# Patient Record
Sex: Female | Born: 1945 | Race: White | Hispanic: No | Marital: Married | Smoking: Former smoker
Health system: Southern US, Community
[De-identification: ages and names within clinical notes are randomized; demographics above are authoritative.]

## PROBLEM LIST (undated history)

## (undated) DIAGNOSIS — Z8719 Personal history of other diseases of the digestive system: Secondary | ICD-10-CM

## (undated) DIAGNOSIS — K635 Polyp of colon: Secondary | ICD-10-CM

## (undated) DIAGNOSIS — K5790 Diverticulosis of intestine, part unspecified, without perforation or abscess without bleeding: Secondary | ICD-10-CM

## (undated) DIAGNOSIS — B159 Hepatitis A without hepatic coma: Secondary | ICD-10-CM

## (undated) DIAGNOSIS — I4891 Unspecified atrial fibrillation: Secondary | ICD-10-CM

## (undated) DIAGNOSIS — K760 Fatty (change of) liver, not elsewhere classified: Secondary | ICD-10-CM

## (undated) DIAGNOSIS — M1990 Unspecified osteoarthritis, unspecified site: Secondary | ICD-10-CM

## (undated) DIAGNOSIS — I10 Essential (primary) hypertension: Secondary | ICD-10-CM

## (undated) DIAGNOSIS — K219 Gastro-esophageal reflux disease without esophagitis: Secondary | ICD-10-CM

## (undated) DIAGNOSIS — M109 Gout, unspecified: Secondary | ICD-10-CM

## (undated) DIAGNOSIS — E039 Hypothyroidism, unspecified: Secondary | ICD-10-CM

## (undated) DIAGNOSIS — K8020 Calculus of gallbladder without cholecystitis without obstruction: Secondary | ICD-10-CM

## (undated) DIAGNOSIS — E785 Hyperlipidemia, unspecified: Secondary | ICD-10-CM

## (undated) DIAGNOSIS — E669 Obesity, unspecified: Secondary | ICD-10-CM

## (undated) HISTORY — DX: Hypothyroidism, unspecified: E03.9

## (undated) HISTORY — PX: PLANTAR FASCIA SURGERY: SHX746

## (undated) HISTORY — PX: ERCP: SHX60

## (undated) HISTORY — DX: Calculus of gallbladder without cholecystitis without obstruction: K80.20

## (undated) HISTORY — DX: Hyperlipidemia, unspecified: E78.5

## (undated) HISTORY — DX: Diverticulosis of intestine, part unspecified, without perforation or abscess without bleeding: K57.90

## (undated) HISTORY — PX: TOENAIL EXCISION: SUR558

## (undated) HISTORY — PX: CHOLECYSTECTOMY: SHX55

## (undated) HISTORY — PX: TOTAL ABDOMINAL HYSTERECTOMY: SHX209

## (undated) HISTORY — PX: BREAST BIOPSY: SHX20

## (undated) HISTORY — DX: Obesity, unspecified: E66.9

## (undated) HISTORY — DX: Fatty (change of) liver, not elsewhere classified: K76.0

## (undated) HISTORY — DX: Unspecified atrial fibrillation: I48.91

## (undated) HISTORY — PX: TONSILLECTOMY: SUR1361

## (undated) HISTORY — DX: Essential (primary) hypertension: I10

## (undated) HISTORY — DX: Gout, unspecified: M10.9

## (undated) HISTORY — DX: Unspecified osteoarthritis, unspecified site: M19.90

## (undated) HISTORY — DX: Polyp of colon: K63.5

## (undated) HISTORY — DX: Hepatitis a without hepatic coma: B15.9

## (undated) HISTORY — DX: Gastro-esophageal reflux disease without esophagitis: K21.9

---

## 1963-09-13 HISTORY — PX: OTHER SURGICAL HISTORY: SHX169

## 1998-05-21 ENCOUNTER — Other Ambulatory Visit: Admission: RE | Admit: 1998-05-21 | Discharge: 1998-05-21 | Payer: Self-pay | Admitting: Internal Medicine

## 1999-08-13 ENCOUNTER — Other Ambulatory Visit: Admission: RE | Admit: 1999-08-13 | Discharge: 1999-08-13 | Payer: Self-pay | Admitting: Internal Medicine

## 1999-09-16 ENCOUNTER — Other Ambulatory Visit: Admission: RE | Admit: 1999-09-16 | Discharge: 1999-09-24 | Payer: Self-pay | Admitting: *Deleted

## 2000-06-06 ENCOUNTER — Encounter: Admission: RE | Admit: 2000-06-06 | Discharge: 2000-06-06 | Payer: Self-pay | Admitting: Occupational Medicine

## 2000-06-06 ENCOUNTER — Encounter: Payer: Self-pay | Admitting: Occupational Medicine

## 2000-07-24 ENCOUNTER — Encounter: Payer: Self-pay | Admitting: General Surgery

## 2000-07-24 ENCOUNTER — Encounter: Admission: RE | Admit: 2000-07-24 | Discharge: 2000-07-24 | Payer: Self-pay | Admitting: General Surgery

## 2000-08-02 ENCOUNTER — Other Ambulatory Visit: Admission: RE | Admit: 2000-08-02 | Discharge: 2000-08-02 | Payer: Self-pay | Admitting: Internal Medicine

## 2001-01-03 ENCOUNTER — Encounter: Admission: RE | Admit: 2001-01-03 | Discharge: 2001-01-03 | Payer: Self-pay | Admitting: General Surgery

## 2001-01-03 ENCOUNTER — Encounter: Payer: Self-pay | Admitting: General Surgery

## 2001-07-19 ENCOUNTER — Encounter: Payer: Self-pay | Admitting: General Surgery

## 2001-07-19 ENCOUNTER — Encounter: Admission: RE | Admit: 2001-07-19 | Discharge: 2001-07-19 | Payer: Self-pay | Admitting: General Surgery

## 2002-07-30 ENCOUNTER — Encounter: Admission: RE | Admit: 2002-07-30 | Discharge: 2002-07-30 | Payer: Self-pay | Admitting: Internal Medicine

## 2002-07-30 ENCOUNTER — Encounter: Payer: Self-pay | Admitting: Internal Medicine

## 2003-09-23 ENCOUNTER — Encounter: Admission: RE | Admit: 2003-09-23 | Discharge: 2003-09-23 | Payer: Self-pay | Admitting: General Surgery

## 2004-09-28 ENCOUNTER — Encounter: Admission: RE | Admit: 2004-09-28 | Discharge: 2004-09-28 | Payer: Self-pay | Admitting: General Surgery

## 2005-09-30 ENCOUNTER — Encounter: Admission: RE | Admit: 2005-09-30 | Discharge: 2005-09-30 | Payer: Self-pay | Admitting: General Surgery

## 2006-10-02 ENCOUNTER — Encounter: Admission: RE | Admit: 2006-10-02 | Discharge: 2006-10-02 | Payer: Self-pay | Admitting: Internal Medicine

## 2007-01-03 ENCOUNTER — Ambulatory Visit: Payer: Self-pay | Admitting: Internal Medicine

## 2007-01-08 ENCOUNTER — Ambulatory Visit: Payer: Self-pay | Admitting: Gastroenterology

## 2007-10-31 ENCOUNTER — Encounter: Admission: RE | Admit: 2007-10-31 | Discharge: 2007-10-31 | Payer: Self-pay | Admitting: Internal Medicine

## 2008-11-12 ENCOUNTER — Encounter: Admission: RE | Admit: 2008-11-12 | Discharge: 2008-11-12 | Payer: Self-pay | Admitting: Internal Medicine

## 2009-04-16 DIAGNOSIS — E039 Hypothyroidism, unspecified: Secondary | ICD-10-CM | POA: Insufficient documentation

## 2009-11-13 ENCOUNTER — Encounter: Admission: RE | Admit: 2009-11-13 | Discharge: 2009-11-13 | Payer: Self-pay | Admitting: Internal Medicine

## 2010-12-01 ENCOUNTER — Other Ambulatory Visit: Payer: Self-pay | Admitting: Internal Medicine

## 2010-12-01 DIAGNOSIS — Z1231 Encounter for screening mammogram for malignant neoplasm of breast: Secondary | ICD-10-CM

## 2010-12-09 ENCOUNTER — Ambulatory Visit
Admission: RE | Admit: 2010-12-09 | Discharge: 2010-12-09 | Disposition: A | Discharge status: Home / Self Care | Payer: Medicare Other | Source: Ambulatory Visit | Attending: Internal Medicine | Admitting: Internal Medicine

## 2010-12-09 DIAGNOSIS — Z1231 Encounter for screening mammogram for malignant neoplasm of breast: Secondary | ICD-10-CM

## 2010-12-30 ENCOUNTER — Encounter: Payer: Self-pay | Admitting: Cardiology

## 2010-12-30 DIAGNOSIS — E785 Hyperlipidemia, unspecified: Secondary | ICD-10-CM | POA: Insufficient documentation

## 2010-12-30 DIAGNOSIS — E669 Obesity, unspecified: Secondary | ICD-10-CM | POA: Insufficient documentation

## 2010-12-30 DIAGNOSIS — K219 Gastro-esophageal reflux disease without esophagitis: Secondary | ICD-10-CM | POA: Insufficient documentation

## 2010-12-30 DIAGNOSIS — I482 Chronic atrial fibrillation, unspecified: Secondary | ICD-10-CM | POA: Insufficient documentation

## 2010-12-30 DIAGNOSIS — I10 Essential (primary) hypertension: Secondary | ICD-10-CM | POA: Insufficient documentation

## 2011-01-03 ENCOUNTER — Ambulatory Visit (INDEPENDENT_AMBULATORY_CARE_PROVIDER_SITE_OTHER): Payer: Medicare Other | Admitting: Cardiology

## 2011-01-03 ENCOUNTER — Encounter: Payer: Self-pay | Admitting: Cardiology

## 2011-01-03 VITALS — BP 120/80 | HR 66 | Wt 266.0 lb

## 2011-01-03 DIAGNOSIS — E7800 Pure hypercholesterolemia, unspecified: Secondary | ICD-10-CM

## 2011-01-03 DIAGNOSIS — IMO0002 Reserved for concepts with insufficient information to code with codable children: Secondary | ICD-10-CM

## 2011-01-03 DIAGNOSIS — I4891 Unspecified atrial fibrillation: Secondary | ICD-10-CM

## 2011-01-03 DIAGNOSIS — I482 Chronic atrial fibrillation, unspecified: Secondary | ICD-10-CM

## 2011-01-03 DIAGNOSIS — I10 Essential (primary) hypertension: Secondary | ICD-10-CM

## 2011-01-03 NOTE — Patient Instructions (Signed)
Continue your current medication.  We will see you back in 1 year for a follow up visit.

## 2011-01-03 NOTE — Assessment & Plan Note (Signed)
Blood pressure is well-controlled on current therapy.

## 2011-01-03 NOTE — Assessment & Plan Note (Signed)
She is on appropriate therapy with pravastatin. Lipid levels are followed by Dr. Barney Drain.

## 2011-01-03 NOTE — Assessment & Plan Note (Signed)
She has chronic persistent atrial fibrillation. Her rate is well controlled and she is appropriately anticoagulated. Will continue with her current therapy and followup again in one year.

## 2011-01-03 NOTE — Progress Notes (Signed)
   [name] [name] Date of Birth: 1946/08/03   History of Present Illness: [name] [name] is seen for yearly followup with a history of chronic atrial fibrillation. She has been managed with rate control and chronic anticoagulation. She states she has done very well this past year. She denies any palpitations, dizziness, chest pain, or dyspnea. Dr. Karren Burly has been monitoring her Coumadin.  Current Outpatient Prescriptions on File Prior to Visit  Medication Sig Dispense Refill  . Cholecalciferol (VITAMIN D) 2000 UNITS CAPS Take by mouth daily.        Mariane Baumgarten Calcium (STOOL SOFTENER PO) Take by mouth 2 (two) times daily.        Marland Kitchen estropipate (OGEN) 0.75 MG tablet Take 0.75 mg by mouth daily.        Marland Kitchen levothyroxine (SYNTHROID, LEVOTHROID) 112 MCG tablet Take 112 mcg by mouth daily.        Marland Kitchen lisinopril-hydrochlorothiazide (PRINZIDE,ZESTORETIC) 20-12.5 MG per tablet Take 1 tablet by mouth 2 (two) times daily.        . methocarbamol (ROBAXIN) 500 MG tablet Take 500 mg by mouth 2 (two) times daily.        . pravastatin (PRAVACHOL) 40 MG tablet Take 40 mg by mouth daily.        . ranitidine (ZANTAC) 300 MG tablet Take 300 mg by mouth at bedtime.        Marland Kitchen warfarin (COUMADIN) 5 MG tablet Take 5 mg by mouth daily. AS DIRECTED         No Known Allergies  Past Medical History  Diagnosis Date  . Hypertension   . Dyslipidemia   . Obesity   . GERD (gastroesophageal reflux disease)   . AF (atrial fibrillation)     History reviewed. No pertinent past surgical history.  History  Smoking status  . Former Smoker  . Quit date: 09/12/1998  Smokeless tobacco  . Not on file    History  Alcohol Use No    Family History  Problem Relation Age of Onset  . Diabetes Mother   . Stroke Mother   . Heart disease Father   . Arrhythmia Sister   . Heart disease Sister     Review of Systems: All other systems were reviewed and are negative.  Physical Exam: BP 120/80  Pulse 66  Wt 266 lb  (120.657 kg) She is an obese white female in no acute distress. HEENT exam is unremarkable. She has no JVD or bruits. Lungs are clear. Cardiac exam reveals an irregular rate and rhythm without gallop, murmur, or click. She has no edema. Pedal pulses are good. LABORATORY DATA: ECG today demonstrates atrial fibrillation with a rate of 75 beats per minute. She has nonspecific T-wave abnormality.  Assessment / Plan:

## 2011-01-28 NOTE — Assessment & Plan Note (Signed)
[facility] OFFICE NOTE   NAME:[name] [name] [name]                        MRN:          [id number]  DATE:01/03/2007                            DOB:          14-Dec-1945    The patient is self referred.   REASON FOR CONSULTATION:  Recent problems with nausea and vomiting.  Concerned about recurrent common bile duct stone.   HISTORY:  This is a 65 year old white female with a history of  hypertension, hypothyroidism, degenerative joint disease, morbid  obesity, choledocholithiasis status post cholecystectomy, as well as  ERCP with sphincterotomy, gastroesophageal reflux disease, and chronic  elevation of hepatic transaminases, felt likely due to fatty liver  disease (after extensive previous workup).  She has not been seen in  this office for over 4 years.  She was last seen in early 2004 regarding  reflux disease, heme-occult positive stool and elevated liver test.  See  that dictation for details.  The last upper endoscopy revealed mild  esophagitis.  Her last colonoscopy revealed hyperplastic polyp and mild  diverticulosis only.  She reports that she has been in her usual state  of health, until Easter when she developed problems with nausea and  vomiting lasting one to two days.  She saw Dr. Shon Baton and was given  a shot of Phenergan.  This helped.  Over the next week, she felt sort of  fatigued and washed out.  She was concerned that maybe this represents  problems with recurrent common duct stone, as she had some problems with  nausea and vomiting at that time.  However, during the course of this  particular problem, she denied fever, jaundice, darkening of urine or  any abdominal pain.  She had no diarrhea with the illness.  No bleeding.  The past several weeks she has been fine without symptoms.  Her reflux  disease is well controlled with Nexium.  She did have chronic  constipation for which she takes  stool softener.   PAST MEDICAL HISTORY:  As above.   PAST SURGICAL HISTORY:  1. Cholecystectomy  2. Hysterectomy.  3. Left knee surgery.  4. Cervical cyst removal.  5. Tonsillectomy.  6. Left foot surgery.   ALLERGIES:  UNSPECIFIED itching pill which causes drowsiness, also  Band-Aids.   CURRENT MEDICATIONS:  1. Lisinopril/hydrochlorothiazide 1 p.o. b.i.d.  2. Premarin 1.25 mg daily.  3. Stool softener.  4. Multi-vitamin.  5. Nexium 40 mg daily.  6. Synthroid 112 mcg daily.  7. Unspecified cholesterol-lowering agent.   FAMILY HISTORY:  Negative for gastrointestinal malignancy or liver  disease.   SOCIAL HISTORY:  Patient is married without children, has a high school  education, lives with her husband, does not smoke or use alcohol.   REVIEW OF SYSTEMS:  Per diagnostic evaluation performed.   PHYSICAL EXAMINATION:  GENERAL:  Patient is alert and oriented and in no  acute distress.  VITAL SIGNS:  Her blood pressure is 146/70, heart rate is 52 with an  occasional irregular beat.  Her weight is 264.8 pounds.  She is 5 feet 5  inches  in height.  HEENT:  Sclerae anicteric, conjunctivae are pink, oral mucosa intact, no  adenopathy.  LUNGS:  Clear.  HEART:  Regular.  ABDOMEN:  Obese and soft without tenderness, mass or hernia.  Good bowel  sounds heard.  EXTREMITIES:  Without edema.   LABORATORY:  Blood work obtained November 17, 2006, revealed essentially  normal comprehensive metabolic and CBC.  As well, her lipids were  unremarkable.   IMPRESSION:  1. Prior problems with acute nausea and vomiting, most likely viral      gastroenteritis, resolved.  2. History of elevated liver function test, presumably due to fatty      liver.  Extensive previous workup.Recent Liver tests were normal.  3. Gastroesophageal reflux disease.  Asymptomatic, on Nexium.  4. History of choledocholithiasis.  Doubt this represents a      recurrence,though the patient is concerned.    RECOMMENDATIONS:  1. Schedule abdominal  ultrasound to rule out biliary dilation or      recurrent stone.  If the exam is negative, then follow expectantly.      She will return to the general medical care of Dr. Osborne Casco.     Docia Chuck. Henrene Pastor, MD  Electronically Signed    JNP/MedQ  DD: 01/03/2007  DT: 01/03/2007  Job #: [id number]   cc:   [name] [name], M.D.

## 2011-11-04 ENCOUNTER — Other Ambulatory Visit: Payer: Self-pay | Admitting: Internal Medicine

## 2011-11-04 DIAGNOSIS — Z1231 Encounter for screening mammogram for malignant neoplasm of breast: Secondary | ICD-10-CM

## 2011-12-12 ENCOUNTER — Ambulatory Visit
Admission: RE | Admit: 2011-12-12 | Discharge: 2011-12-12 | Disposition: A | Discharge status: Home / Self Care | Payer: Medicare Other | Source: Ambulatory Visit | Attending: Internal Medicine | Admitting: Internal Medicine

## 2011-12-12 DIAGNOSIS — Z1231 Encounter for screening mammogram for malignant neoplasm of breast: Secondary | ICD-10-CM

## 2011-12-13 ENCOUNTER — Other Ambulatory Visit: Payer: Self-pay | Admitting: Internal Medicine

## 2011-12-13 DIAGNOSIS — R928 Other abnormal and inconclusive findings on diagnostic imaging of breast: Secondary | ICD-10-CM

## 2011-12-16 ENCOUNTER — Ambulatory Visit
Admission: RE | Admit: 2011-12-16 | Discharge: 2011-12-16 | Disposition: A | Discharge status: Home / Self Care | Payer: Medicare Other | Source: Ambulatory Visit | Attending: Internal Medicine | Admitting: Internal Medicine

## 2011-12-16 DIAGNOSIS — R928 Other abnormal and inconclusive findings on diagnostic imaging of breast: Secondary | ICD-10-CM

## 2011-12-27 ENCOUNTER — Encounter: Payer: Self-pay | Admitting: Cardiology

## 2011-12-29 ENCOUNTER — Encounter: Payer: Self-pay | Admitting: Cardiology

## 2011-12-29 ENCOUNTER — Ambulatory Visit (INDEPENDENT_AMBULATORY_CARE_PROVIDER_SITE_OTHER): Payer: Medicare Other | Admitting: Cardiology

## 2011-12-29 VITALS — BP 130/72 | HR 75 | Ht 65.0 in | Wt 264.0 lb

## 2011-12-29 DIAGNOSIS — Z7901 Long term (current) use of anticoagulants: Secondary | ICD-10-CM

## 2011-12-29 DIAGNOSIS — I10 Essential (primary) hypertension: Secondary | ICD-10-CM

## 2011-12-29 DIAGNOSIS — I4891 Unspecified atrial fibrillation: Secondary | ICD-10-CM

## 2011-12-29 NOTE — Assessment & Plan Note (Signed)
She has permanent atrial fibrillation. Her rate is controlled and she is completely asymptomatic. Continue on her current therapy with anticoagulation on Coumadin. We did discuss the newer anticoagulants but given how well she has been on Coumadin I see no reason to change.

## 2011-12-29 NOTE — Assessment & Plan Note (Signed)
Well-controlled on current medications 

## 2011-12-29 NOTE — Progress Notes (Signed)
   [name] [name] Date of Birth: 1945-10-13   History of Present Illness: [name] [name] is seen for yearly followup. Rate control and anticoagulation with Coumadin. He states that her INRs are always perfect. She denies any bleeding problems. He has had no TIA or CVA symptoms. Denies any palpitations, dizziness, shortness of breath, or chest pain. She is planning to start a weight watchers program about a week.  Current Outpatient Prescriptions on File Prior to Visit  Medication Sig Dispense Refill  . Cholecalciferol (VITAMIN D) 2000 UNITS CAPS Take by mouth daily.        Mariane Baumgarten Calcium (STOOL SOFTENER PO) Take by mouth 2 (two) times daily.        Marland Kitchen estropipate (OGEN) 0.75 MG tablet Take 0.75 mg by mouth daily.        Marland Kitchen levothyroxine (SYNTHROID, LEVOTHROID) 112 MCG tablet Take 112 mcg by mouth daily.        Marland Kitchen lisinopril-hydrochlorothiazide (PRINZIDE,ZESTORETIC) 20-12.5 MG per tablet Take 1 tablet by mouth 2 (two) times daily.        . methocarbamol (ROBAXIN) 500 MG tablet Take 500 mg by mouth 2 (two) times daily.        . pravastatin (PRAVACHOL) 40 MG tablet Take 40 mg by mouth daily.        . ranitidine (ZANTAC) 300 MG tablet Take 300 mg by mouth at bedtime.        Marland Kitchen warfarin (COUMADIN) 5 MG tablet Take 5 mg by mouth daily. AS DIRECTED         No Known Allergies  Past Medical History  Diagnosis Date  . Hypertension   . Dyslipidemia   . Obesity   . GERD (gastroesophageal reflux disease)   . AF (atrial fibrillation)     History reviewed. No pertinent past surgical history.  History  Smoking status  . Former Smoker  . Quit date: 09/12/1998  Smokeless tobacco  . Not on file    History  Alcohol Use No    Family History  Problem Relation Age of Onset  . Diabetes Mother   . Stroke Mother   . Heart disease Father   . Arrhythmia Sister   . Heart disease Sister     Review of Systems: As noted in history of present illness. All other systems were reviewed and are  negative.  Physical Exam: BP 130/72  Pulse 75  Ht 5\' 5"  (1.651 m)  Wt 264 lb (119.75 kg)  BMI 43.93 kg/m2 She is an obese white female in no acute distress. HEENT exam is unremarkable. She has no JVD or bruits. Lungs are clear. Cardiac exam reveals an irregular rate and rhythm without gallop, murmur, or click. She has no edema. Pedal pulses are good. LABORATORY DATA: ECG today demonstrates atrial fibrillation with a rate of 75 beats per minute. There is low QRS voltage. She has nonspecific T-wave abnormality.  Assessment / Plan:

## 2011-12-29 NOTE — Patient Instructions (Signed)
Continue your current therapy.  I will see you in 1 year.

## 2012-05-08 ENCOUNTER — Other Ambulatory Visit: Payer: Self-pay | Admitting: Internal Medicine

## 2012-05-08 DIAGNOSIS — R921 Mammographic calcification found on diagnostic imaging of breast: Secondary | ICD-10-CM

## 2012-06-07 ENCOUNTER — Other Ambulatory Visit: Payer: Self-pay | Admitting: Surgery

## 2012-06-18 ENCOUNTER — Encounter: Payer: Self-pay | Admitting: *Deleted

## 2012-06-20 ENCOUNTER — Other Ambulatory Visit: Payer: Self-pay | Admitting: Internal Medicine

## 2012-06-20 ENCOUNTER — Ambulatory Visit
Admission: RE | Admit: 2012-06-20 | Discharge: 2012-06-20 | Disposition: A | Discharge status: Home / Self Care | Payer: Medicare Other | Source: Ambulatory Visit | Attending: Internal Medicine | Admitting: Internal Medicine

## 2012-06-20 DIAGNOSIS — R921 Mammographic calcification found on diagnostic imaging of breast: Secondary | ICD-10-CM

## 2012-06-29 ENCOUNTER — Ambulatory Visit
Admission: RE | Admit: 2012-06-29 | Discharge: 2012-06-29 | Disposition: A | Discharge status: Home / Self Care | Payer: Medicare Other | Source: Ambulatory Visit | Attending: Internal Medicine | Admitting: Internal Medicine

## 2012-06-29 ENCOUNTER — Other Ambulatory Visit: Payer: Self-pay | Admitting: Internal Medicine

## 2012-06-29 DIAGNOSIS — R921 Mammographic calcification found on diagnostic imaging of breast: Secondary | ICD-10-CM

## 2012-11-23 ENCOUNTER — Other Ambulatory Visit: Payer: Self-pay | Admitting: Internal Medicine

## 2012-11-23 DIAGNOSIS — R921 Mammographic calcification found on diagnostic imaging of breast: Secondary | ICD-10-CM

## 2012-12-18 ENCOUNTER — Ambulatory Visit
Admission: RE | Admit: 2012-12-18 | Discharge: 2012-12-18 | Disposition: A | Discharge status: Home / Self Care | Payer: Medicare Other | Source: Ambulatory Visit | Attending: Internal Medicine | Admitting: Internal Medicine

## 2012-12-18 DIAGNOSIS — R921 Mammographic calcification found on diagnostic imaging of breast: Secondary | ICD-10-CM

## 2012-12-21 ENCOUNTER — Other Ambulatory Visit: Payer: Self-pay | Admitting: Surgery

## 2013-03-20 ENCOUNTER — Encounter: Payer: Self-pay | Admitting: Cardiology

## 2013-03-20 ENCOUNTER — Ambulatory Visit (INDEPENDENT_AMBULATORY_CARE_PROVIDER_SITE_OTHER): Payer: Medicare Other | Admitting: Cardiology

## 2013-03-20 VITALS — BP 140/70 | HR 69 | Ht 65.0 in | Wt 239.0 lb

## 2013-03-20 DIAGNOSIS — I4891 Unspecified atrial fibrillation: Secondary | ICD-10-CM

## 2013-03-20 DIAGNOSIS — E785 Hyperlipidemia, unspecified: Secondary | ICD-10-CM

## 2013-03-20 DIAGNOSIS — I10 Essential (primary) hypertension: Secondary | ICD-10-CM

## 2013-03-20 NOTE — Patient Instructions (Signed)
Continue your current therapy  I will see you in one year   

## 2013-03-20 NOTE — Progress Notes (Signed)
   [name] [name] Date of Birth: July 06, 1946   History of Present Illness: [name] [name] is seen for yearly followup. She has a history of permanent atrial fibrillation treated with rate control and anticoagulation with Coumadin. In fact she has not required any rate control medication. She denies any symptoms of chest pain, shortness of breath, palpitations, or dizziness. She has had no TIA or CVA symptoms. She is exercising regularly and has lost 35 pounds this past year.  Current Outpatient Prescriptions on File Prior to Visit  Medication Sig Dispense Refill  . Cholecalciferol (VITAMIN D) 2000 UNITS CAPS Take by mouth daily.        Mariane Baumgarten Calcium (STOOL SOFTENER PO) Take by mouth 2 (two) times daily.        Marland Kitchen levothyroxine (SYNTHROID, LEVOTHROID) 112 MCG tablet Take 112 mcg by mouth daily.        Marland Kitchen lisinopril-hydrochlorothiazide (PRINZIDE,ZESTORETIC) 20-12.5 MG per tablet Take 1 tablet by mouth 2 (two) times daily.        . methocarbamol (ROBAXIN) 500 MG tablet Take 500 mg by mouth 2 (two) times daily.        . pravastatin (PRAVACHOL) 40 MG tablet Take 40 mg by mouth daily.        Marland Kitchen warfarin (COUMADIN) 5 MG tablet Take 5 mg by mouth daily. AS DIRECTED        No current facility-administered medications on file prior to visit.    No Known Allergies  Past Medical History  Diagnosis Date  . Hypertension   . Dyslipidemia   . Obesity   . GERD (gastroesophageal reflux disease)   . AF (atrial fibrillation)     History reviewed. No pertinent past surgical history.  History  Smoking status  . Former Smoker  . Quit date: 09/12/1998  Smokeless tobacco  . Not on file    History  Alcohol Use No    Family History  Problem Relation Age of Onset  . Diabetes Mother   . Stroke Mother   . Heart disease Father   . Arrhythmia Sister   . Heart disease Sister     Review of Systems: As noted in history of present illness. All other systems were reviewed and are negative.  Physical  Exam: BP 140/70  Pulse 69  Ht 5\' 5"  (1.651 m)  Wt 239 lb (108.41 kg)  BMI 39.77 kg/m2 She is an obese white female in no acute distress. HEENT exam is unremarkable. She has no JVD or bruits. Lungs are clear. Cardiac exam reveals an irregular rate and rhythm without gallop, murmur, or click. She has no edema. Pedal pulses are good. LABORATORY DATA: ECG today demonstrates atrial fibrillation with a rate of 69 beats per minute. There is low QRS voltage.  Laboratory data from 01/14/2013 shows a normal chemistry panel except for an ALT of 42. Creatinine is 1.3. CBC is normal. Cholesterol 126, triglycerides 75, HDL 46, LDL 65. Thyroid studies are normal.  Assessment / Plan: 1. Atrial fibrillation. Rate is well controlled on no medication. She is on anticoagulation with Coumadin. We will continue her current therapy. She is asymptomatic.  2. Morbid obesity. Very encouraged by her weight loss.  3. Hyperlipidemia-well-controlled on pravastatin.

## 2013-04-17 ENCOUNTER — Other Ambulatory Visit: Payer: Self-pay

## 2013-07-18 ENCOUNTER — Other Ambulatory Visit: Payer: Self-pay

## 2013-09-12 HISTORY — PX: EYE SURGERY: SHX253

## 2014-03-21 ENCOUNTER — Encounter: Payer: Self-pay | Admitting: Cardiology

## 2014-03-21 ENCOUNTER — Ambulatory Visit (INDEPENDENT_AMBULATORY_CARE_PROVIDER_SITE_OTHER): Payer: Commercial Managed Care - HMO | Admitting: Cardiology

## 2014-03-21 VITALS — BP 120/62 | HR 47 | Ht 65.0 in | Wt 209.4 lb

## 2014-03-21 DIAGNOSIS — I4891 Unspecified atrial fibrillation: Secondary | ICD-10-CM

## 2014-03-21 DIAGNOSIS — I10 Essential (primary) hypertension: Secondary | ICD-10-CM

## 2014-03-21 DIAGNOSIS — E785 Hyperlipidemia, unspecified: Secondary | ICD-10-CM

## 2014-03-21 DIAGNOSIS — E669 Obesity, unspecified: Secondary | ICD-10-CM

## 2014-03-21 DIAGNOSIS — I482 Chronic atrial fibrillation, unspecified: Secondary | ICD-10-CM

## 2014-03-21 NOTE — Progress Notes (Signed)
[name] Date of Birth: [birth year]   History of Present Illness: Mrs. Garczynski is seen for yearly followup. She has a history of permanent atrial fibrillation treated with  anticoagulation with Coumadin. No need for rate control therapy since HR has been normal. She denies any symptoms of chest pain, shortness of breath, palpitations, or dizziness. She has had no TIA or CVA symptoms. She is exercising regularly and has lost 30 pounds this past year. She is also in Weight watchers.  Current Outpatient Prescriptions on File Prior to Visit  Medication Sig Dispense Refill  . allopurinol (ZYLOPRIM) 300 MG tablet Take 300 mg by mouth daily.       . Cholecalciferol (VITAMIN D) 2000 UNITS CAPS Take by mouth daily.        . clobetasol ointment (TEMOVATE) 0.05 % Apply topically.       . fexofenadine (ALLEGRA) 180 MG tablet Take 180 mg by mouth daily.      Marland Kitchen lisinopril-hydrochlorothiazide (PRINZIDE,ZESTORETIC) 20-12.5 MG per tablet Take 1 tablet by mouth 2 (two) times daily.        . methocarbamol (ROBAXIN) 500 MG tablet Take 500 mg by mouth 2 (two) times daily.        Marland Kitchen omeprazole (PRILOSEC) 20 MG capsule Take 20 mg by mouth daily.       . pravastatin (PRAVACHOL) 40 MG tablet Take 40 mg by mouth daily.        Marland Kitchen warfarin (COUMADIN) 5 MG tablet Take 5 mg by mouth daily. AS DIRECTED        No current facility-administered medications on file prior to visit.    No Known Allergies  Past Medical History  Diagnosis Date  . Hypertension   . Dyslipidemia   . Obesity   . GERD (gastroesophageal reflux disease)   . AF (atrial fibrillation)     History reviewed. No pertinent past surgical history.  History  Smoking status  . Former Smoker  . Quit date: 09/12/1998  Smokeless tobacco  . Not on file    History  Alcohol Use No    Family History  Problem Relation Age of Onset  . Diabetes Mother   . Stroke Mother   . Heart disease Father   . Arrhythmia Sister   . Heart disease Sister      Review of Systems: As noted in history of present illness. All other systems were reviewed and are negative.  Physical Exam: BP 120/62  Pulse 47  Ht '5\' 5"'  (1.651 m)  Wt 209 lb 6.4 oz (94.983 kg)  BMI 34.85 kg/m2 She is an obese white female in no acute distress. HEENT exam is unremarkable. She has no JVD or bruits. Lungs are clear. Cardiac exam reveals an irregular rate and rhythm without gallop, murmur, or click. She has no edema. Pedal pulses are good.  LABORATORY DATA: ECG today demonstrates atrial fibrillation with a rate of 47 beats per minute. There is low QRS voltage. Otherwise normal.   Laboratory data from 01/16/14 shows a normal chemistry panel except for an ALT of 41. Alk phos 162. CBC is normal. Cholesterol 137, triglycerides 50, HDL 55, LDL 72. Thyroid studies are normal.  Assessment / Plan: 1. Atrial fibrillation. Rate is slow on no medication. She is on anticoagulation with Coumadin.  She is asymptomatic. We will have her wear a 24 hour Holter monitor to evaluate bradycardia. She is asked to report any symptoms of dizziness, syncope, or fatigue.   2. Morbid obesity. Very encouraged  by her weight loss.  3. Hyperlipidemia-well-controlled on pravastatin.

## 2014-03-21 NOTE — Patient Instructions (Signed)
We will have you wear a monitor for 24 hours  Call if you notice dizziness, lightheadedness, blacking out, or increased fatigue  I will see you in 6 months

## 2014-04-21 ENCOUNTER — Telehealth: Payer: Self-pay

## 2014-04-21 NOTE — Telephone Encounter (Signed)
Patient called Dr.Jordan reviewed 24 hour holter monitor which revealed atrial fib with controlled rate.Advised to continue same medications.

## 2014-04-22 ENCOUNTER — Encounter: Payer: Self-pay | Admitting: Cardiology

## 2014-09-03 ENCOUNTER — Emergency Department (HOSPITAL_COMMUNITY)
Admission: EM | Admit: 2014-09-03 | Discharge: 2014-09-03 | Disposition: A | Discharge status: Home / Self Care | Payer: No Typology Code available for payment source | Attending: Emergency Medicine | Admitting: Emergency Medicine

## 2014-09-03 ENCOUNTER — Encounter (HOSPITAL_COMMUNITY): Payer: Self-pay | Admitting: Family Medicine

## 2014-09-03 DIAGNOSIS — Z87891 Personal history of nicotine dependence: Secondary | ICD-10-CM | POA: Insufficient documentation

## 2014-09-03 DIAGNOSIS — Z041 Encounter for examination and observation following transport accident: Secondary | ICD-10-CM | POA: Insufficient documentation

## 2014-09-03 DIAGNOSIS — Y9389 Activity, other specified: Secondary | ICD-10-CM | POA: Insufficient documentation

## 2014-09-03 DIAGNOSIS — Z79899 Other long term (current) drug therapy: Secondary | ICD-10-CM | POA: Insufficient documentation

## 2014-09-03 DIAGNOSIS — E669 Obesity, unspecified: Secondary | ICD-10-CM | POA: Diagnosis not present

## 2014-09-03 DIAGNOSIS — Y998 Other external cause status: Secondary | ICD-10-CM | POA: Insufficient documentation

## 2014-09-03 DIAGNOSIS — Y92410 Unspecified street and highway as the place of occurrence of the external cause: Secondary | ICD-10-CM | POA: Diagnosis not present

## 2014-09-03 DIAGNOSIS — I4891 Unspecified atrial fibrillation: Secondary | ICD-10-CM | POA: Insufficient documentation

## 2014-09-03 DIAGNOSIS — I10 Essential (primary) hypertension: Secondary | ICD-10-CM | POA: Insufficient documentation

## 2014-09-03 DIAGNOSIS — E785 Hyperlipidemia, unspecified: Secondary | ICD-10-CM | POA: Insufficient documentation

## 2014-09-03 DIAGNOSIS — K219 Gastro-esophageal reflux disease without esophagitis: Secondary | ICD-10-CM | POA: Insufficient documentation
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
## 2014-09-15 DIAGNOSIS — S62634A Displaced fracture of distal phalanx of right ring finger, initial encounter for closed fracture: Secondary | ICD-10-CM | POA: Diagnosis not present

## 2014-09-25 ENCOUNTER — Encounter: Payer: Self-pay | Admitting: Cardiology

## 2014-09-25 ENCOUNTER — Ambulatory Visit (INDEPENDENT_AMBULATORY_CARE_PROVIDER_SITE_OTHER): Payer: Commercial Managed Care - HMO | Admitting: Cardiology

## 2014-09-25 VITALS — BP 104/52 | HR 72 | Ht 65.0 in | Wt 206.5 lb

## 2014-09-25 DIAGNOSIS — I482 Chronic atrial fibrillation, unspecified: Secondary | ICD-10-CM

## 2014-09-25 DIAGNOSIS — E7800 Pure hypercholesterolemia, unspecified: Secondary | ICD-10-CM

## 2014-09-25 DIAGNOSIS — E785 Hyperlipidemia, unspecified: Secondary | ICD-10-CM | POA: Diagnosis not present

## 2014-09-25 DIAGNOSIS — M6530 Trigger finger, unspecified finger: Secondary | ICD-10-CM | POA: Insufficient documentation

## 2014-09-25 DIAGNOSIS — I10 Essential (primary) hypertension: Secondary | ICD-10-CM

## 2014-09-25 NOTE — Patient Instructions (Signed)
Continue your current therapy  I will see you in one year   
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
## 2014-09-29 DIAGNOSIS — M79644 Pain in right finger(s): Secondary | ICD-10-CM | POA: Diagnosis not present

## 2014-10-13 DIAGNOSIS — M79641 Pain in right hand: Secondary | ICD-10-CM | POA: Diagnosis not present

## 2014-10-16 DIAGNOSIS — Z7901 Long term (current) use of anticoagulants: Secondary | ICD-10-CM | POA: Diagnosis not present

## 2014-10-16 DIAGNOSIS — I482 Chronic atrial fibrillation: Secondary | ICD-10-CM | POA: Diagnosis not present

## 2014-10-20 DIAGNOSIS — M79641 Pain in right hand: Secondary | ICD-10-CM | POA: Diagnosis not present

## 2014-10-28 DIAGNOSIS — M79644 Pain in right finger(s): Secondary | ICD-10-CM | POA: Diagnosis not present

## 2014-10-28 DIAGNOSIS — S62636D Displaced fracture of distal phalanx of right little finger, subsequent encounter for fracture with routine healing: Secondary | ICD-10-CM | POA: Diagnosis not present

## 2014-11-20 DIAGNOSIS — Z7901 Long term (current) use of anticoagulants: Secondary | ICD-10-CM | POA: Diagnosis not present

## 2014-11-20 DIAGNOSIS — I482 Chronic atrial fibrillation: Secondary | ICD-10-CM | POA: Diagnosis not present

## 2014-11-24 DIAGNOSIS — L821 Other seborrheic keratosis: Secondary | ICD-10-CM | POA: Diagnosis not present

## 2014-11-24 DIAGNOSIS — D1801 Hemangioma of skin and subcutaneous tissue: Secondary | ICD-10-CM | POA: Diagnosis not present

## 2014-11-24 DIAGNOSIS — D225 Melanocytic nevi of trunk: Secondary | ICD-10-CM | POA: Diagnosis not present

## 2014-11-24 DIAGNOSIS — L814 Other melanin hyperpigmentation: Secondary | ICD-10-CM | POA: Diagnosis not present

## 2014-12-08 DIAGNOSIS — Z6834 Body mass index (BMI) 34.0-34.9, adult: Secondary | ICD-10-CM | POA: Diagnosis not present

## 2014-12-08 DIAGNOSIS — L723 Sebaceous cyst: Secondary | ICD-10-CM | POA: Diagnosis not present

## 2014-12-30 DIAGNOSIS — Z7901 Long term (current) use of anticoagulants: Secondary | ICD-10-CM | POA: Diagnosis not present

## 2014-12-30 DIAGNOSIS — I482 Chronic atrial fibrillation: Secondary | ICD-10-CM | POA: Diagnosis not present

## 2015-01-27 DIAGNOSIS — Z1231 Encounter for screening mammogram for malignant neoplasm of breast: Secondary | ICD-10-CM | POA: Diagnosis not present

## 2015-01-27 DIAGNOSIS — Z803 Family history of malignant neoplasm of breast: Secondary | ICD-10-CM | POA: Diagnosis not present

## 2015-01-28 DIAGNOSIS — Z7901 Long term (current) use of anticoagulants: Secondary | ICD-10-CM | POA: Diagnosis not present

## 2015-01-28 DIAGNOSIS — I482 Chronic atrial fibrillation: Secondary | ICD-10-CM | POA: Diagnosis not present

## 2015-02-19 DIAGNOSIS — R8290 Unspecified abnormal findings in urine: Secondary | ICD-10-CM | POA: Diagnosis not present

## 2015-02-19 DIAGNOSIS — E039 Hypothyroidism, unspecified: Secondary | ICD-10-CM | POA: Diagnosis not present

## 2015-02-19 DIAGNOSIS — M109 Gout, unspecified: Secondary | ICD-10-CM | POA: Diagnosis not present

## 2015-02-19 DIAGNOSIS — I10 Essential (primary) hypertension: Secondary | ICD-10-CM | POA: Diagnosis not present

## 2015-02-25 DIAGNOSIS — Z1212 Encounter for screening for malignant neoplasm of rectum: Secondary | ICD-10-CM | POA: Diagnosis not present

## 2015-02-28 DIAGNOSIS — I77819 Aortic ectasia, unspecified site: Secondary | ICD-10-CM | POA: Insufficient documentation

## 2015-02-28 DIAGNOSIS — D692 Other nonthrombocytopenic purpura: Secondary | ICD-10-CM | POA: Insufficient documentation

## 2015-03-05 DIAGNOSIS — M109 Gout, unspecified: Secondary | ICD-10-CM | POA: Diagnosis not present

## 2015-03-05 DIAGNOSIS — L309 Dermatitis, unspecified: Secondary | ICD-10-CM | POA: Diagnosis not present

## 2015-03-05 DIAGNOSIS — E669 Obesity, unspecified: Secondary | ICD-10-CM | POA: Diagnosis not present

## 2015-03-05 DIAGNOSIS — Z0000 Encounter for general adult medical examination without abnormal findings: Secondary | ICD-10-CM | POA: Diagnosis not present

## 2015-03-05 DIAGNOSIS — M65342 Trigger finger, left ring finger: Secondary | ICD-10-CM | POA: Diagnosis not present

## 2015-03-05 DIAGNOSIS — I77810 Thoracic aortic ectasia: Secondary | ICD-10-CM | POA: Diagnosis not present

## 2015-03-05 DIAGNOSIS — D692 Other nonthrombocytopenic purpura: Secondary | ICD-10-CM | POA: Diagnosis not present

## 2015-03-05 DIAGNOSIS — N183 Chronic kidney disease, stage 3 (moderate): Secondary | ICD-10-CM | POA: Diagnosis not present

## 2015-04-03 ENCOUNTER — Encounter: Payer: Self-pay | Admitting: Internal Medicine

## 2015-04-07 DIAGNOSIS — I482 Chronic atrial fibrillation: Secondary | ICD-10-CM | POA: Diagnosis not present

## 2015-04-07 DIAGNOSIS — Z7901 Long term (current) use of anticoagulants: Secondary | ICD-10-CM | POA: Diagnosis not present

## 2015-04-16 DIAGNOSIS — H524 Presbyopia: Secondary | ICD-10-CM | POA: Diagnosis not present

## 2015-04-16 DIAGNOSIS — H5210 Myopia, unspecified eye: Secondary | ICD-10-CM | POA: Diagnosis not present

## 2015-05-14 DIAGNOSIS — Z7901 Long term (current) use of anticoagulants: Secondary | ICD-10-CM | POA: Diagnosis not present

## 2015-05-14 DIAGNOSIS — I482 Chronic atrial fibrillation: Secondary | ICD-10-CM | POA: Diagnosis not present

## 2015-06-17 DIAGNOSIS — Z7901 Long term (current) use of anticoagulants: Secondary | ICD-10-CM | POA: Diagnosis not present

## 2015-06-17 DIAGNOSIS — Z23 Encounter for immunization: Secondary | ICD-10-CM | POA: Diagnosis not present

## 2015-06-17 DIAGNOSIS — I482 Chronic atrial fibrillation: Secondary | ICD-10-CM | POA: Diagnosis not present

## 2015-07-01 DIAGNOSIS — Z7901 Long term (current) use of anticoagulants: Secondary | ICD-10-CM | POA: Diagnosis not present

## 2015-07-09 ENCOUNTER — Telehealth: Payer: Self-pay | Admitting: Cardiology

## 2015-07-09 NOTE — Telephone Encounter (Signed)
Close encounter 

## 2015-07-17 DIAGNOSIS — Z7901 Long term (current) use of anticoagulants: Secondary | ICD-10-CM | POA: Diagnosis not present

## 2015-07-22 DIAGNOSIS — G4730 Sleep apnea, unspecified: Secondary | ICD-10-CM | POA: Insufficient documentation

## 2015-08-12 DIAGNOSIS — I482 Chronic atrial fibrillation: Secondary | ICD-10-CM | POA: Diagnosis not present

## 2015-08-12 DIAGNOSIS — Z7901 Long term (current) use of anticoagulants: Secondary | ICD-10-CM | POA: Diagnosis not present

## 2015-08-27 DIAGNOSIS — I10 Essential (primary) hypertension: Secondary | ICD-10-CM | POA: Diagnosis not present

## 2015-08-27 DIAGNOSIS — K769 Liver disease, unspecified: Secondary | ICD-10-CM | POA: Diagnosis not present

## 2015-08-27 DIAGNOSIS — E668 Other obesity: Secondary | ICD-10-CM | POA: Diagnosis not present

## 2015-08-27 DIAGNOSIS — Z7901 Long term (current) use of anticoagulants: Secondary | ICD-10-CM | POA: Diagnosis not present

## 2015-08-27 DIAGNOSIS — K219 Gastro-esophageal reflux disease without esophagitis: Secondary | ICD-10-CM | POA: Diagnosis not present

## 2015-08-27 DIAGNOSIS — N183 Chronic kidney disease, stage 3 (moderate): Secondary | ICD-10-CM | POA: Diagnosis not present

## 2015-08-27 DIAGNOSIS — M109 Gout, unspecified: Secondary | ICD-10-CM | POA: Diagnosis not present

## 2015-08-27 DIAGNOSIS — G4730 Sleep apnea, unspecified: Secondary | ICD-10-CM | POA: Diagnosis not present

## 2015-09-16 DIAGNOSIS — Z7901 Long term (current) use of anticoagulants: Secondary | ICD-10-CM | POA: Diagnosis not present

## 2015-09-16 DIAGNOSIS — I482 Chronic atrial fibrillation: Secondary | ICD-10-CM | POA: Diagnosis not present

## 2015-09-30 DIAGNOSIS — Z1389 Encounter for screening for other disorder: Secondary | ICD-10-CM | POA: Diagnosis not present

## 2015-09-30 DIAGNOSIS — Z6835 Body mass index (BMI) 35.0-35.9, adult: Secondary | ICD-10-CM | POA: Diagnosis not present

## 2015-09-30 DIAGNOSIS — K219 Gastro-esophageal reflux disease without esophagitis: Secondary | ICD-10-CM | POA: Diagnosis not present

## 2015-09-30 DIAGNOSIS — R109 Unspecified abdominal pain: Secondary | ICD-10-CM | POA: Diagnosis not present

## 2015-10-16 ENCOUNTER — Encounter: Payer: Self-pay | Admitting: Cardiology

## 2015-10-16 ENCOUNTER — Ambulatory Visit (INDEPENDENT_AMBULATORY_CARE_PROVIDER_SITE_OTHER): Payer: Commercial Managed Care - HMO | Admitting: Cardiology

## 2015-10-16 VITALS — BP 120/58 | HR 73 | Ht 65.0 in | Wt 215.7 lb

## 2015-10-16 DIAGNOSIS — I482 Chronic atrial fibrillation, unspecified: Secondary | ICD-10-CM

## 2015-10-16 DIAGNOSIS — E785 Hyperlipidemia, unspecified: Secondary | ICD-10-CM | POA: Diagnosis not present

## 2015-10-16 DIAGNOSIS — I10 Essential (primary) hypertension: Secondary | ICD-10-CM | POA: Diagnosis not present

## 2015-10-16 NOTE — Progress Notes (Signed)
[name] [name] Date of Birth: April 23, 1946   History of Present Illness: [name] is seen for  followup of atrial fibrillation. She has a history of permanent atrial fibrillation treated with  anticoagulation with Coumadin. No need for rate control therapy since HR has been normal. Prior Holter monitor showed average HR 72. Range 38-139. Longest pause 2.5 sec.  She denies any symptoms of chest pain, shortness of breath, palpitations, or dizziness. She has had no TIA or CVA symptoms. She has been under a lot of stress and states that this has led to a stomach ache and back pain. She has gained 9 lbs.  Current Outpatient Prescriptions on File Prior to Visit  Medication Sig Dispense Refill  . allopurinol (ZYLOPRIM) 300 MG tablet Take 300 mg by mouth daily.     . Cholecalciferol (VITAMIN D) 2000 UNITS CAPS Take 1 capsule by mouth daily.     . clobetasol ointment (TEMOVATE) AB-123456789 % Apply 1 application topically as needed.     . fexofenadine (ALLEGRA) 180 MG tablet Take 180 mg by mouth daily.    Marland Kitchen levothyroxine (SYNTHROID, LEVOTHROID) 100 MCG tablet Take 100 mcg by mouth daily before breakfast.    . lisinopril-hydrochlorothiazide (PRINZIDE,ZESTORETIC) 20-12.5 MG per tablet Take 1 tablet by mouth 2 (two) times daily.      . methocarbamol (ROBAXIN) 500 MG tablet Take 500 mg by mouth 2 (two) times daily.      Marland Kitchen omeprazole (PRILOSEC) 20 MG capsule Take 20 mg by mouth daily.     . pravastatin (PRAVACHOL) 40 MG tablet Take 40 mg by mouth daily.      . traMADol (ULTRAM) 50 MG tablet Take 50 mg by mouth every 6 (six) hours as needed for moderate pain.     Marland Kitchen warfarin (COUMADIN) 5 MG tablet Take 2.5-5 mg by mouth every evening. Take 2.5mg  on Mon / Wed / Fri Take 5mg  on Sun / Tues / Thurs / Sat     No current facility-administered medications on file prior to visit.    No Known Allergies  Past Medical History  Diagnosis Date  . Hypertension   . Dyslipidemia   . Obesity   . GERD (gastroesophageal  reflux disease)   . AF (atrial fibrillation) ([place])     History reviewed. No pertinent past surgical history.  History  Smoking status  . Former Smoker  . Quit date: 09/12/1998  Smokeless tobacco  . Not on file    History  Alcohol Use No    Family History  Problem Relation Age of Onset  . Diabetes Mother   . Stroke Mother   . Heart disease Father   . Arrhythmia Sister   . Heart disease Sister     Review of Systems: As noted in history of present illness. All other systems were reviewed and are negative.  Physical Exam: BP 120/58 mmHg  Pulse 73  Ht 5\' 5"  (1.651 m)  Wt 97.841 kg (215 lb 11.2 oz)  BMI 35.89 kg/m2 She is an obese white female in no acute distress. HEENT exam is unremarkable. She has no JVD or bruits. Lungs are clear. Cardiac exam reveals an irregular rate and rhythm without gallop, murmur, or click. She has no edema. Pedal pulses are good.  LABORATORY DATA: Ecg today shows Afib with rate 73, low voltage. Nonspecific ST-T changes. I have personally reviewed and interpreted this study.   Assessment / Plan: 1. Atrial fibrillation. Rate is well controlled on no medication. She is on  anticoagulation with Coumadin.  She is asymptomatic. I will follow up in one year.  2. Morbid obesity. Encourage increased physical activity and weight loss.  3. Hyperlipidemia-on pravastatin. Labs followed by primary care.

## 2015-10-16 NOTE — Patient Instructions (Signed)
Continue your current therapy  I will see you in one year   

## 2015-10-21 DIAGNOSIS — Z7901 Long term (current) use of anticoagulants: Secondary | ICD-10-CM | POA: Diagnosis not present

## 2015-10-21 DIAGNOSIS — I482 Chronic atrial fibrillation: Secondary | ICD-10-CM | POA: Diagnosis not present

## 2015-11-24 DIAGNOSIS — Z7901 Long term (current) use of anticoagulants: Secondary | ICD-10-CM | POA: Diagnosis not present

## 2015-11-24 DIAGNOSIS — I8290 Acute embolism and thrombosis of unspecified vein: Secondary | ICD-10-CM | POA: Diagnosis not present

## 2015-11-26 DIAGNOSIS — L812 Freckles: Secondary | ICD-10-CM | POA: Diagnosis not present

## 2015-11-26 DIAGNOSIS — L905 Scar conditions and fibrosis of skin: Secondary | ICD-10-CM | POA: Diagnosis not present

## 2015-11-26 DIAGNOSIS — L821 Other seborrheic keratosis: Secondary | ICD-10-CM | POA: Diagnosis not present

## 2015-11-26 DIAGNOSIS — L900 Lichen sclerosus et atrophicus: Secondary | ICD-10-CM | POA: Diagnosis not present

## 2015-11-26 DIAGNOSIS — D225 Melanocytic nevi of trunk: Secondary | ICD-10-CM | POA: Diagnosis not present

## 2015-12-08 DIAGNOSIS — I482 Chronic atrial fibrillation: Secondary | ICD-10-CM | POA: Diagnosis not present

## 2015-12-08 DIAGNOSIS — Z7901 Long term (current) use of anticoagulants: Secondary | ICD-10-CM | POA: Diagnosis not present

## 2015-12-24 DIAGNOSIS — Z7901 Long term (current) use of anticoagulants: Secondary | ICD-10-CM | POA: Diagnosis not present

## 2015-12-24 DIAGNOSIS — I482 Chronic atrial fibrillation: Secondary | ICD-10-CM | POA: Diagnosis not present

## 2016-01-13 DIAGNOSIS — I482 Chronic atrial fibrillation: Secondary | ICD-10-CM | POA: Diagnosis not present

## 2016-01-13 DIAGNOSIS — Z7901 Long term (current) use of anticoagulants: Secondary | ICD-10-CM | POA: Diagnosis not present

## 2016-02-02 DIAGNOSIS — Z803 Family history of malignant neoplasm of breast: Secondary | ICD-10-CM | POA: Diagnosis not present

## 2016-02-02 DIAGNOSIS — Z1231 Encounter for screening mammogram for malignant neoplasm of breast: Secondary | ICD-10-CM | POA: Diagnosis not present

## 2016-02-18 DIAGNOSIS — I482 Chronic atrial fibrillation: Secondary | ICD-10-CM | POA: Diagnosis not present

## 2016-02-18 DIAGNOSIS — Z7901 Long term (current) use of anticoagulants: Secondary | ICD-10-CM | POA: Diagnosis not present

## 2016-03-03 ENCOUNTER — Encounter: Payer: Self-pay | Admitting: Internal Medicine

## 2016-03-03 ENCOUNTER — Ambulatory Visit (INDEPENDENT_AMBULATORY_CARE_PROVIDER_SITE_OTHER): Payer: Commercial Managed Care - HMO | Admitting: Internal Medicine

## 2016-03-03 VITALS — BP 120/60 | HR 68 | Ht 64.0 in | Wt 210.2 lb

## 2016-03-03 DIAGNOSIS — I482 Chronic atrial fibrillation: Secondary | ICD-10-CM | POA: Diagnosis not present

## 2016-03-03 DIAGNOSIS — Z1211 Encounter for screening for malignant neoplasm of colon: Secondary | ICD-10-CM

## 2016-03-03 DIAGNOSIS — K2100 Gastro-esophageal reflux disease with esophagitis, without bleeding: Secondary | ICD-10-CM

## 2016-03-03 DIAGNOSIS — R1012 Left upper quadrant pain: Secondary | ICD-10-CM | POA: Diagnosis not present

## 2016-03-03 DIAGNOSIS — M109 Gout, unspecified: Secondary | ICD-10-CM | POA: Diagnosis not present

## 2016-03-03 DIAGNOSIS — Z7901 Long term (current) use of anticoagulants: Secondary | ICD-10-CM

## 2016-03-03 DIAGNOSIS — E038 Other specified hypothyroidism: Secondary | ICD-10-CM | POA: Diagnosis not present

## 2016-03-03 DIAGNOSIS — R0789 Other chest pain: Secondary | ICD-10-CM | POA: Diagnosis not present

## 2016-03-03 DIAGNOSIS — I10 Essential (primary) hypertension: Secondary | ICD-10-CM | POA: Diagnosis not present

## 2016-03-03 DIAGNOSIS — N390 Urinary tract infection, site not specified: Secondary | ICD-10-CM | POA: Diagnosis not present

## 2016-03-03 DIAGNOSIS — R8299 Other abnormal findings in urine: Secondary | ICD-10-CM | POA: Diagnosis not present

## 2016-03-03 MED ORDER — NA SULFATE-K SULFATE-MG SULF 17.5-3.13-1.6 GM/177ML PO SOLN: 1.0000 | Freq: Once | ORAL | Status: DC

## 2016-03-03 NOTE — Progress Notes (Signed)
HISTORY OF PRESENT ILLNESS:  [name] [name] is a 70 y.o. female with multiple medical problems including chronic atrial fibrillation for which she is on Coumadin, who is referred by her primary care provider Dr. Osborne Casco with chief complaint of persistent left upper quadrant pain. The patient has not been seen in this office for many years. She has a history of GERD and underwent upper endoscopy February 2004. Mild esophagitis but otherwise normal. She also underwent colonoscopy at that time for Hemoccult-positive stool. Examination revealed mild sigmoid diverticulosis and a diminutive rectal polyp which was hyperplastic. She has not had follow-up. She also has a history of choledocholithiasis 3 years post microscopic cholecystectomy. She underwent ERCP with successful stone extraction. Her current history is that of left upper quadrant pain which began in January. The pain is very high up in the left upper quadrant and below the rib cage at the level of the lower chest. The discomfort is intermittent and can occur several times per month. It may last anywhere from 30 minutes to an hour. It has not awoken her from sleep. It is not affected by meals, activities, or bowel movements. There is no associated nausea, vomiting, or change in appetite. She denies melena or hematochezia. Weight has been stable. For her GERD she has been on daily PPI chronically. She denies reflux symptoms or dysphagia she has been self-medicating with ranitidine 1-3 times per day for the pain. The discomfort resolves after lying down and taking Zantac. An acids did not help. She underwent blood work earlier today with her PCP. Comprehensive metabolic panel was remarkable for alkaline phosphatase 145, BUN 62, creatinine 1.7. CBC was unremarkable.  REVIEW OF SYSTEMS:  All non-GI ROS negative except for sinus and allergy, arthritis, back pain, nosebleeds  Past Medical History  Diagnosis Date  . Hypertension   . Dyslipidemia   . Obesity    . GERD (gastroesophageal reflux disease)   . AF (atrial fibrillation) ([place])   . DJD (degenerative joint disease)   . Hypothyroidism   . Cholelithiasis   . Fatty liver disease, nonalcoholic   . Gout   . Arthritis   . Hepatitis A     Past Surgical History  Procedure Laterality Date  . Total abdominal hysterectomy      with appendectomy  . Cholecystectomy    . Toenail excision Bilateral   . Tonsillectomy    . Plantar fascia surgery Left   . Breast biopsy Bilateral     1 right, 2 left    Social History [name] [name]  reports that she quit smoking about 17 years ago. Her smoking use included Cigarettes. She has never used smokeless tobacco. She reports that she does not drink alcohol or use illicit drugs.  family history includes Arrhythmia in her sister; Breast cancer in her sister; Diabetes in her father, mother, and sister; Heart disease in her father and sister; Stroke in her mother.  No Known Allergies     PHYSICAL EXAMINATION: Vital signs: BP 120/60 mmHg  Pulse 68  Ht 5\' 4"  (1.626 m)  Wt 210 lb 4 oz (95.369 kg)  BMI 36.07 kg/m2  Constitutional: generally well-appearing, no acute distress Psychiatric: alert and oriented x3, cooperative but indifferent Eyes: extraocular movements intact, anicteric, conjunctiva pink Mouth: oral pharynx moist, no lesions Neck: supple without thyromegaly Lymph: no lymphadenopathy Cardiovascular: heart regular rate and rhythm, no murmur Lungs: clear to auscultation bilaterally Abdomen: soft, nontender, nondistended, no obvious ascites, no peritoneal signs, normal bowel sounds, no organomegaly  Rectal: Deferred Extremities: no clubbing cyanosis or lower extremity edema bilaterally Skin: no lesions on visible extremities Neuro: No focal deficits. No asterixis.   ASSESSMENT:  #1. 6 month history of left upper quadrant left lower chest pain of uncertain etiology. Possible causes include musculoskeletal, gastrointestinal luminal  abnormalities, solid organ abnormalities, recurrent choledocholithiasis with atypical location (alkaline phosphatase 145) #2. GERD. Classic symptoms controlled with PPI #3. Screening colonoscopy. Last examination 2004 with hyperplastic polyp #5. Chronic atrial fibrillation on Coumadin   PLAN:  #1. CT scan of the chest and abdomen to evaluate persistent left lower chest left upper quadrant pain. Radiology aware of her degree of renal insufficiency and will provide limited contrast, they say #2. Schedule upper endoscopy to further evaluate pain. The patient is high-risk due to the need to address any coagulation.The nature of the procedure, as well as the risks, benefits, and alternatives were carefully and thoroughly reviewed with the patient. Ample time for discussion and questions allowed. The patient understood, was satisfied, and agreed to proceed. #3. Schedule colonoscopy to further evaluate pain and provide overdue colon cancer screening. The patient is high risk due to the need to address any coagulation.The nature of the procedure, as well as the risks, benefits, and alternatives were carefully and thoroughly reviewed with the patient. Ample time for discussion and questions allowed. The patient understood, was satisfied, and agreed to proceed. #4. Hold Coumadin 3 days prior to the procedures. She has done this in the past. We will check with Dr. Osborne Casco, but believe this will be fine   a copy of this consultation note has been sent to Dr. Osborne Casco

## 2016-03-03 NOTE — Patient Instructions (Signed)
You have been scheduled for a CT scan of the abdomen and pelvis at [facility] ([address]---this is in the same building as [facility]).    You are scheduled on 03/09/2016 at 3:00pm. You should arrive 15 minutes prior to your appointment time for registration. Please follow the written instructions below on the day of your exam:  WARNING: IF YOU ARE ALLERGIC TO IODINE/X-RAY DYE, PLEASE NOTIFY RADIOLOGY IMMEDIATELY AT [phone]! YOU WILL BE GIVEN A 13 HOUR PREMEDICATION PREP.  1) Do not eat or drink anything after 11:00am (4 hours prior to your test) 2) You have been given 2 bottles of oral contrast to drink. The solution may taste               better if refrigerated, but do NOT add ice or any other liquid to this solution. Shake             well before drinking.    Drink 1 bottle of contrast @ 1:00pm (2 hours prior to your exam)  Drink 1 bottle of contrast @ 2:00pm (1 hour prior to your exam)  You may take any medications as prescribed with a small amount of water except for the following: Metformin, Glucophage, Glucovance, Avandamet, Riomet, Fortamet, Actoplus Met, Janumet, Glumetza or Metaglip. The above medications must be held the day of the exam AND 48 hours after the exam.  The purpose of you drinking the oral contrast is to aid in the visualization of your intestinal tract. The contrast solution may cause some diarrhea. Before your exam is started, you will be given a small amount of fluid to drink. Depending on your individual set of symptoms, you may also receive an intravenous injection of x-ray contrast/dye. Plan on being at [facility] for 30 minutes or long, depending on the type of exam you are having performed.  If you have any questions regarding your exam or if you need to reschedule, you may call the CT department at [phone] between the hours of 8:00 am and 5:00 pm,  Monday-Friday.  ________________________________________________________________________  [name] [name] have been scheduled for an endoscopy and colonoscopy. Please follow the written instructions given to you at your visit today. Please pick up your prep supplies at the pharmacy within the next 1-3 days. If you use inhalers (even only as needed), please bring them with you on the day of your procedure. Your physician has requested that you go to www.startemmi.com and enter the access code given to you at your visit today. This web site gives a general overview about your procedure. However, you should still follow specific instructions given to you by our office regarding your preparation for the procedure.

## 2016-03-08 ENCOUNTER — Telehealth: Payer: Self-pay

## 2016-03-09 ENCOUNTER — Ambulatory Visit (INDEPENDENT_AMBULATORY_CARE_PROVIDER_SITE_OTHER)
Admission: RE | Admit: 2016-03-09 | Discharge: 2016-03-09 | Disposition: A | Discharge status: Home / Self Care | Payer: Commercial Managed Care - HMO | Source: Ambulatory Visit | Attending: Internal Medicine | Admitting: Internal Medicine

## 2016-03-09 DIAGNOSIS — J439 Emphysema, unspecified: Secondary | ICD-10-CM | POA: Diagnosis not present

## 2016-03-09 DIAGNOSIS — R0789 Other chest pain: Secondary | ICD-10-CM

## 2016-03-09 DIAGNOSIS — R1012 Left upper quadrant pain: Secondary | ICD-10-CM

## 2016-03-09 DIAGNOSIS — K8050 Calculus of bile duct without cholangitis or cholecystitis without obstruction: Secondary | ICD-10-CM | POA: Diagnosis not present

## 2016-03-09 MED ORDER — IOPAMIDOL (ISOVUE-300) INJECTION 61%
80.0000 mL | Freq: Once | INTRAVENOUS | Status: AC | PRN
  Administered 2016-03-09: 80 mL via INTRAVENOUS

## 2016-03-10 ENCOUNTER — Telehealth: Payer: Self-pay

## 2016-03-10 ENCOUNTER — Other Ambulatory Visit: Payer: Self-pay

## 2016-03-10 DIAGNOSIS — Z0000 Encounter for general adult medical examination without abnormal findings: Secondary | ICD-10-CM | POA: Diagnosis not present

## 2016-03-10 DIAGNOSIS — I77810 Thoracic aortic ectasia: Secondary | ICD-10-CM | POA: Diagnosis not present

## 2016-03-10 DIAGNOSIS — Z7901 Long term (current) use of anticoagulants: Secondary | ICD-10-CM | POA: Diagnosis not present

## 2016-03-10 DIAGNOSIS — N184 Chronic kidney disease, stage 4 (severe): Secondary | ICD-10-CM | POA: Diagnosis not present

## 2016-03-10 DIAGNOSIS — D692 Other nonthrombocytopenic purpura: Secondary | ICD-10-CM | POA: Diagnosis not present

## 2016-03-10 DIAGNOSIS — I129 Hypertensive chronic kidney disease with stage 1 through stage 4 chronic kidney disease, or unspecified chronic kidney disease: Secondary | ICD-10-CM | POA: Diagnosis not present

## 2016-03-10 DIAGNOSIS — G4730 Sleep apnea, unspecified: Secondary | ICD-10-CM | POA: Diagnosis not present

## 2016-03-10 DIAGNOSIS — M109 Gout, unspecified: Secondary | ICD-10-CM | POA: Diagnosis not present

## 2016-03-10 DIAGNOSIS — E668 Other obesity: Secondary | ICD-10-CM | POA: Diagnosis not present

## 2016-03-10 DIAGNOSIS — K8050 Calculus of bile duct without cholangitis or cholecystitis without obstruction: Secondary | ICD-10-CM

## 2016-03-10 NOTE — Telephone Encounter (Signed)
No appt available for ERCP until 7/25 or 04/12/16. Pt would like to have the ERCP done on 8/1. ERCP scheduled with general anesthesia at [facility] 04/12/16@9 :45am. Pt to have IV Cipro pre-ercp. Pt to arrive at [facility] at 8:15am. Will contact cardiology regarding coumadin.

## 2016-03-10 NOTE — Telephone Encounter (Signed)
Pt is scheduled for an ERCP with Dr. Henrene Pastor 04/12/16. Please advise if pt is ok to hold her coumadin for 5 days prior to the procedure.

## 2016-03-11 DIAGNOSIS — Z1212 Encounter for screening for malignant neoplasm of rectum: Secondary | ICD-10-CM | POA: Diagnosis not present

## 2016-03-11 NOTE — Telephone Encounter (Signed)
Spoke with pt and she is aware and know to hold coumadin.

## 2016-03-11 NOTE — Telephone Encounter (Signed)
Given directions by Rosanne Sack

## 2016-03-11 NOTE — Telephone Encounter (Signed)
She may hold coumadin for 5 days prior to ERCP  Samiel Peel [place] MD, [facility]

## 2016-03-11 NOTE — Telephone Encounter (Signed)
Left message for pt to call back  °

## 2016-03-18 ENCOUNTER — Telehealth: Payer: Self-pay | Admitting: Internal Medicine

## 2016-03-18 NOTE — Telephone Encounter (Signed)
Left message for pt to call back. Pt is scheduled for ERCP on 04/12/16. She is overdue for screening colon and has h/o gerd.

## 2016-03-18 NOTE — Telephone Encounter (Signed)
Discussed with pt that he will most likely want her to have both procedures due to reflux and overdue colon. Pt will ask Dr. Henrene Pastor at time of ERCP to confirm.

## 2016-03-23 DIAGNOSIS — I482 Chronic atrial fibrillation: Secondary | ICD-10-CM | POA: Diagnosis not present

## 2016-03-23 DIAGNOSIS — Z7901 Long term (current) use of anticoagulants: Secondary | ICD-10-CM | POA: Diagnosis not present

## 2016-04-05 ENCOUNTER — Encounter (HOSPITAL_COMMUNITY): Payer: Self-pay | Admitting: *Deleted

## 2016-04-06 ENCOUNTER — Encounter (HOSPITAL_COMMUNITY): Payer: Self-pay | Admitting: *Deleted

## 2016-04-12 ENCOUNTER — Ambulatory Visit (HOSPITAL_COMMUNITY): Payer: Commercial Managed Care - HMO | Admitting: Anesthesiology

## 2016-04-12 ENCOUNTER — Encounter: Payer: Self-pay | Admitting: Internal Medicine

## 2016-04-12 ENCOUNTER — Ambulatory Visit (HOSPITAL_COMMUNITY)
Admission: RE | Admit: 2016-04-12 | Discharge: 2016-04-12 | Disposition: A | Discharge status: Home / Self Care | Payer: Commercial Managed Care - HMO | Source: Ambulatory Visit | Attending: Internal Medicine | Admitting: Internal Medicine

## 2016-04-12 ENCOUNTER — Ambulatory Visit (HOSPITAL_COMMUNITY): Payer: Commercial Managed Care - HMO

## 2016-04-12 ENCOUNTER — Encounter (HOSPITAL_COMMUNITY): Payer: Self-pay

## 2016-04-12 ENCOUNTER — Telehealth: Payer: Self-pay

## 2016-04-12 ENCOUNTER — Encounter (HOSPITAL_COMMUNITY)
Admission: RE | Disposition: A | Discharge status: Home / Self Care | Payer: Self-pay | Source: Ambulatory Visit | Attending: Internal Medicine

## 2016-04-12 DIAGNOSIS — K8050 Calculus of bile duct without cholangitis or cholecystitis without obstruction: Secondary | ICD-10-CM

## 2016-04-12 DIAGNOSIS — K838 Other specified diseases of biliary tract: Secondary | ICD-10-CM | POA: Diagnosis not present

## 2016-04-12 DIAGNOSIS — M109 Gout, unspecified: Secondary | ICD-10-CM | POA: Insufficient documentation

## 2016-04-12 DIAGNOSIS — K219 Gastro-esophageal reflux disease without esophagitis: Secondary | ICD-10-CM | POA: Diagnosis not present

## 2016-04-12 DIAGNOSIS — E039 Hypothyroidism, unspecified: Secondary | ICD-10-CM | POA: Insufficient documentation

## 2016-04-12 DIAGNOSIS — N289 Disorder of kidney and ureter, unspecified: Secondary | ICD-10-CM | POA: Insufficient documentation

## 2016-04-12 DIAGNOSIS — Z6836 Body mass index (BMI) 36.0-36.9, adult: Secondary | ICD-10-CM | POA: Diagnosis not present

## 2016-04-12 DIAGNOSIS — I10 Essential (primary) hypertension: Secondary | ICD-10-CM | POA: Diagnosis not present

## 2016-04-12 DIAGNOSIS — E669 Obesity, unspecified: Secondary | ICD-10-CM | POA: Insufficient documentation

## 2016-04-12 DIAGNOSIS — Z7901 Long term (current) use of anticoagulants: Secondary | ICD-10-CM | POA: Diagnosis not present

## 2016-04-12 DIAGNOSIS — K8051 Calculus of bile duct without cholangitis or cholecystitis with obstruction: Secondary | ICD-10-CM

## 2016-04-12 DIAGNOSIS — Z79899 Other long term (current) drug therapy: Secondary | ICD-10-CM | POA: Diagnosis not present

## 2016-04-12 DIAGNOSIS — Z87891 Personal history of nicotine dependence: Secondary | ICD-10-CM | POA: Insufficient documentation

## 2016-04-12 DIAGNOSIS — Z9049 Acquired absence of other specified parts of digestive tract: Secondary | ICD-10-CM | POA: Diagnosis not present

## 2016-04-12 DIAGNOSIS — E785 Hyperlipidemia, unspecified: Secondary | ICD-10-CM | POA: Insufficient documentation

## 2016-04-12 DIAGNOSIS — I482 Chronic atrial fibrillation: Secondary | ICD-10-CM | POA: Diagnosis not present

## 2016-04-12 HISTORY — PX: ERCP: SHX5425

## 2016-04-12 SURGERY — ERCP, WITH INTERVENTION IF INDICATED
Anesthesia: Monitor Anesthesia Care

## 2016-04-12 MED ORDER — EPHEDRINE SULFATE 50 MG/ML IJ SOLN
INTRAMUSCULAR | Status: DC | PRN
  Administered 2016-04-12: 5 mg via INTRAVENOUS

## 2016-04-12 MED ORDER — INDOMETHACIN 50 MG RE SUPP
RECTAL | Status: DC | PRN
  Administered 2016-04-12: 100 mg via RECTAL

## 2016-04-12 MED ORDER — SODIUM CHLORIDE 0.9 % IV SOLN: INTRAVENOUS | Status: DC

## 2016-04-12 MED ORDER — PHENYLEPHRINE HCL 10 MG/ML IJ SOLN
INTRAMUSCULAR | Status: DC | PRN
  Administered 2016-04-12 (×2): 80 ug via INTRAVENOUS

## 2016-04-12 MED ORDER — LACTATED RINGERS IV SOLN
INTRAVENOUS | Status: DC
  Administered 2016-04-12 (×2): via INTRAVENOUS

## 2016-04-12 MED ORDER — ROCURONIUM BROMIDE 100 MG/10ML IV SOLN
INTRAVENOUS | Status: DC | PRN
  Administered 2016-04-12: 40 mg via INTRAVENOUS

## 2016-04-12 MED ORDER — CIPROFLOXACIN IN D5W 400 MG/200ML IV SOLN
INTRAVENOUS | Status: AC
  Filled 2016-04-12: qty 200

## 2016-04-12 MED ORDER — FENTANYL CITRATE (PF) 100 MCG/2ML IJ SOLN
INTRAMUSCULAR | Status: AC
  Filled 2016-04-12: qty 2

## 2016-04-12 MED ORDER — INDOMETHACIN 50 MG RE SUPP
RECTAL | Status: AC
  Filled 2016-04-12: qty 2

## 2016-04-12 MED ORDER — LIDOCAINE HCL (CARDIAC) 20 MG/ML IV SOLN
INTRAVENOUS | Status: DC | PRN
  Administered 2016-04-12: 50 mg via INTRAVENOUS

## 2016-04-12 MED ORDER — GLUCAGON HCL RDNA (DIAGNOSTIC) 1 MG IJ SOLR
INTRAMUSCULAR | Status: AC
  Filled 2016-04-12: qty 1

## 2016-04-12 MED ORDER — CIPROFLOXACIN IN D5W 400 MG/200ML IV SOLN
400.0000 mg | Freq: Once | INTRAVENOUS | Status: AC
  Administered 2016-04-12: 400 mg via INTRAVENOUS

## 2016-04-12 MED ORDER — PROPOFOL 10 MG/ML IV BOLUS
INTRAVENOUS | Status: AC
  Filled 2016-04-12: qty 20

## 2016-04-12 MED ORDER — FENTANYL CITRATE (PF) 100 MCG/2ML IJ SOLN
INTRAMUSCULAR | Status: DC | PRN
  Administered 2016-04-12: 100 ug via INTRAVENOUS

## 2016-04-12 MED ORDER — SUCCINYLCHOLINE CHLORIDE 20 MG/ML IJ SOLN
INTRAMUSCULAR | Status: DC | PRN
  Administered 2016-04-12: 80 mg via INTRAVENOUS

## 2016-04-12 MED ORDER — SUGAMMADEX SODIUM 200 MG/2ML IV SOLN
INTRAVENOUS | Status: DC | PRN
  Administered 2016-04-12: 200 mg via INTRAVENOUS

## 2016-04-12 MED ORDER — SODIUM CHLORIDE 0.9 % IV SOLN
INTRAVENOUS | Status: DC | PRN
  Administered 2016-04-12: 120 mL

## 2016-04-12 MED ORDER — ONDANSETRON HCL 4 MG/2ML IJ SOLN
INTRAMUSCULAR | Status: DC | PRN
  Administered 2016-04-12: 4 mg via INTRAVENOUS

## 2016-04-12 MED ORDER — PROPOFOL 10 MG/ML IV BOLUS
INTRAVENOUS | Status: DC | PRN
  Administered 2016-04-12: 160 mg via INTRAVENOUS

## 2016-04-12 NOTE — Telephone Encounter (Signed)
-----   Message from Irene Shipper, MD sent at 04/12/2016  9:17 AM EDT ----- Regarding: Cancel procedures Please cancel the patient's colonoscopy and upper endoscopy on August 15 at 1:30 PM. She requests this. She states she will reschedule her routine colonoscopy later this year. Undergoing ERCP today. Please convert phone note for the record.

## 2016-04-12 NOTE — Transfer of Care (Signed)
Immediate Anesthesia Transfer of Care Note  Patient: [name] [name]  Procedure(s) Performed: Procedure(s): ENDOSCOPIC RETROGRADE CHOLANGIOPANCREATOGRAPHY (ERCP) (N/A)  Patient Location: PACU  Anesthesia Type:General  Level of Consciousness: awake, alert  and oriented  Airway & Oxygen Therapy: Patient Spontanous Breathing and Patient connected to face mask oxygen  Post-op Assessment: Report given to RN and Post -op Vital signs reviewed and stable  Post vital signs: Reviewed and stable  Last Vitals:  Vitals:   04/12/16 0904  BP: (!) 144/58  Pulse: 64  Resp: 13  Temp: 36.5 C    Last Pain:  Vitals:   04/12/16 0904  TempSrc: Oral         Complications: No apparent anesthesia complications

## 2016-04-12 NOTE — Anesthesia Preprocedure Evaluation (Signed)
Anesthesia Evaluation  Patient identified by MRN, date of birth, ID band Patient awake    Reviewed: Allergy & Precautions, NPO status , Patient's Chart, lab work & pertinent test results  Airway Mallampati: II  TM Distance: >3 FB Neck ROM: Full    Dental no notable dental hx. (+) Edentulous Upper, Edentulous Lower   Pulmonary neg pulmonary ROS, former smoker,    Pulmonary exam normal breath sounds clear to auscultation       Cardiovascular hypertension, Pt. on medications Normal cardiovascular exam+ dysrhythmias Atrial Fibrillation  Rhythm:Regular Rate:Normal     Neuro/Psych negative neurological ROS  negative psych ROS   GI/Hepatic negative GI ROS, Neg liver ROS,   Endo/Other  negative endocrine ROS  Renal/GU negative Renal ROS  negative genitourinary   Musculoskeletal negative musculoskeletal ROS (+)   Abdominal   Peds negative pediatric ROS (+)  Hematology negative hematology ROS (+)   Anesthesia Other Findings   Reproductive/Obstetrics negative OB ROS                             Anesthesia Physical Anesthesia Plan  ASA: III  Anesthesia Plan: MAC   Post-op Pain Management:    Induction:   Airway Management Planned:   Additional Equipment:   Intra-op Plan:   Post-operative Plan:   Informed Consent: I have reviewed the patients History and Physical, chart, labs and discussed the procedure including the risks, benefits and alternatives for the proposed anesthesia with the patient or authorized representative who has indicated his/her understanding and acceptance.   Dental advisory given  Plan Discussed with: CRNA  Anesthesia Plan Comments:         Anesthesia Quick Evaluation

## 2016-04-12 NOTE — Op Note (Signed)
[facility] Patient Name: [name] [name] Procedure Date: 04/12/2016 MRN: [id number] Attending MD: Docia Chuck. Henrene Pastor , MD Date of Birth: Feb 25, 1946 CSN: [id number] Age: 70 Admit Type: Outpatient Procedure:                ERCP, with biliary sphincterotomy and common bile                            duct stone extraction Indications:              Suspected bile duct stone(s), on CT and abdominal                            pain. Remote sphincterotomy with common duct stone                            extraction 20 years ago Providers:                Docia Chuck. Henrene Pastor, MD, Cleda Daub, RN, William Dalton, Technician, Herbie Drape, CRNA Referring MD:             [name] Medicines:                General Anesthesia, Cipro [id number] mg IV Complications:            No immediate complications. Estimated Blood Loss:     Estimated blood loss: none. Procedure:                Pre-Anesthesia Assessment:                           - Prior to the procedure, a History and Physical                            was performed, and patient medications and                            allergies were reviewed. The patient's tolerance of                            previous anesthesia was also reviewed. The risks                            and benefits of the procedure and the sedation                            options and risks were discussed with the patient.                            All questions were answered, and informed consent                            was obtained. Prior Anticoagulants: The patient has  taken Coumadin (warfarin), last dose was 6 days                            prior to procedure. ASA Grade Assessment: III - A                            patient with severe systemic disease. After                            reviewing the risks and benefits, the patient was                            deemed in satisfactory condition to  undergo the                            procedure.                           After obtaining informed consent, the scope was                            passed under direct vision. Throughout the                            procedure, the patient's blood pressure, pulse, and                            oxygen saturations were monitored continuously. The                            [id number]) scope was introduced through                            the mouth, and used to inject contrast into and                            used to inject contrast into the bile duct. The                            ERCP was accomplished without difficulty. The                            patient tolerated the procedure well. Scope In: Scope Out: Findings:      A scout film of the abdomen was obtained. Surgical clips, consistent       with a previous cholecystectomy, were seen in the area of the right       upper quadrant of the abdomen. The upper GI tract was traversed under       direct vision without detailed examination. The major papilla was       normal, with subtle evidence for prior sphincterotomy which was quite       small. The bile duct was deeply cannulated with the traction (standard)       sphincterotome. Contrast was injected. Opacification of the main bile  duct was seen, cystic duct, and intrahepatics were seen. The middle       third of the main bile duct contained one stone, which was 12 mm in       diameter. The common bile duct was diffusely dilated. Biliary       sphincterotomy was made with a traction (standard) sphincterotome using       ERBE electrocautery. There was no post-sphincterotomy bleeding. The       biliary tree was swept with an 15?"18 mm balloon starting at the upper       third of the main bile duct. One pigmented stone was removed. No stones       remained. Impression:               - Choledocholithiasis status post ERCP with                             sphincterotomy and common duct stone extraction. Moderate Sedation:      none Recommendation:           - Standard post ERCP observation                           - Indomethacin suppositories to be administered in                            addition to hydration                           - Home later today if doing well with GI office                            follow-up in 2 months                           - Resume Coumadin in 1 week given today                            sphincterotomy. Procedure Code(s):        --- Professional ---                           [phone], Endoscopic retrograde                            cholangiopancreatography (ERCP); with removal of                            calculi/debris from biliary/pancreatic duct(s)                           43262, Endoscopic retrograde                            cholangiopancreatography (ERCP); with                            sphincterotomy/papillotomy Diagnosis Code(s):        --- Professional ---  K80.50, Calculus of bile duct without cholangitis                            or cholecystitis without obstruction                           K83.8, Other specified diseases of biliary tract CPT copyright 2016 American Medical Association. All rights reserved. The codes documented in this report are preliminary and upon coder review may  be revised to meet current compliance requirements. Docia Chuck. Henrene Pastor, MD 04/12/2016 11:36:28 AM This report has been signed electronically. Number of Addenda: 0

## 2016-04-12 NOTE — Telephone Encounter (Signed)
Appts cancelled per request.

## 2016-04-12 NOTE — Anesthesia Postprocedure Evaluation (Signed)
Anesthesia Post Note  Patient: [name] [name]  Procedure(s) Performed: Procedure(s) (LRB): ENDOSCOPIC RETROGRADE CHOLANGIOPANCREATOGRAPHY (ERCP) (N/A)  Patient location during evaluation: Endoscopy Anesthesia Type: General Level of consciousness: awake and alert Pain management: pain level controlled Vital Signs Assessment: post-procedure vital signs reviewed and stable Respiratory status: spontaneous breathing, nonlabored ventilation, respiratory function stable and patient connected to nasal cannula oxygen Cardiovascular status: blood pressure returned to baseline and stable Postop Assessment: no signs of nausea or vomiting Anesthetic complications: no    Last Vitals:  Vitals:   04/12/16 1210 04/12/16 1220  BP: 119/60 (!) 123/58  Pulse: (!) 113 (!) 57  Resp: (!) 21 15  Temp:      Last Pain:  Vitals:   04/12/16 0904  TempSrc: Oral                 Montez Hageman

## 2016-04-12 NOTE — Discharge Instructions (Signed)
General Anesthesia, Adult, Care After Refer to this sheet in the next few weeks. These instructions provide you with information on caring for yourself after your procedure. Your health care provider may also give you more specific instructions. Your treatment has been planned according to current medical practices, but problems sometimes occur. Call your health care provider if you have any problems or questions after your procedure. WHAT TO EXPECT AFTER THE PROCEDURE After the procedure, it is typical to experience:  Sleepiness.  Nausea and vomiting. HOME CARE INSTRUCTIONS  For the first 24 hours after general anesthesia:  Have a responsible person with you.  Do not drive a car. If you are alone, do not take public transportation.  Do not drink alcohol.  Do not take medicine that has not been prescribed by your health care provider.  Do not sign important papers or make important decisions.  You may resume a normal diet and activities as directed by your health care provider.  Change bandages (dressings) as directed.  If you have questions or problems that seem related to general anesthesia, call the hospital and ask for the anesthetist or anesthesiologist on call. SEEK MEDICAL CARE IF:  You have nausea and vomiting that continue the day after anesthesia.  You develop a rash. SEEK IMMEDIATE MEDICAL CARE IF:   You have difficulty breathing.  You have chest pain.  You have any allergic problems.   This information is not intended to replace advice given to you by your health care provider. Make sure you discuss any questions you have with your health care provider.   Document Released: 12/05/2000 Document Revised: 09/19/2014 Document Reviewed: 12/28/2011 Elsevier Interactive Patient Education 2016 Else[employer] Endoscopic Retrograde Cholangiopancreatography (ERCP), Care After Refer to this sheet in the next few weeks. These instructions provide you with information on  caring for yourself after your procedure. Your health care provider may also give you more specific instructions. Your treatment has been planned according to current medical practices, but problems sometimes occur. Call your health care provider if you have any problems or questions after your procedure.  WHAT TO EXPECT AFTER THE PROCEDURE  After your procedure, it is typical to feel:   Soreness in your throat.   Sick to your stomach (nauseous).   Bloated.  Dizzy.   Fatigued. HOME CARE INSTRUCTIONS  Have a friend or family member stay with you for the first 24 hours after your procedure.  Start taking your usual medicines and eating normally as soon as you feel well enough to do so or as directed by your health care provider. SEEK MEDICAL CARE IF:  You have abdominal pain.   You develop signs of infection, such as:   Chills.   Feeling unwell.  SEEK IMMEDIATE MEDICAL CARE IF:  You have difficulty swallowing.  You have worsening throat, chest, or abdominal pain.  You vomit.  You have bloody or very black stools.  You have a fever.   This information is not intended to replace advice given to you by your health care provider. Make sure you discuss any questions you have with your health care provider.   Document Released: 06/19/2013 Document Reviewed: 06/19/2013 Elsevier Interactive Patient Education [employer].

## 2016-04-12 NOTE — H&P (Signed)
HISTORY OF PRESENT ILLNESS:  [name] [name] is a 70 y.o. female with multiple medical problems including chronic atrial fibrillation for which she is on Coumadin, who is referred by her primary care provider Dr. Osborne Casco with chief complaint of persistent left upper quadrant pain. The patient has not been seen in this office for many years. She has a history of GERD and underwent upper endoscopy February 2004. Mild esophagitis but otherwise normal. She also underwent colonoscopy at that time for Hemoccult-positive stool. Examination revealed mild sigmoid diverticulosis and a diminutive rectal polyp which was hyperplastic. She has not had follow-up. She also has a history of choledocholithiasis 3 years post microscopic cholecystectomy. She underwent ERCP with successful stone extraction. Her current history is that of left upper quadrant pain which began in January. The pain is very high up in the left upper quadrant and below the rib cage at the level of the lower chest. The discomfort is intermittent and can occur several times per month. It may last anywhere from 30 minutes to an hour. It has not awoken her from sleep. It is not affected by meals, activities, or bowel movements. There is no associated nausea, vomiting, or change in appetite. She denies melena or hematochezia. Weight has been stable. For her GERD she has been on daily PPI chronically. She denies reflux symptoms or dysphagia she has been self-medicating with ranitidine 1-3 times per day for the pain. The discomfort resolves after lying down and taking Zantac. An acids did not help. She underwent blood work earlier today with her PCP. Comprehensive metabolic panel was remarkable for alkaline phosphatase 145, BUN 62, creatinine 1.7. CBC was unremarkable.  REVIEW OF SYSTEMS:  All non-GI ROS negative except for sinus and allergy, arthritis, back pain, nosebleeds      Past Medical History  Diagnosis Date  . Hypertension   . Dyslipidemia    . Obesity   . GERD (gastroesophageal reflux disease)   . AF (atrial fibrillation) ([place])   . DJD (degenerative joint disease)   . Hypothyroidism   . Cholelithiasis   . Fatty liver disease, nonalcoholic   . Gout   . Arthritis   . Hepatitis A           Past Surgical History  Procedure Laterality Date  . Total abdominal hysterectomy      with appendectomy  . Cholecystectomy    . Toenail excision Bilateral   . Tonsillectomy    . Plantar fascia surgery Left   . Breast biopsy Bilateral     1 right, 2 left    Social History [name] [name]  reports that she quit smoking about 17 years ago. Her smoking use included Cigarettes. She has never used smokeless tobacco. She reports that she does not drink alcohol or use illicit drugs.  family history includes Arrhythmia in her sister; Breast cancer in her sister; Diabetes in her father, mother, and sister; Heart disease in her father and sister; Stroke in her mother.  No Known Allergies     PHYSICAL EXAMINATION: Vital signs: BP 120/60 mmHg  Pulse 68  Ht 5\' 4"  (1.626 m)  Wt 210 lb 4 oz (95.369 kg)  BMI 36.07 kg/m2  Constitutional: generally well-appearing, no acute distress Psychiatric: alert and oriented x3, cooperative but indifferent Eyes: extraocular movements intact, anicteric, conjunctiva pink Mouth: oral pharynx moist, no lesions Neck: supple without thyromegaly Lymph: no lymphadenopathy Cardiovascular: heart regular rate and rhythm, no murmur Lungs: clear to auscultation bilaterally Abdomen: soft, nontender, nondistended, no  obvious ascites, no peritoneal signs, normal bowel sounds, no organomegaly Rectal: Deferred Extremities: no clubbing cyanosis or lower extremity edema bilaterally Skin: no lesions on visible extremities Neuro: No focal deficits. No asterixis.   ASSESSMENT:  #1. 6 month history of left upper quadrant left lower chest pain of uncertain etiology. Possible causes  include musculoskeletal, gastrointestinal luminal abnormalities, solid organ abnormalities, recurrent choledocholithiasis with atypical location (alkaline phosphatase 145) #2. GERD. Classic symptoms controlled with PPI #3. Screening colonoscopy. Last examination 2004 with hyperplastic polyp #5. Chronic atrial fibrillation on Coumadin   PLAN:  #1. CT scan of the chest and abdomen to evaluate persistent left lower chest left upper quadrant pain. Radiology aware of her degree of renal insufficiency and will provide limited contrast, they say #2. Schedule upper endoscopy to further evaluate pain. The patient is high-risk due to the need to address any coagulation.The nature of the procedure, as well as the risks, benefits, and alternatives were carefully and thoroughly reviewed with the patient. Ample time for discussion and questions allowed. The patient understood, was satisfied, and agreed to proceed. #3. Schedule colonoscopy to further evaluate pain and provide overdue colon cancer screening. The patient is high risk due to the need to address any coagulation.The nature of the procedure, as well as the risks, benefits, and alternatives were carefully and thoroughly reviewed with the patient. Ample time for discussion and questions allowed. The patient understood, was satisfied, and agreed to proceed. #4. Hold Coumadin 3 days prior to the procedures. She has done this in the past. We will check with Dr. Osborne Casco, but believe this will be fine   GI ATTENDING Patient was seen in the office as outlined above. Post office evaluation revealed choledocholithiasis on CT scan. ERCP today with possible extension of sphincterotomy and stone extraction. Docia Chuck. Geri [place]., M.D. [facility] Division of Gastroenterology

## 2016-04-12 NOTE — Anesthesia Procedure Notes (Signed)
Procedure Name: Intubation Performed by: Shukri Nistler J Pre-anesthesia Checklist: Patient identified, Emergency Drugs available, Suction available, Patient being monitored and Timeout performed Patient Re-evaluated:Patient Re-evaluated prior to inductionOxygen Delivery Method: Circle system utilized Preoxygenation: Pre-oxygenation with 100% oxygen Intubation Type: IV induction Ventilation: Mask ventilation without difficulty Laryngoscope Size: Mac and 3 Grade View: Grade II Tube type: Oral Tube size: 7.0 mm Number of attempts: 1 Airway Equipment and Method: Stylet Placement Confirmation: ETT inserted through vocal cords under direct vision,  positive ETCO2,  CO2 detector and breath sounds checked- equal and bilateral Secured at: 21 cm Tube secured with: Tape Dental Injury: Teeth and Oropharynx as per pre-operative assessment        

## 2016-04-13 ENCOUNTER — Encounter (HOSPITAL_COMMUNITY): Payer: Self-pay | Admitting: Internal Medicine

## 2016-04-15 ENCOUNTER — Other Ambulatory Visit (INDEPENDENT_AMBULATORY_CARE_PROVIDER_SITE_OTHER): Payer: Commercial Managed Care - HMO

## 2016-04-15 ENCOUNTER — Other Ambulatory Visit: Payer: Self-pay

## 2016-04-15 ENCOUNTER — Telehealth: Payer: Self-pay | Admitting: Internal Medicine

## 2016-04-15 DIAGNOSIS — R1084 Generalized abdominal pain: Secondary | ICD-10-CM | POA: Diagnosis not present

## 2016-04-15 LAB — HEPATIC FUNCTION PANEL
ALBUMIN: 4 g/dL (ref 3.5–5.2)
ALK PHOS: 159 U/L — AB (ref 39–117)
ALT: 79 U/L — ABNORMAL HIGH (ref 0–35)
AST: 48 U/L — AB (ref 0–37)
Bilirubin, Direct: 0.1 mg/dL (ref 0.0–0.3)
TOTAL PROTEIN: 7 g/dL (ref 6.0–8.3)
Total Bilirubin: 0.6 mg/dL (ref 0.2–1.2)

## 2016-04-15 LAB — CBC WITH DIFFERENTIAL/PLATELET
BASOS ABS: 0 10*3/uL (ref 0.0–0.1)
Basophils Relative: 0.2 % (ref 0.0–3.0)
Eosinophils Absolute: 0 10*3/uL (ref 0.0–0.7)
Eosinophils Relative: 0.4 % (ref 0.0–5.0)
HEMATOCRIT: 34.9 % — AB (ref 36.0–46.0)
HEMOGLOBIN: 11.8 g/dL — AB (ref 12.0–15.0)
LYMPHS PCT: 16.1 % (ref 12.0–46.0)
Lymphs Abs: 1.3 10*3/uL (ref 0.7–4.0)
MCHC: 33.8 g/dL (ref 30.0–36.0)
MCV: 94 fl (ref 78.0–100.0)
MONOS PCT: 5.4 % (ref 3.0–12.0)
Monocytes Absolute: 0.4 10*3/uL (ref 0.1–1.0)
Neutro Abs: 6.2 10*3/uL (ref 1.4–7.7)
Neutrophils Relative %: 77.9 % — ABNORMAL HIGH (ref 43.0–77.0)
Platelets: 181 10*3/uL (ref 150.0–400.0)
RBC: 3.72 Mil/uL — AB (ref 3.87–5.11)
RDW: 14.5 % (ref 11.5–15.5)
WBC: 7.9 10*3/uL (ref 4.0–10.5)

## 2016-04-15 LAB — LIPASE: Lipase: 40 U/L (ref 11.0–59.0)

## 2016-04-15 NOTE — Telephone Encounter (Signed)
Spoke with pt and she is aware, orders in epic. 

## 2016-04-15 NOTE — Telephone Encounter (Signed)
Not sure why she would be having pain at this point. Have her come in for blood work. CBC, LFTs, and serum lipase. Thanks

## 2016-04-15 NOTE — Telephone Encounter (Signed)
Pt states she had ERCP done Tuesday. States Wed am she felt great but Wed afternoon she started having pain in the center of her abdomen above her belly button. Rates the pain at a 5 right now on a scale of 1-10. Pt states she started out feeling sore all over and then the pain began Wed afternoon. Reports that once and a while there will be a sharp pain. Please advise.

## 2016-04-21 DIAGNOSIS — H26493 Other secondary cataract, bilateral: Secondary | ICD-10-CM | POA: Diagnosis not present

## 2016-04-21 DIAGNOSIS — Z961 Presence of intraocular lens: Secondary | ICD-10-CM | POA: Diagnosis not present

## 2016-04-26 ENCOUNTER — Encounter: Payer: Commercial Managed Care - HMO | Admitting: Internal Medicine

## 2016-04-28 DIAGNOSIS — I482 Chronic atrial fibrillation: Secondary | ICD-10-CM | POA: Diagnosis not present

## 2016-04-28 DIAGNOSIS — Z7901 Long term (current) use of anticoagulants: Secondary | ICD-10-CM | POA: Diagnosis not present

## 2016-05-18 DIAGNOSIS — H26491 Other secondary cataract, right eye: Secondary | ICD-10-CM | POA: Diagnosis not present

## 2016-05-25 DIAGNOSIS — H26491 Other secondary cataract, right eye: Secondary | ICD-10-CM | POA: Diagnosis not present

## 2016-05-25 DIAGNOSIS — H26492 Other secondary cataract, left eye: Secondary | ICD-10-CM | POA: Diagnosis not present

## 2016-06-02 ENCOUNTER — Encounter: Payer: Self-pay | Admitting: Internal Medicine

## 2016-06-02 ENCOUNTER — Ambulatory Visit (INDEPENDENT_AMBULATORY_CARE_PROVIDER_SITE_OTHER): Payer: Commercial Managed Care - HMO | Admitting: Internal Medicine

## 2016-06-02 VITALS — BP 110/70 | HR 64 | Ht 64.0 in | Wt 213.4 lb

## 2016-06-02 DIAGNOSIS — K8050 Calculus of bile duct without cholangitis or cholecystitis without obstruction: Secondary | ICD-10-CM | POA: Diagnosis not present

## 2016-06-02 DIAGNOSIS — Z86010 Personal history of colonic polyps: Secondary | ICD-10-CM

## 2016-06-02 DIAGNOSIS — Z7901 Long term (current) use of anticoagulants: Secondary | ICD-10-CM | POA: Diagnosis not present

## 2016-06-02 DIAGNOSIS — R1012 Left upper quadrant pain: Secondary | ICD-10-CM | POA: Diagnosis not present

## 2016-06-02 NOTE — Progress Notes (Signed)
HISTORY OF PRESENT ILLNESS:  [name] [name] is a 70 y.o. female with multiple medical problems including chronic atrial fibrillation on Coumadin who was evaluated in the office 03/03/2016 regarding 6 month history of left upper quadrant and lower chest pain. See that dictation for details. A CT scan of the chest and abdomen was subsequently performed and she was found to have a common bile duct stone. Thus, she underwent ERCP with sphincterotomy extension and common duct stone extraction on 04/12/2016. She did well postprocedure contacted the office complaining of some vague abdominal discomfort for which repeat liver tests,  and lipase were obtained. These were unremarkable save mild liver test abnormalities. CBC was fine. Discomfort dissipated. No problems since. Presents today for follow-up. She did have an episode of nausea with vomiting and diarrhea yesterday. Now resolved. She reminds me that she is due for colonoscopy. Last examination 2004. No lower GI complaints.  REVIEW OF SYSTEMS:  All non-GI ROS negative upon review of  Past Medical History:  Diagnosis Date  . AF (atrial fibrillation) ([place])   . Arthritis   . Cholelithiasis   . Colon polyps   . Diverticulosis   . DJD (degenerative joint disease)   . Dyslipidemia   . Fatty liver disease, nonalcoholic   . GERD (gastroesophageal reflux disease)   . Gout   . Hepatitis A AGE 87  . Hypertension   . Hypothyroidism   . Obesity     Past Surgical History:  Procedure Laterality Date  . BREAST BIOPSY Bilateral    1 right, 2 left  . CHOLECYSTECTOMY  22 YRS AGO  . ERCP  20 YRS AGO  . ERCP N/A 04/12/2016   Procedure: ENDOSCOPIC RETROGRADE CHOLANGIOPANCREATOGRAPHY (ERCP);  Surgeon: Irene Shipper, MD;  Location: [facility];  Service: Endoscopy;  Laterality: N/A;  . PLANTAR FASCIA SURGERY Left   . TOENAIL EXCISION Bilateral   . TONSILLECTOMY    . TOTAL ABDOMINAL HYSTERECTOMY     with appendectomy, COMPLETE    Social History [name]  reports that she quit smoking about 17 years ago. Her smoking use included Cigarettes. She has never used smokeless tobacco. She reports that she does not drink alcohol or use drugs.  family history includes Arrhythmia in her sister; Breast cancer in her sister; Diabetes in her father, mother, and sister; Heart disease in her father and sister; Stroke in her mother.  Allergies  Allergen Reactions  . Adhesive [Tape] Other (See Comments)    Tears skin       PHYSICAL EXAMINATION: Vital signs: BP 110/70 (BP Location: Left Arm, Patient Position: Sitting, Cuff Size: Normal)   Pulse 64   Ht 5\' 4"  (1.626 m)   Wt 213 lb 6.4 oz (96.8 kg)   BMI 36.63 kg/m   Constitutional: generally well-appearing, no acute distress Psychiatric: alert and oriented x3, cooperative Eyes: extraocular movements intact, anicteric, conjunctiva pink Mouth: oral pharynx moist, no lesions Neck: supple no lymphadenopathy Cardiovascular: heart regular rate and rhythm, no murmur Lungs: clear to auscultation bilaterally Abdomen: soft, nontender, nondistended, no obvious ascites, no peritoneal signs, normal bowel sounds, no organomegaly Rectal: Deferred until colonoscopy Extremities: no clubbing cyanosis or lower extremity edema bilaterally Skin: no lesions on visible extremities Neuro: No focal deficits. Cranial nerves intact  ASSESSMENT:  #1. Choledocholithiasis status post ERC with sphincterotomy and common duct stone extraction #2. Left upper quadrant lower chest discomfort secondary to #1. Resolved #3. Transient problems with nausea, vomiting, and diarrhea. Likely viral gastroenteritis #4. Colonoscopy  2004 with hyperplastic polyp #5. Multiple significant medical problems on chronic Coumadin   PLAN:  #1. Discussion today on choledocholithiasis. #2. Reassurance regarding acute viral illness. Contact the office for recurrent problems #3. Schedule screening colonoscopy. The patient is high-risk given her  comorbidities and the need for anticoagulation.The nature of the procedure, as well as the risks, benefits, and alternatives were carefully and thoroughly reviewed with the patient. Ample time for discussion and questions allowed. The patient understood, was satisfied, and agreed to proceed. #4. Hold anticoagulation 3-5 days prior to the procedure. Okayed previously by cardiologist for recent procedure.

## 2016-06-02 NOTE — Patient Instructions (Signed)
You have been scheduled for a colonoscopy. Please follow written instructions given to you at your visit today.  Please pick up your prep supplies at the pharmacy within the next 1-3 days. If you use inhalers (even only as needed), please bring them with you on the day of your procedure.   

## 2016-06-08 DIAGNOSIS — Z7901 Long term (current) use of anticoagulants: Secondary | ICD-10-CM | POA: Diagnosis not present

## 2016-06-08 DIAGNOSIS — Z23 Encounter for immunization: Secondary | ICD-10-CM | POA: Diagnosis not present

## 2016-06-08 DIAGNOSIS — I482 Chronic atrial fibrillation: Secondary | ICD-10-CM | POA: Diagnosis not present

## 2016-06-23 DIAGNOSIS — Z7901 Long term (current) use of anticoagulants: Secondary | ICD-10-CM | POA: Diagnosis not present

## 2016-06-23 DIAGNOSIS — I482 Chronic atrial fibrillation: Secondary | ICD-10-CM | POA: Diagnosis not present

## 2016-06-30 ENCOUNTER — Encounter: Payer: Self-pay | Admitting: Internal Medicine

## 2016-07-14 ENCOUNTER — Ambulatory Visit (AMBULATORY_SURGERY_CENTER): Payer: Commercial Managed Care - HMO | Admitting: Internal Medicine

## 2016-07-14 ENCOUNTER — Encounter: Payer: Self-pay | Admitting: Internal Medicine

## 2016-07-14 VITALS — BP 124/61 | HR 72 | Temp 97.8°F | Resp 11 | Ht 64.0 in | Wt 213.0 lb

## 2016-07-14 DIAGNOSIS — K219 Gastro-esophageal reflux disease without esophagitis: Secondary | ICD-10-CM | POA: Diagnosis not present

## 2016-07-14 DIAGNOSIS — D122 Benign neoplasm of ascending colon: Secondary | ICD-10-CM

## 2016-07-14 DIAGNOSIS — I10 Essential (primary) hypertension: Secondary | ICD-10-CM | POA: Diagnosis not present

## 2016-07-14 DIAGNOSIS — Z86010 Personal history of colonic polyps: Secondary | ICD-10-CM

## 2016-07-14 DIAGNOSIS — D123 Benign neoplasm of transverse colon: Secondary | ICD-10-CM | POA: Diagnosis not present

## 2016-07-14 DIAGNOSIS — Z1212 Encounter for screening for malignant neoplasm of rectum: Secondary | ICD-10-CM | POA: Diagnosis not present

## 2016-07-14 DIAGNOSIS — D120 Benign neoplasm of cecum: Secondary | ICD-10-CM

## 2016-07-14 DIAGNOSIS — Z1211 Encounter for screening for malignant neoplasm of colon: Secondary | ICD-10-CM | POA: Diagnosis present

## 2016-07-14 DIAGNOSIS — K635 Polyp of colon: Secondary | ICD-10-CM | POA: Diagnosis not present

## 2016-07-14 DIAGNOSIS — E039 Hypothyroidism, unspecified: Secondary | ICD-10-CM | POA: Diagnosis not present

## 2016-07-14 DIAGNOSIS — I4891 Unspecified atrial fibrillation: Secondary | ICD-10-CM | POA: Diagnosis not present

## 2016-07-14 MED ORDER — SODIUM CHLORIDE 0.9 % IV SOLN: 500.0000 mL | INTRAVENOUS | Status: DC

## 2016-07-14 NOTE — Progress Notes (Signed)
Report to PACU, RN, vss, BBS= Clear.  

## 2016-07-14 NOTE — Progress Notes (Signed)
Called to room to assist during endoscopic procedure.  Patient ID and intended procedure confirmed with present staff. Received instructions for my participation in the procedure from the performing physician.  

## 2016-07-14 NOTE — Op Note (Signed)
[place] Patient Name: [name] [name] Procedure Date: 07/14/2016 2:13 PM MRN: [id number] Endoscopist: Docia Chuck. Henrene Pastor , MD Age: 70 Referring MD:  Date of Birth: 04-12-1946 Gender: Female Account #: [id number] Procedure:                Colonoscopy, with cold snare polypectomy X3 Indications:              Screening for colorectal malignant neoplasm.                            Negative index exam 2004 Medicines:                Monitored Anesthesia Care Procedure:                Pre-Anesthesia Assessment:                           - Prior to the procedure, a History and Physical                            was performed, and patient medications and                            allergies were reviewed. The patient's tolerance of                            previous anesthesia was also reviewed. The risks                            and benefits of the procedure and the sedation                            options and risks were discussed with the patient.                            All questions were answered, and informed consent                            was obtained. Prior Anticoagulants: The patient has                            taken Coumadin (warfarin), last dose was 5 days                            prior to procedure. ASA Grade Assessment: III - A                            patient with severe systemic disease. After                            reviewing the risks and benefits, the patient was                            deemed in satisfactory condition to undergo the  procedure.                           After obtaining informed consent, the colonoscope                            was passed under direct vision. Throughout the                            procedure, the patient's blood pressure, pulse, and                            oxygen saturations were monitored continuously. The                            Model CF-HQ190L [id number]) scope was  introduced                            through the anus and advanced to the the cecum,                            identified by appendiceal orifice and ileocecal                            valve. The ileocecal valve, appendiceal orifice,                            and rectum were photographed. The quality of the                            bowel preparation was excellent. The colonoscopy                            was performed without difficulty. The patient                            tolerated the procedure well. The bowel preparation                            used was SUPREP. Scope In: 2:19:07 PM Scope Out: 2:36:32 PM Scope Withdrawal Time: 0 hours 12 minutes 22 seconds  Total Procedure Duration: 0 hours 17 minutes 25 seconds  Findings:                 Three polyps were found in the transverse colon,                            ascending colon and cecum. The polyps were 5 to 7                            mm in size. These polyps were removed with a cold                            snare. Resection and retrieval were complete.  The exam was otherwise without abnormality on                            direct and retroflexion views. Complications:            No immediate complications. Estimated blood loss:                            None. Estimated Blood Loss:     Estimated blood loss: none. Impression:               - Three 5 to 7 mm polyps in the transverse colon,                            in the ascending colon and in the cecum, removed                            with a cold snare. Resected and retrieved.                           - The examination was otherwise normal on direct                            and retroflexion views. Recommendation:           - Repeat colonoscopy in 3 years for surveillance.                           - Resume Coumadin (warfarin) today at prior dose.                           - Patient has a contact number available for                             emergencies. The signs and symptoms of potential                            delayed complications were discussed with the                            patient. Return to normal activities tomorrow.                            Written discharge instructions were provided to the                            patient.                           - Resume previous diet.                           - Continue present medications.                           - Await pathology results. Docia Chuck. Henrene Pastor, MD 07/14/2016  2:42:05 PM This report has been signed electronically.

## 2016-07-14 NOTE — Patient Instructions (Signed)
Instructions/Recommendations:  Polyp handout given to patient.  Repeat colonoscopy in 3 years for surveillance.  Resume Coumadin (warfarin) today at prior dose.  YOU HAD AN ENDOSCOPIC PROCEDURE TODAY AT [facility]:   Refer to the procedure report that was given to you for any specific questions about what was found during the examination.  If the procedure report does not answer your questions, please call your gastroenterologist to clarify.  If you requested that your care partner not be given the details of your procedure findings, then the procedure report has been included in a sealed envelope for you to review at your convenience later.  YOU SHOULD EXPECT: Some feelings of bloating in the abdomen. Passage of more gas than usual.  Walking can help get rid of the air that was put into your GI tract during the procedure and reduce the bloating. If you had a lower endoscopy (such as a colonoscopy or flexible sigmoidoscopy) you may notice spotting of blood in your stool or on the toilet paper. If you underwent a bowel prep for your procedure, you may not have a normal bowel movement for a few days.  Please Note:  You might notice some irritation and congestion in your nose or some drainage.  This is from the oxygen used during your procedure.  There is no need for concern and it should clear up in a day or so.  SYMPTOMS TO REPORT IMMEDIATELY:   Following lower endoscopy (colonoscopy or flexible sigmoidoscopy):  Excessive amounts of blood in the stool  Significant tenderness or worsening of abdominal pains  Swelling of the abdomen that is new, acute  Fever of 100F or higher For urgent or emergent issues, a gastroenterologist can be reached at any hour by calling [phone].   DIET:  We do recommend a small meal at first, but then you may proceed to your regular diet.  Drink plenty of fluids but you should avoid alcoholic beverages for 24 hours.  ACTIVITY:  You should  plan to take it easy for the rest of today and you should NOT DRIVE or use heavy machinery until tomorrow (because of the sedation medicines used during the test).    FOLLOW UP: Our staff will call the number listed on your records the next business day following your procedure to check on you and address any questions or concerns that you may have regarding the information given to you following your procedure. If we do not reach you, we will leave a message.  However, if you are feeling well and you are not experiencing any problems, there is no need to return our call.  We will assume that you have returned to your regular daily activities without incident.  If any biopsies were taken you will be contacted by phone or by letter within the next 1-3 weeks.  Please call us at [phone] if you have not heard about the biopsies in 3 weeks.    SIGNATURES/CONFIDENTIALITY: You and/or your care partner have signed paperwork which will be entered into your electronic medical record.  These signatures attest to the fact that that the information above on your After Visit Summary has been reviewed and is understood.  Full responsibility of the confidentiality of this discharge information lies with you and/or your care-partner.

## 2016-07-15 ENCOUNTER — Telehealth: Payer: Self-pay | Admitting: *Deleted

## 2016-07-15 NOTE — Telephone Encounter (Signed)
Pt not home on f/u call ;however'her sister says pt is fine and not having any problems after her procedure.

## 2016-07-19 ENCOUNTER — Encounter: Payer: Self-pay | Admitting: Internal Medicine

## 2016-07-21 ENCOUNTER — Telehealth: Payer: Self-pay | Admitting: Internal Medicine

## 2016-07-21 DIAGNOSIS — I482 Chronic atrial fibrillation: Secondary | ICD-10-CM | POA: Diagnosis not present

## 2016-07-21 DIAGNOSIS — Z7901 Long term (current) use of anticoagulants: Secondary | ICD-10-CM | POA: Diagnosis not present

## 2016-07-21 NOTE — Telephone Encounter (Signed)
Spoke with pt and results letter reviewed with pt.

## 2016-07-21 NOTE — Telephone Encounter (Signed)
Returned call, pt not home.

## 2016-08-30 DIAGNOSIS — I482 Chronic atrial fibrillation: Secondary | ICD-10-CM | POA: Diagnosis not present

## 2016-08-30 DIAGNOSIS — Z7901 Long term (current) use of anticoagulants: Secondary | ICD-10-CM | POA: Diagnosis not present

## 2016-09-22 DIAGNOSIS — I120 Hypertensive chronic kidney disease with stage 5 chronic kidney disease or end stage renal disease: Secondary | ICD-10-CM | POA: Diagnosis not present

## 2016-09-22 DIAGNOSIS — N184 Chronic kidney disease, stage 4 (severe): Secondary | ICD-10-CM | POA: Diagnosis not present

## 2016-09-22 DIAGNOSIS — D692 Other nonthrombocytopenic purpura: Secondary | ICD-10-CM | POA: Diagnosis not present

## 2016-09-22 DIAGNOSIS — I482 Chronic atrial fibrillation: Secondary | ICD-10-CM | POA: Diagnosis not present

## 2016-09-22 DIAGNOSIS — Z7901 Long term (current) use of anticoagulants: Secondary | ICD-10-CM | POA: Diagnosis not present

## 2016-09-22 DIAGNOSIS — E668 Other obesity: Secondary | ICD-10-CM | POA: Diagnosis not present

## 2016-09-22 DIAGNOSIS — E038 Other specified hypothyroidism: Secondary | ICD-10-CM | POA: Diagnosis not present

## 2016-09-22 DIAGNOSIS — M1990 Unspecified osteoarthritis, unspecified site: Secondary | ICD-10-CM | POA: Diagnosis not present

## 2016-09-22 DIAGNOSIS — I77810 Thoracic aortic ectasia: Secondary | ICD-10-CM | POA: Diagnosis not present

## 2016-10-05 DIAGNOSIS — I482 Chronic atrial fibrillation: Secondary | ICD-10-CM | POA: Diagnosis not present

## 2016-10-05 DIAGNOSIS — Z7901 Long term (current) use of anticoagulants: Secondary | ICD-10-CM | POA: Diagnosis not present

## 2016-10-16 NOTE — Progress Notes (Signed)
[name] [name] Date of Birth: Oct 17, 1945   History of Present Illness: [name] [name] is seen for  followup of atrial fibrillation. She has a history of permanent atrial fibrillation treated with  anticoagulation with Coumadin. No need for rate control therapy since HR has been normal. Prior Holter monitor showed average HR 72. Range 38-139. Longest pause 2.5 sec.  She denies any symptoms of chest pain, shortness of breath, palpitations, or dizziness. Totally unaware of Afib. She has had no TIA or CVA symptoms.  In August 2017 she had ERCP for common duct stone. In November she had colonoscopy with findings of three polyps. She reports one episode of epistaxis but this has resolved since using a humidifier.   Current Outpatient Prescriptions on File Prior to Visit  Medication Sig Dispense Refill  . allopurinol (ZYLOPRIM) 300 MG tablet Take 300 mg by mouth daily.     Marland Kitchen co-enzyme Q-10 30 MG capsule Take 30 mg by mouth daily.    . fexofenadine (ALLEGRA) 180 MG tablet Take 180 mg by mouth every evening.     . furosemide (LASIX) 20 MG tablet Take 1 tablet by mouth daily.    Marland Kitchen levothyroxine (SYNTHROID, LEVOTHROID) 100 MCG tablet Take 100 mcg by mouth daily before breakfast.    . lisinopril-hydrochlorothiazide (PRINZIDE,ZESTORETIC) 20-12.5 MG per tablet Take 1 tablet by mouth daily.     . methocarbamol (ROBAXIN) 500 MG tablet Take 500 mg by mouth 2 (two) times daily.      Marland Kitchen omeprazole (PRILOSEC) 20 MG capsule Take 20 mg by mouth daily.     . pravastatin (PRAVACHOL) 40 MG tablet Take 40 mg by mouth daily.      . ranitidine (ZANTAC) 150 MG tablet Take 150 mg by mouth daily as needed for heartburn.    . traMADol (ULTRAM) 50 MG tablet Take 50 mg by mouth every 6 (six) hours as needed for moderate pain.     Marland Kitchen warfarin (COUMADIN) 5 MG tablet Take 2.5-5 mg by mouth every evening. Take 2.5mg  on Tues, Weds, Friday, Sunday Take 5mg  Monday and Thursday     Current Facility-Administered Medications on File Prior  to Visit  Medication Dose Route Frequency Provider Last Rate Last Dose  . 0.9 %  sodium chloride infusion  500 mL Intravenous Continuous Irene Shipper, MD        Allergies  Allergen Reactions  . Adhesive [Tape] Other (See Comments)    Tears skin    Past Medical History:  Diagnosis Date  . AF (atrial fibrillation) ([place])   . Arthritis   . Cholelithiasis   . Colon polyps   . Diverticulosis   . DJD (degenerative joint disease)   . Dyslipidemia   . Fatty liver disease, nonalcoholic   . GERD (gastroesophageal reflux disease)   . Gout   . 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 Hypertension   . Hypothyroidism   . Obesity     Past Surgical History:  Procedure Laterality Date  . BREAST BIOPSY Bilateral    1 right, 2 left  . CHOLECYSTECTOMY  22 YRS AGO  . ERCP  20 YRS AGO  . ERCP N/A 04/12/2016   Procedure: ENDOSCOPIC RETROGRADE CHOLANGIOPANCREATOGRAPHY (ERCP);  Surgeon: Irene Shipper, MD;  Location: [facility];  Service: Endoscopy;  Laterality: N/A;  . PLANTAR FASCIA SURGERY Left   . TOENAIL EXCISION Bilateral   . TONSILLECTOMY    . TOTAL ABDOMINAL HYSTERECTOMY     with appendectomy, COMPLETE    History  Smoking Status  . Former Smoker  . Types: Cigarettes  . Quit date: 09/12/1998  Smokeless Tobacco  . Never Used    History  Alcohol Use No    Family History  Problem Relation Age of Onset  . Diabetes Mother   . Stroke Mother   . Heart disease Father   . Diabetes Father   . Arrhythmia Sister   . Heart disease Sister     x 2  . Breast cancer Sister     x 2  . Diabetes Sister     x 2    Review of Systems: As noted in history of present illness. All other systems were reviewed and are negative.  Physical Exam: BP 121/75   Pulse 68   Ht 5\' 5"  (1.651 m)   Wt 207 lb 9.6 oz (94.2 kg)   BMI 34.55 kg/m  She is an obese white female in no acute distress. HEENT exam is unremarkable. She has no JVD or bruits. Lungs are clear. Cardiac exam reveals an irregular rate and rhythm  without gallop, murmur, or click. She has no edema. Pedal pulses are good.  LABORATORY DATA:  Lab Results  Component Value Date   WBC 7.9 04/15/2016   HGB 11.8 (L) 04/15/2016   HCT 34.9 (L) 04/15/2016   PLT 181.0 04/15/2016   ALT 79 (H) 04/15/2016   AST 48 (H) 04/15/2016   Labs dated 03/03/16: cholesterol 154, triglycerides 72, LDL 86, HDL 54.  Dated 09/23/16: BUN 59, creatinine 1.5. CMET, TSH, CBC normal.   Ecg today shows Afib with rate 68, low voltage. Nonspecific ST-T changes. I have personally reviewed and interpreted this study.   Assessment / Plan: 1. Atrial fibrillation. Rate is well controlled on no medication. She is on anticoagulation with Coumadin.  She is asymptomatic. I will follow up in one year.  2. Morbid obesity. Encouraged continued weight loss. Weight down 6 lbs.  3. Hyperlipidemia-on pravastatin. Controlled.

## 2016-10-18 ENCOUNTER — Encounter: Payer: Self-pay | Admitting: Cardiology

## 2016-10-18 ENCOUNTER — Ambulatory Visit (INDEPENDENT_AMBULATORY_CARE_PROVIDER_SITE_OTHER): Payer: Medicare HMO | Admitting: Cardiology

## 2016-10-18 VITALS — BP 121/75 | HR 68 | Ht 65.0 in | Wt 207.6 lb

## 2016-10-18 DIAGNOSIS — I482 Chronic atrial fibrillation, unspecified: Secondary | ICD-10-CM

## 2016-10-18 DIAGNOSIS — I10 Essential (primary) hypertension: Secondary | ICD-10-CM

## 2016-10-18 DIAGNOSIS — E7800 Pure hypercholesterolemia, unspecified: Secondary | ICD-10-CM | POA: Diagnosis not present

## 2016-10-18 NOTE — Patient Instructions (Signed)
Continue your current therapy  I will see you in one year   

## 2016-11-10 DIAGNOSIS — Z7901 Long term (current) use of anticoagulants: Secondary | ICD-10-CM | POA: Diagnosis not present

## 2016-11-10 DIAGNOSIS — I482 Chronic atrial fibrillation: Secondary | ICD-10-CM | POA: Diagnosis not present

## 2016-11-22 IMAGING — CT CT CHEST W/ CM
3 of 6 series · 15 of 36 positions shown, 17 images · IV contrast (iopamidol)
Comparison: Abdominal ultrasound from 01/08/2007.

CLINICAL DATA: Left upper quadrant abdominal and chest pain

EXAM:
CT CHEST AND ABDOMEN WITH CONTRAST
TECHNIQUE: Multidetector CT imaging of the chest and abdomen was performed
following the standard protocol during bolus administration of
intravenous contrast.
CONTRAST:  80mL LIVLH2-711 IOPAMIDOL (LIVLH2-711) INJECTION 61%

[Series 2: cap with · axial · 0.82mm/px · z∈[-390,-90]mm · 6 of 85 slices shown, 8 images]
[im 13/85  mediastinal]
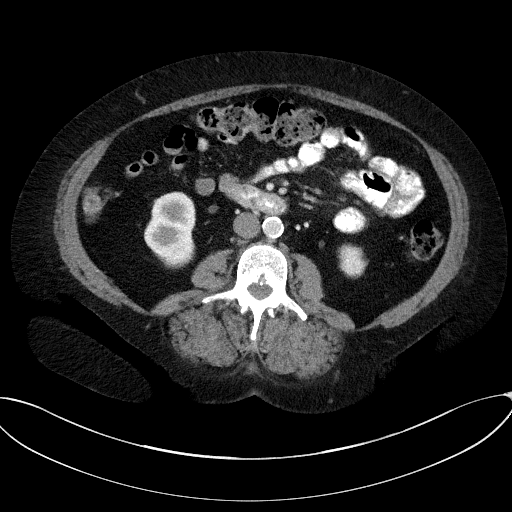
[im 13/85  lung]
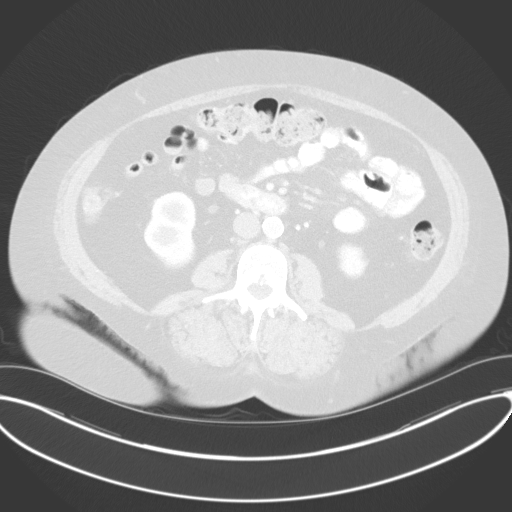
[im 25/85  lung]
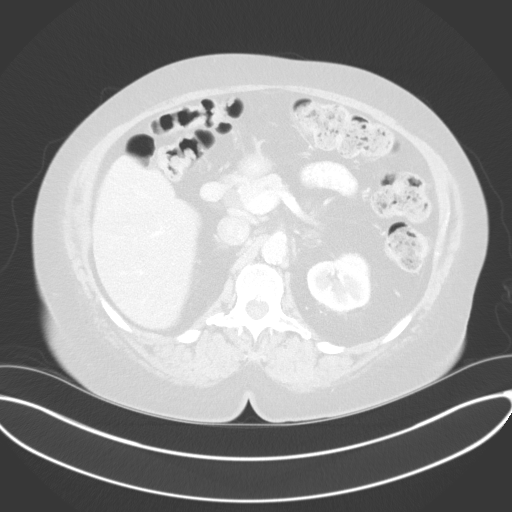
[im 37/85  lung]
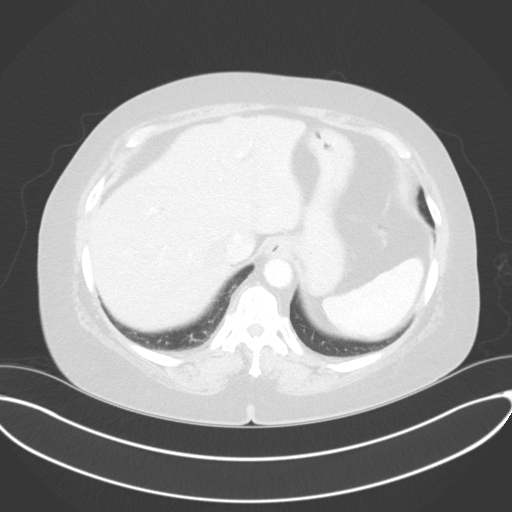
[im 49/85  lung]
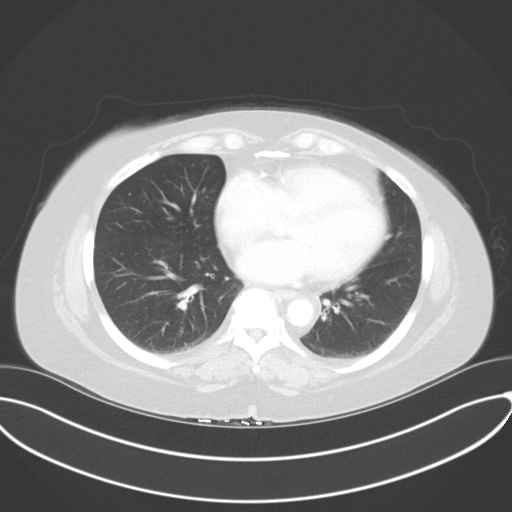
[im 61/85  mediastinal]
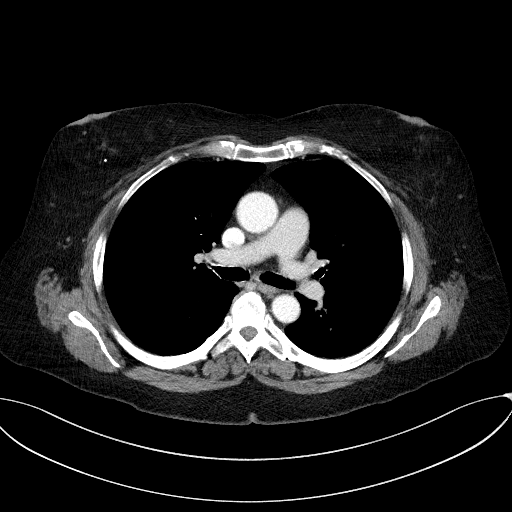
[im 61/85  lung]
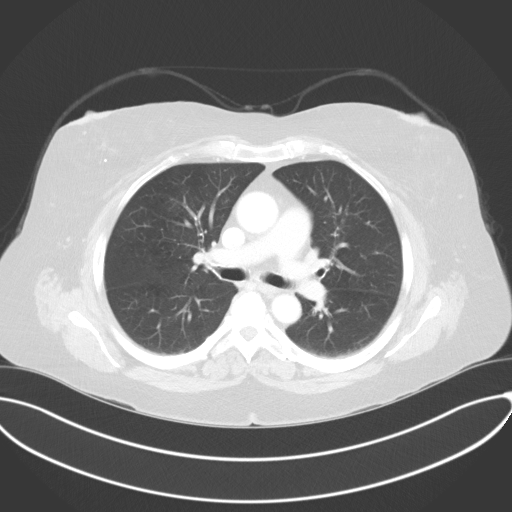
[im 73/85  lung]
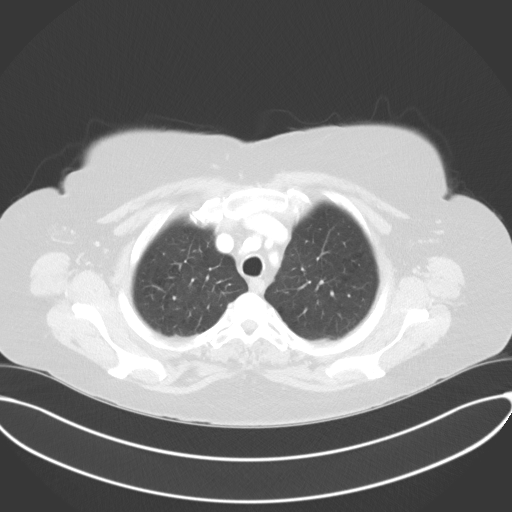

[Series 3: lung · axial · 0.82mm/px · z∈[-274,-128]mm · 6 of 135 slices shown]
[im 13/135  lung]
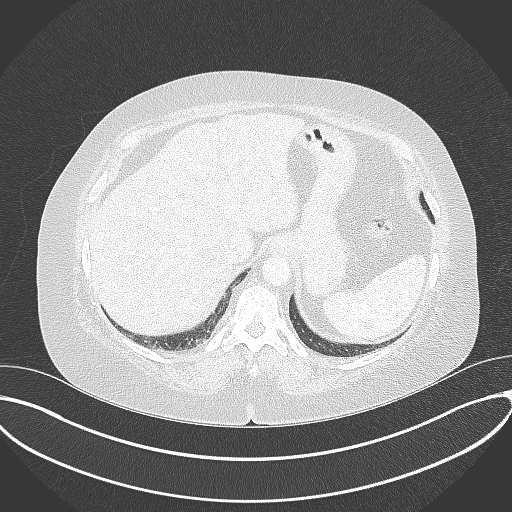
[im 25/135  lung]
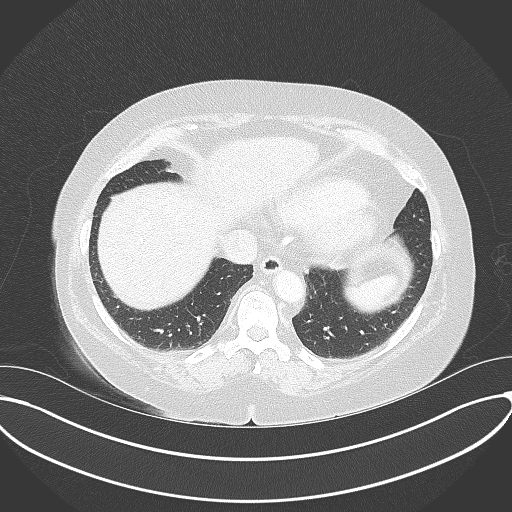
[im 49/135  lung]
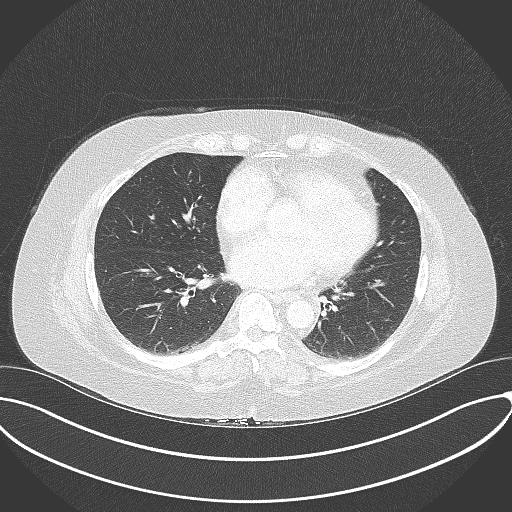
[im 61/135  lung]
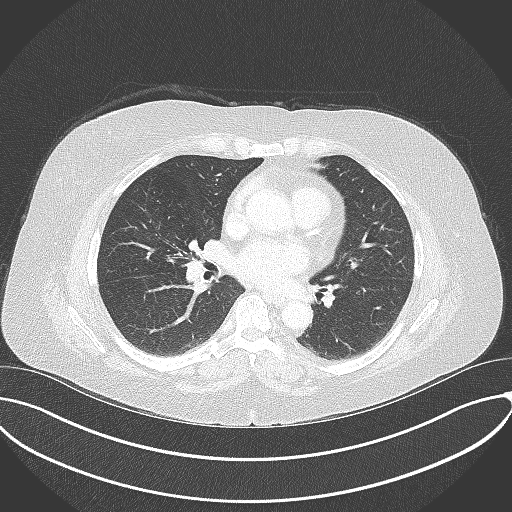
[im 74/135  lung]
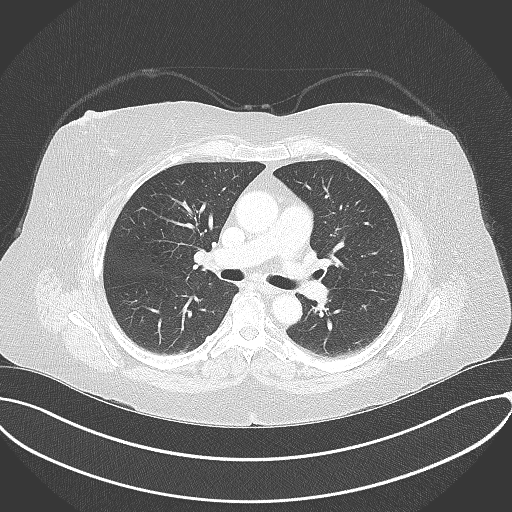
[im 86/135  lung]
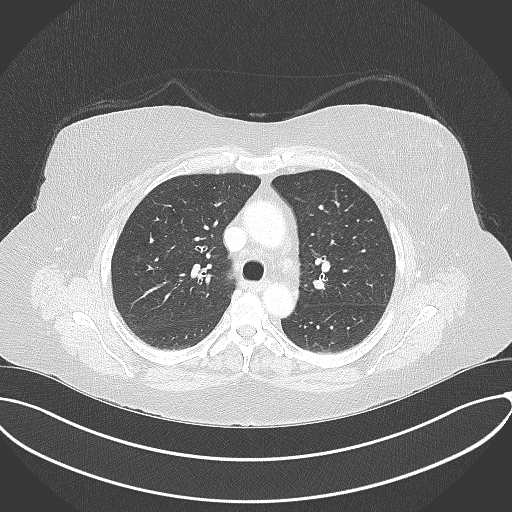

[Series 7: coronals · coronal · 0.63mm/px · 3 of 89 slices shown]
[im 18/89  lung]
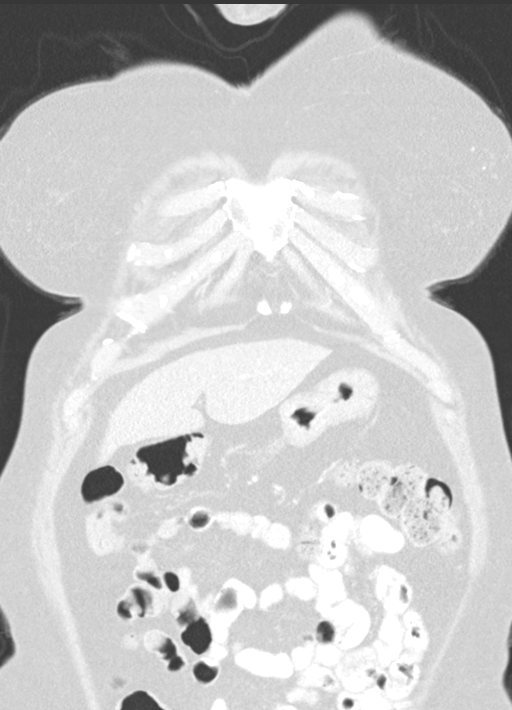
[im 36/89  lung]
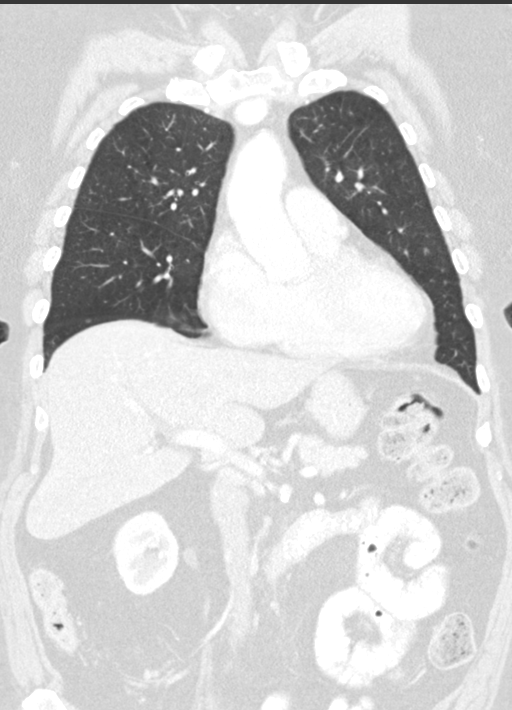
[im 53/89  lung]
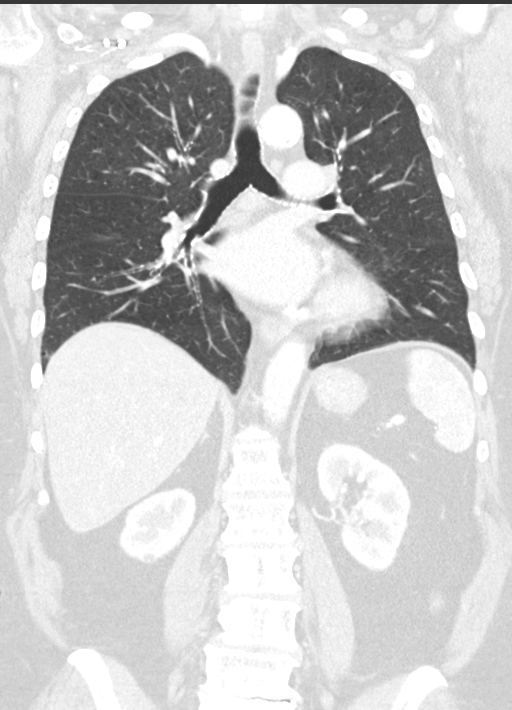

[15 of 36 positions shown; findings below may reference images not displayed]

FINDINGS: CT CHEST WITH CONTRAST

Mediastinum/Lymph Nodes: There is no axillary lymphadenopathy. 10 mm
short axis precarinal lymph node is upper normal. No mediastinal
lymphadenopathy. No hilar lymphadenopathy. The heart size is normal.
No pericardial effusion. Coronary artery calcification is noted. The
esophagus has normal imaging features.

Lungs/Pleura: Centrilobular emphysema noted bilaterally. No focal
airspace consolidation. No pulmonary edema or pleural effusion. No
suspicious pulmonary nodule or mass.

Musculoskeletal: Bone windows reveal no worrisome lytic or sclerotic
osseous lesions.

CT ABDOMEN WITH CONTRAST

Hepatobiliary: No focal abnormality within the liver parenchyma.
Trace prominence intrahepatic bile ducts noted. Extrahepatic common
duct is mildly distended and a 12 mm stone is identified in the
distal common bile duct (well seen on coronal image 33 of series 7
and also axial image 63 of series 2).

Pancreas: No focal mass lesion. No dilatation of the main duct. No
intraparenchymal cyst. No peripancreatic edema.

Spleen: No splenomegaly. No focal mass lesion.

Adrenals/Urinary Tract: No adrenal nodule or mass. Tiny subcapsular
cyst noted posterior interpolar right kidney no enhancing lesion in
either kidney. No hydronephrosis.

Stomach/Bowel: Stomach is nondistended. No gastric wall thickening.
No evidence of outlet obstruction. Duodenum is normally positioned
as is the ligament of Treitz. Visualize small bowel loops and
colonic segments of the abdomen are unremarkable.

Vascular/Lymphatic: There is abdominal aortic atherosclerosis
without aneurysm. There is no gastrohepatic or hepatoduodenal
ligament lymphadenopathy. No intraperitoneal or retroperitoneal
lymphadenopathy.

Other: No intraperitoneal free fluid.

Musculoskeletal: Bone windows reveal no worrisome lytic or sclerotic
osseous lesions.
IMPRESSION: 1. 12 mm distal common bile duct stone with mild intra and
extrahepatic biliary duct prominence.
2. No without other acute findings in the abdomen or pelvis.
Imaging findings of potential clinical significance:

Emphysema. (X5GX6-QPD.4)

Aortic Atherosclerosis (X5GX6-170.0)

## 2016-12-01 DIAGNOSIS — D235 Other benign neoplasm of skin of trunk: Secondary | ICD-10-CM | POA: Diagnosis not present

## 2016-12-01 DIAGNOSIS — L814 Other melanin hyperpigmentation: Secondary | ICD-10-CM | POA: Diagnosis not present

## 2016-12-01 DIAGNOSIS — L900 Lichen sclerosus et atrophicus: Secondary | ICD-10-CM | POA: Diagnosis not present

## 2016-12-14 DIAGNOSIS — Z7901 Long term (current) use of anticoagulants: Secondary | ICD-10-CM | POA: Diagnosis not present

## 2016-12-14 DIAGNOSIS — I482 Chronic atrial fibrillation: Secondary | ICD-10-CM | POA: Diagnosis not present

## 2016-12-26 IMAGING — RF DG ERCP WO/W SPHINCTEROTOMY
1 series · 15 of 21 positions shown · non-contrast
Comparison: CT from 03/09/2016

CLINICAL DATA: Distal common bile duct stone

EXAM:
ERCP
TECHNIQUE: Multiple spot images obtained with the fluoroscopic device and
submitted for interpretation post-procedure.
FLUOROSCOPY TIME:  Radiation Exposure Index (as provided by the
fluoroscopic device): 1008.09 mGy
If the device does not provide the exposure index:
Fluoroscopy Time:  10 minutes 46 seconds
Number of Acquired Images:  12

[Series 1: run · 12 acquisitions, 15 frames shown]
[im 1/12]
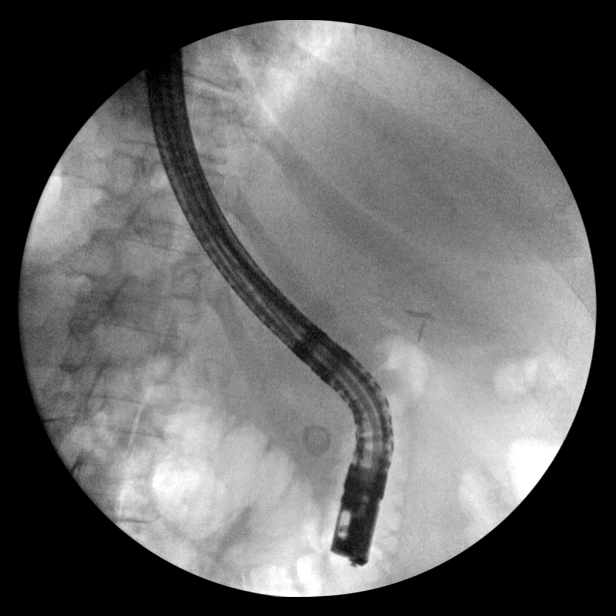
[im 3/12]
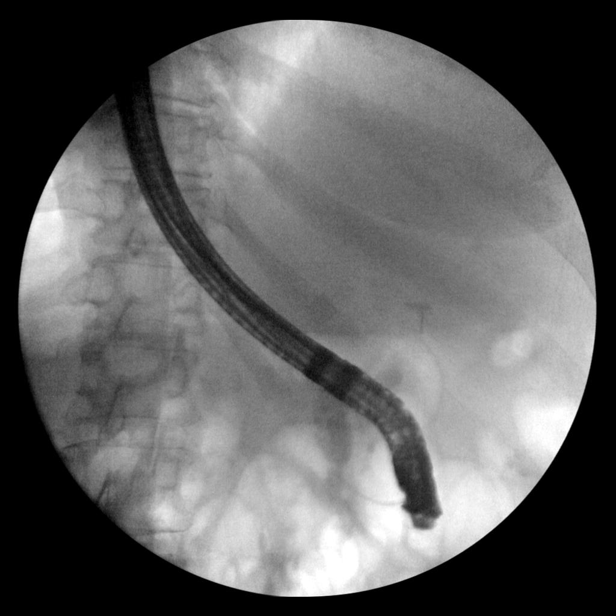
[im 3/12]
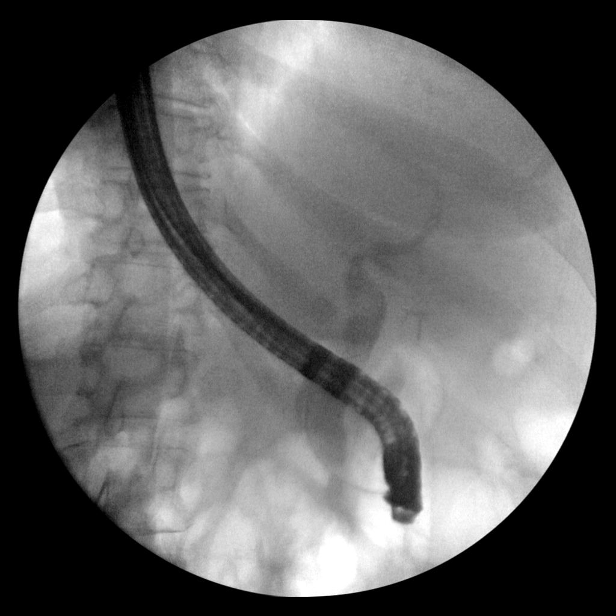
[im 3/12]
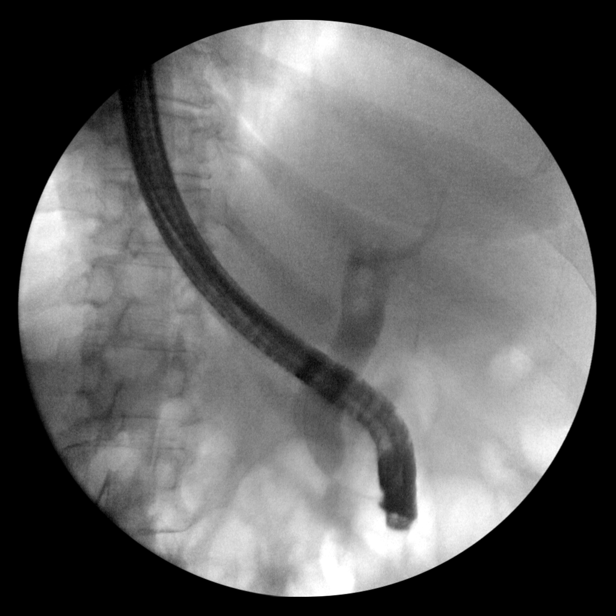
[im 4/12]
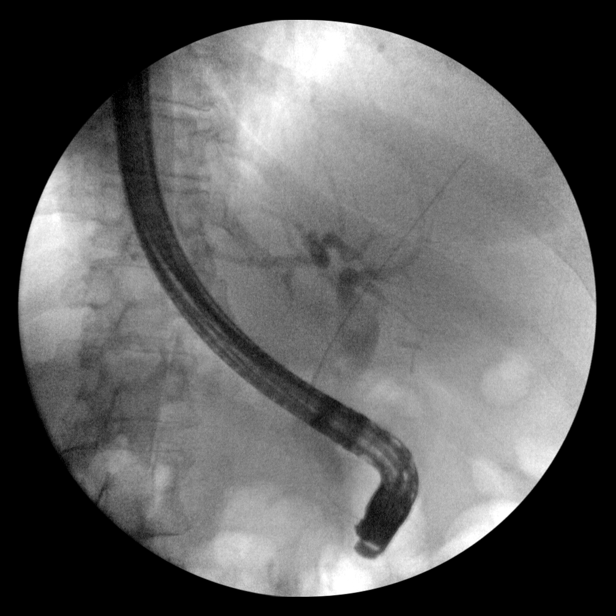
[im 5/12]
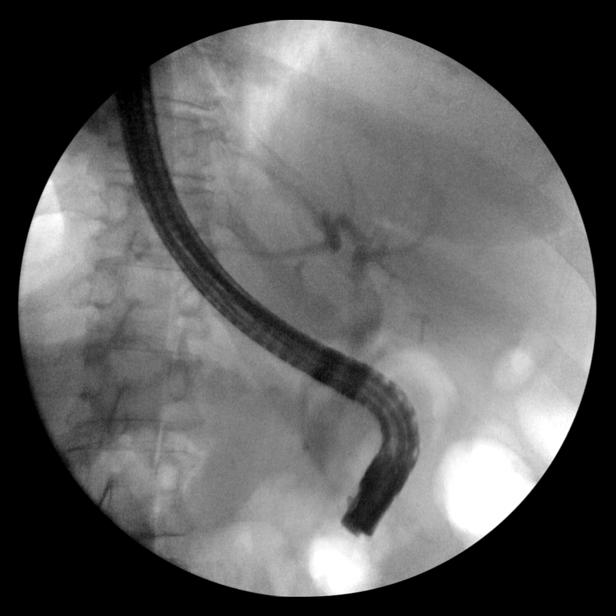
[im 5/12]
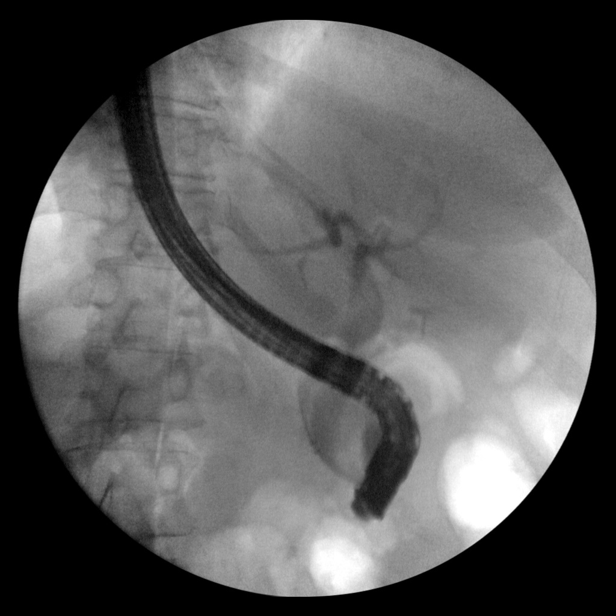
[im 5/12]
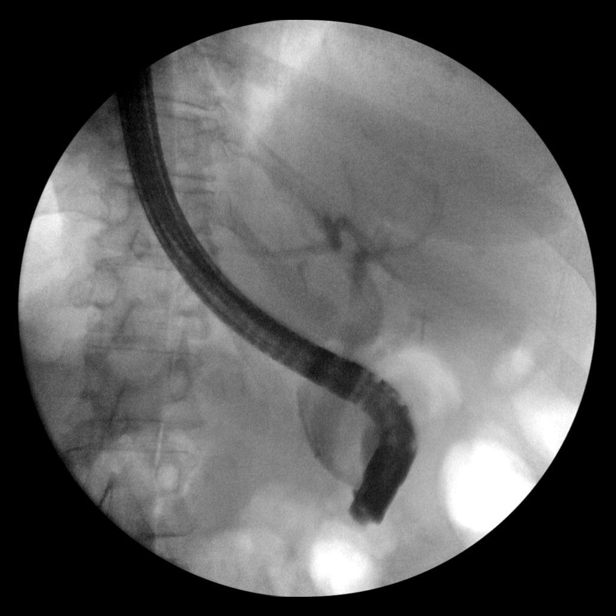
[im 6/12]
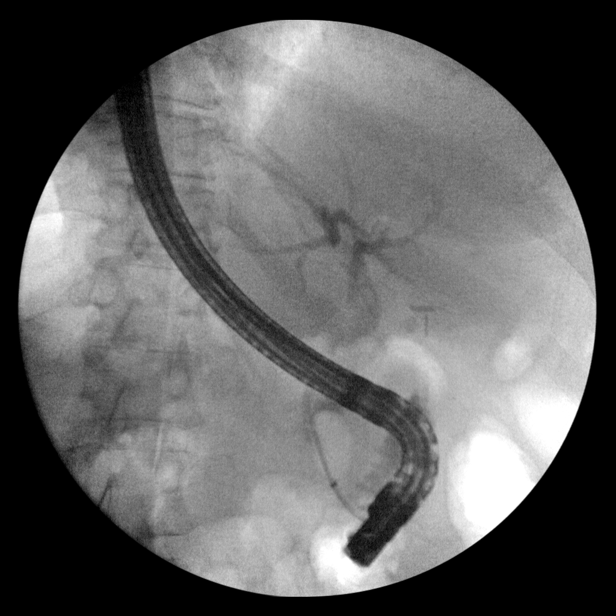
[im 7/12]
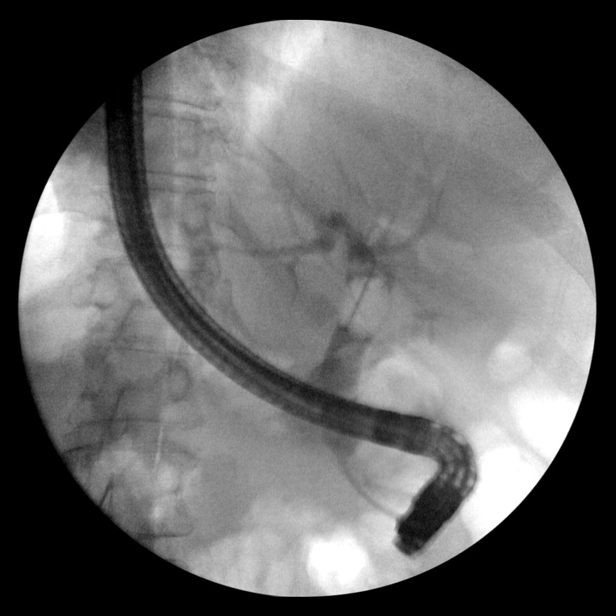
[im 7/12]
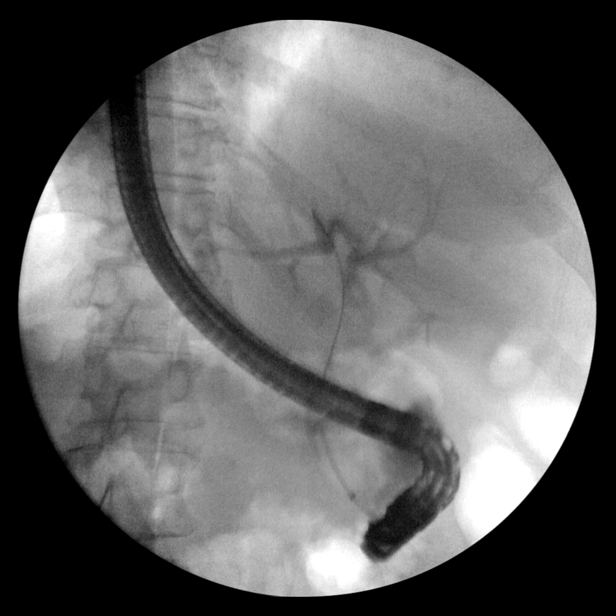
[im 8/12]
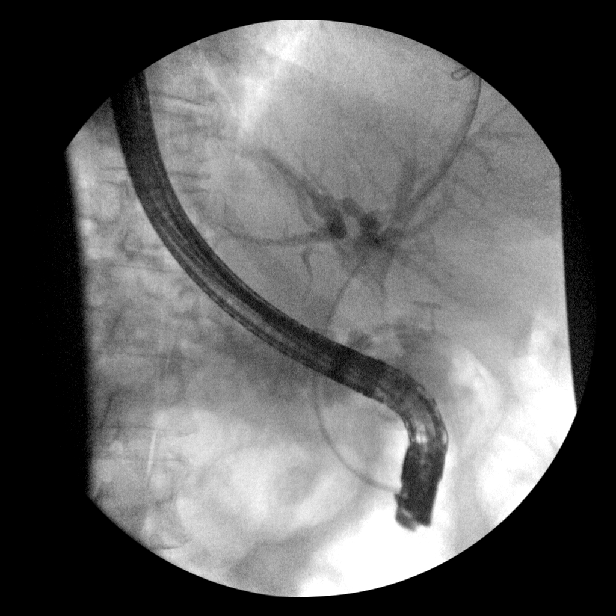
[im 9/12]
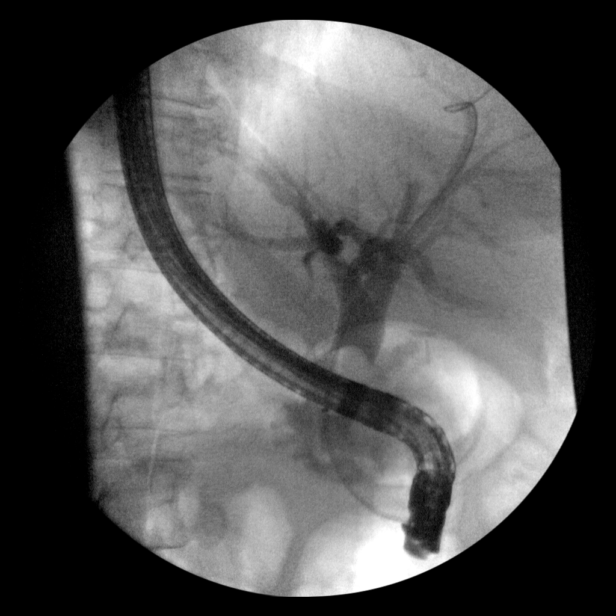
[im 10/12]
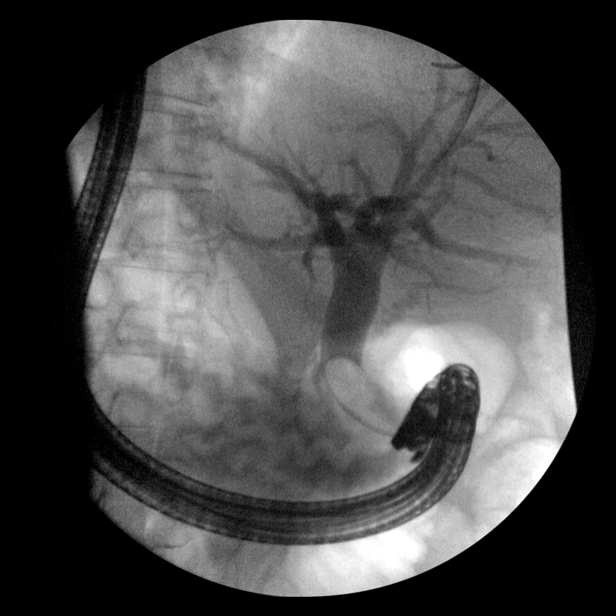
[im 12/12]
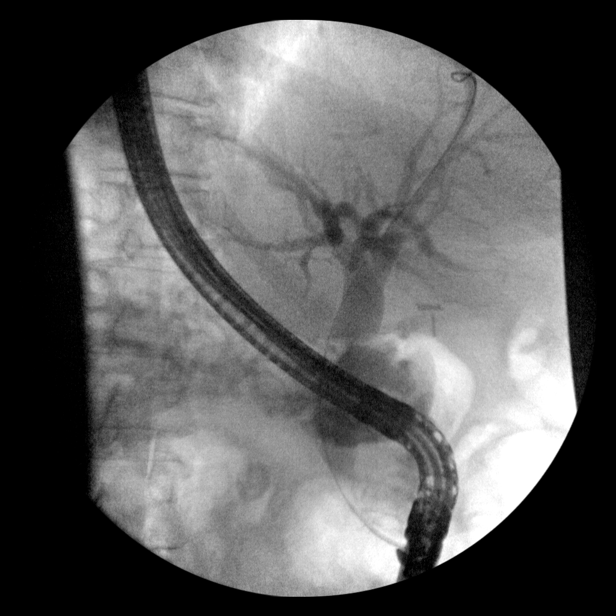

[15 of 21 positions shown; findings below may reference images not displayed]

FINDINGS: Initial images demonstrate endoscope with evidence of a large
calcification consistent with the known common bile duct stone.

Subsequent opacification of the biliary tree is noted or a
dilatation of the common bile duct. A guidewire was then advanced
into the CBD. Sphincterotomy was performed and balloon sweep of the
duct was performed. This produced 1 large common bile duct stone.
Followup imaging shows no residual calculi.
IMPRESSION: Sphincterotomy with stone removal.

These images were submitted for radiologic interpretation only.
Please see the procedural report for the amount of contrast and the
fluoroscopy time utilized.

## 2017-01-12 DIAGNOSIS — I482 Chronic atrial fibrillation: Secondary | ICD-10-CM | POA: Diagnosis not present

## 2017-01-12 DIAGNOSIS — Z7901 Long term (current) use of anticoagulants: Secondary | ICD-10-CM | POA: Diagnosis not present

## 2017-02-03 DIAGNOSIS — M25562 Pain in left knee: Secondary | ICD-10-CM | POA: Diagnosis not present

## 2017-02-03 DIAGNOSIS — M25561 Pain in right knee: Secondary | ICD-10-CM | POA: Diagnosis not present

## 2017-02-03 DIAGNOSIS — M170 Bilateral primary osteoarthritis of knee: Secondary | ICD-10-CM | POA: Diagnosis not present

## 2017-02-03 DIAGNOSIS — G8929 Other chronic pain: Secondary | ICD-10-CM | POA: Diagnosis not present

## 2017-02-03 DIAGNOSIS — M1712 Unilateral primary osteoarthritis, left knee: Secondary | ICD-10-CM | POA: Diagnosis not present

## 2017-02-07 DIAGNOSIS — Z1231 Encounter for screening mammogram for malignant neoplasm of breast: Secondary | ICD-10-CM | POA: Diagnosis not present

## 2017-02-07 DIAGNOSIS — Z803 Family history of malignant neoplasm of breast: Secondary | ICD-10-CM | POA: Diagnosis not present

## 2017-02-16 DIAGNOSIS — Z7901 Long term (current) use of anticoagulants: Secondary | ICD-10-CM | POA: Diagnosis not present

## 2017-02-16 DIAGNOSIS — I482 Chronic atrial fibrillation: Secondary | ICD-10-CM | POA: Diagnosis not present

## 2017-03-07 DIAGNOSIS — I10 Essential (primary) hypertension: Secondary | ICD-10-CM | POA: Diagnosis not present

## 2017-03-07 DIAGNOSIS — E038 Other specified hypothyroidism: Secondary | ICD-10-CM | POA: Diagnosis not present

## 2017-03-07 DIAGNOSIS — R8299 Other abnormal findings in urine: Secondary | ICD-10-CM | POA: Diagnosis not present

## 2017-03-07 DIAGNOSIS — M109 Gout, unspecified: Secondary | ICD-10-CM | POA: Diagnosis not present

## 2017-03-07 DIAGNOSIS — N390 Urinary tract infection, site not specified: Secondary | ICD-10-CM | POA: Diagnosis not present

## 2017-03-14 DIAGNOSIS — Z0000 Encounter for general adult medical examination without abnormal findings: Secondary | ICD-10-CM | POA: Diagnosis not present

## 2017-03-14 DIAGNOSIS — N184 Chronic kidney disease, stage 4 (severe): Secondary | ICD-10-CM | POA: Diagnosis not present

## 2017-03-14 DIAGNOSIS — I129 Hypertensive chronic kidney disease with stage 1 through stage 4 chronic kidney disease, or unspecified chronic kidney disease: Secondary | ICD-10-CM | POA: Diagnosis not present

## 2017-03-14 DIAGNOSIS — E038 Other specified hypothyroidism: Secondary | ICD-10-CM | POA: Diagnosis not present

## 2017-03-14 DIAGNOSIS — G4739 Other sleep apnea: Secondary | ICD-10-CM | POA: Diagnosis not present

## 2017-03-14 DIAGNOSIS — I77810 Thoracic aortic ectasia: Secondary | ICD-10-CM | POA: Diagnosis not present

## 2017-03-14 DIAGNOSIS — I10 Essential (primary) hypertension: Secondary | ICD-10-CM | POA: Diagnosis not present

## 2017-03-14 DIAGNOSIS — M25569 Pain in unspecified knee: Secondary | ICD-10-CM | POA: Diagnosis not present

## 2017-03-14 DIAGNOSIS — I482 Chronic atrial fibrillation: Secondary | ICD-10-CM | POA: Diagnosis not present

## 2017-03-20 DIAGNOSIS — Z1212 Encounter for screening for malignant neoplasm of rectum: Secondary | ICD-10-CM | POA: Diagnosis not present

## 2017-03-29 DIAGNOSIS — M1711 Unilateral primary osteoarthritis, right knee: Secondary | ICD-10-CM | POA: Diagnosis not present

## 2017-03-29 DIAGNOSIS — G8929 Other chronic pain: Secondary | ICD-10-CM | POA: Diagnosis not present

## 2017-03-29 DIAGNOSIS — M25561 Pain in right knee: Secondary | ICD-10-CM | POA: Diagnosis not present

## 2017-03-29 DIAGNOSIS — M1712 Unilateral primary osteoarthritis, left knee: Secondary | ICD-10-CM | POA: Diagnosis not present

## 2017-04-20 DIAGNOSIS — Z7901 Long term (current) use of anticoagulants: Secondary | ICD-10-CM | POA: Diagnosis not present

## 2017-04-20 DIAGNOSIS — I482 Chronic atrial fibrillation: Secondary | ICD-10-CM | POA: Diagnosis not present

## 2017-05-03 DIAGNOSIS — Z7901 Long term (current) use of anticoagulants: Secondary | ICD-10-CM | POA: Diagnosis not present

## 2017-05-03 DIAGNOSIS — I482 Chronic atrial fibrillation: Secondary | ICD-10-CM | POA: Diagnosis not present

## 2017-05-25 DIAGNOSIS — H524 Presbyopia: Secondary | ICD-10-CM | POA: Diagnosis not present

## 2017-05-31 DIAGNOSIS — Z7901 Long term (current) use of anticoagulants: Secondary | ICD-10-CM | POA: Diagnosis not present

## 2017-05-31 DIAGNOSIS — I482 Chronic atrial fibrillation: Secondary | ICD-10-CM | POA: Diagnosis not present

## 2017-05-31 DIAGNOSIS — Z23 Encounter for immunization: Secondary | ICD-10-CM | POA: Diagnosis not present

## 2017-07-12 DIAGNOSIS — I482 Chronic atrial fibrillation: Secondary | ICD-10-CM | POA: Diagnosis not present

## 2017-07-12 DIAGNOSIS — Z7901 Long term (current) use of anticoagulants: Secondary | ICD-10-CM | POA: Diagnosis not present

## 2017-08-02 DIAGNOSIS — I482 Chronic atrial fibrillation: Secondary | ICD-10-CM | POA: Diagnosis not present

## 2017-08-02 DIAGNOSIS — G8929 Other chronic pain: Secondary | ICD-10-CM | POA: Diagnosis not present

## 2017-08-02 DIAGNOSIS — M1711 Unilateral primary osteoarthritis, right knee: Secondary | ICD-10-CM | POA: Diagnosis not present

## 2017-08-02 DIAGNOSIS — M25561 Pain in right knee: Secondary | ICD-10-CM | POA: Diagnosis not present

## 2017-08-02 DIAGNOSIS — Z7901 Long term (current) use of anticoagulants: Secondary | ICD-10-CM | POA: Diagnosis not present

## 2017-08-02 DIAGNOSIS — M1712 Unilateral primary osteoarthritis, left knee: Secondary | ICD-10-CM | POA: Diagnosis not present

## 2017-08-25 ENCOUNTER — Telehealth: Payer: Self-pay | Admitting: *Deleted

## 2017-08-25 NOTE — Telephone Encounter (Signed)
   [facility] Pre-operative Risk Assessment    Request for surgical clearance:  1. What type of surgery is being performed? Left knee-TKA-medial and lateral w/wo patella resurfacing   2. When is this surgery scheduled? 09/25/17   3. Are there any medications that need to be held prior to surgery and how long?warfarin    4. Practice name and name of physician performing surgery? [facility]-Dr. Alvan Dame   5. What is your office phone and fax number? [phone]   6. Anesthesia type (None, local, MAC, general) ?    Russie Gulledge A Yanixan Mellinger 08/25/2017, 1:43 PM  _________________________________________________________________   (provider comments below)

## 2017-08-25 NOTE — Telephone Encounter (Signed)
   Primary Cardiologist:No primary care provider on file.  Chart reviewed as part of pre-operative protocol coverage. Because of [name] [name]'s past medical history and time since last visit, he/she will require a follow-up visit in order to better assess preoperative cardiovascular risk.  Visit by end of Dec first week in Jan with Dr. Martinique or app.  Pre-op covering staff: - Please schedule appointment and call patient to inform them. - Please contact requesting surgeon's office via preferred method (i.e, phone, fax) to inform them of need for appointment prior to surgery.  [name], NP  08/25/2017, 2:51 PM

## 2017-08-31 DIAGNOSIS — I482 Chronic atrial fibrillation: Secondary | ICD-10-CM | POA: Diagnosis not present

## 2017-08-31 DIAGNOSIS — Z7901 Long term (current) use of anticoagulants: Secondary | ICD-10-CM | POA: Diagnosis not present

## 2017-09-08 NOTE — H&P (Signed)
TOTAL KNEE ADMISSION H&P  Patient is being admitted for left total knee arthroplasty.  Subjective:  Chief Complaint:   Left knee primary OA / pain  HPI: [name] [name], 71 y.o. female, has a history of pain and functional disability in the left knee due to arthritis and has failed non-surgical conservative treatments for greater than 12 weeks to includeNSAID's and/or analgesics, corticosteriod injections and activity modification.  Onset of symptoms was gradual, starting 2+ years ago with gradually worsening course since that time. The patient noted prior procedures on the knee to include  arthroscopy on the left knee(s).  Patient currently rates pain in the left knee(s) at 10 out of 10 with activity. Patient has night pain, worsening of pain with activity and weight bearing, pain that interferes with activities of daily living, pain with passive range of motion, crepitus and joint swelling.  Patient has evidence of periarticular osteophytes and joint space narrowing by imaging studies.  There is no active infection.  Risks, benefits and expectations were discussed with the patient.  Risks including but not limited to the risk of anesthesia, blood clots, nerve damage, blood vessel damage, failure of the prosthesis, infection and up to and including death.  Patient understand the risks, benefits and expectations and wishes to proceed with surgery.   PCP: [name], MD  D/C Plans:       Home   Post-op Meds:       No Rx given   Tranexamic Acid:      To be given - IV   Decadron:      Is to be given  FYI:     Coumadin with branching Lovenox  Norco  DME:   Pt already has equipment   PT:   OPPT Rx given   Patient Active Problem List   Diagnosis Date Noted  . Bile duct stone   . Hypertension   . Dyslipidemia   . Obesity   . GERD (gastroesophageal reflux disease)   . AF (atrial fibrillation) ([place])   . Hypercholesterolemia    Past Medical History:  Diagnosis Date  . AF (atrial  fibrillation) ([place])   . Arthritis   . Cholelithiasis   . Colon polyps   . Diverticulosis   . DJD (degenerative joint disease)   . Dyslipidemia   . Fatty liver disease, nonalcoholic   . GERD (gastroesophageal reflux disease)   . Gout   . 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 Hypertension   . Hypothyroidism   . Obesity     Past Surgical History:  Procedure Laterality Date  . BREAST BIOPSY Bilateral    1 right, 2 left  . CHOLECYSTECTOMY  22 YRS AGO  . ERCP  20 YRS AGO  . ERCP N/A 04/12/2016   Procedure: ENDOSCOPIC RETROGRADE CHOLANGIOPANCREATOGRAPHY (ERCP);  Surgeon: Irene Shipper, MD;  Location: [facility];  Service: Endoscopy;  Laterality: N/A;  . PLANTAR FASCIA SURGERY Left   . TOENAIL EXCISION Bilateral   . TONSILLECTOMY    . TOTAL ABDOMINAL HYSTERECTOMY     with appendectomy, COMPLETE    Current Facility-Administered Medications  Medication Dose Route Frequency Provider Last Rate Last Dose  . 0.9 %  sodium chloride infusion  500 mL Intravenous Continuous Irene Shipper, MD       Current Outpatient Medications  Medication Sig Dispense Refill Last Dose  . allopurinol (ZYLOPRIM) 300 MG tablet Take 300 mg by mouth daily.    Taking  . co-enzyme Q-10 30 MG capsule  Take 30 mg by mouth daily.   Taking  . fexofenadine (ALLEGRA) 180 MG tablet Take 180 mg by mouth every evening.    Taking  . furosemide (LASIX) 20 MG tablet Take 1 tablet by mouth daily.   Taking  . levothyroxine (SYNTHROID, LEVOTHROID) 100 MCG tablet Take 100 mcg by mouth daily before breakfast.   Taking  . lisinopril-hydrochlorothiazide (PRINZIDE,ZESTORETIC) 20-12.5 MG per tablet Take 1 tablet by mouth daily.    Taking  . methocarbamol (ROBAXIN) 500 MG tablet Take 500 mg by mouth 2 (two) times daily.     Taking  . omeprazole (PRILOSEC) 20 MG capsule Take 20 mg by mouth daily.    Taking  . pravastatin (PRAVACHOL) 40 MG tablet Take 40 mg by mouth daily.     Taking  . ranitidine (ZANTAC) 150 MG tablet Take 150 mg by mouth daily as  needed for heartburn.   Taking  . traMADol (ULTRAM) 50 MG tablet Take 50 mg by mouth every 6 (six) hours as needed for moderate pain.    Taking  . warfarin (COUMADIN) 5 MG tablet Take 2.5-5 mg by mouth every evening. Take 2.5mg  on Tues, Weds, Friday, Sunday Take 5mg  Monday and Thursday   Taking   Allergies  Allergen Reactions  . Adhesive [Tape] Other (See Comments)    Tears skin    Social History   Tobacco Use  . Smoking status: Former Smoker    Types: Cigarettes    Last attempt to quit: 09/12/1998    Years since quitting: 19.0  . Smokeless tobacco: Never Used  Substance Use Topics  . Alcohol use: No    Alcohol/week: 0.0 oz    Family History  Problem Relation Age of Onset  . Diabetes Mother   . Stroke Mother   . Heart disease Father   . Diabetes Father   . Arrhythmia Sister   . Heart disease Sister        x 2  . Breast cancer Sister        x 2  . Diabetes Sister        x 2     Review of Systems  Constitutional: Negative.   HENT: Negative.   Eyes: Negative.   Respiratory: Negative.   Cardiovascular: Negative.   Gastrointestinal: Positive for constipation and heartburn.  Genitourinary: Negative.   Musculoskeletal: Positive for back pain and joint pain.  Skin: Negative.   Neurological: Negative.   Endo/Heme/Allergies: Negative.   Psychiatric/Behavioral: Negative.     Objective:  Physical Exam  Constitutional: She is oriented to person, place, and time. She appears well-developed.  HENT:  Head: Normocephalic.  Mouth/Throat: She has dentures.  Eyes: Pupils are equal, round, and reactive to light.  Neck: Neck supple. No JVD present. No tracheal deviation present. No thyromegaly present.  Cardiovascular: Normal rate, regular rhythm and intact distal pulses.  Respiratory: Effort normal and breath sounds normal. No respiratory distress. She has no wheezes.  GI: Soft. There is no tenderness. There is no guarding.  Musculoskeletal:       Left knee: She exhibits  decreased range of motion, swelling and bony tenderness. She exhibits no ecchymosis, no deformity, no laceration and no erythema. Tenderness found.  Lymphadenopathy:    She has no cervical adenopathy.  Neurological: She is alert and oriented to person, place, and time.  Skin: Skin is warm and dry.  Psychiatric: She has a normal mood and affect.      Labs:  Estimated body mass index is  34.55 kg/m as calculated from the following:   Height as of 10/18/16: 5\' 5"  (1.651 m).   Weight as of 10/18/16: 94.2 kg (207 lb 9.6 oz).   Imaging Review Plain radiographs demonstrate severe degenerative joint disease of the left knee(s).  The bone quality appears to be good for age and reported activity level.  Assessment/Plan:  End stage arthritis, left knee   The patient history, physical examination, clinical judgment of the provider and imaging studies are consistent with end stage degenerative joint disease of the left knee(s) and total knee arthroplasty is deemed medically necessary. The treatment options including medical management, injection therapy arthroscopy and arthroplasty were discussed at length. The risks and benefits of total knee arthroplasty were presented and reviewed. The risks due to aseptic loosening, infection, stiffness, patella tracking problems, thromboembolic complications and other imponderables were discussed. The patient acknowledged the explanation, agreed to proceed with the plan and consent was signed. Patient is being admitted for inpatient treatment for surgery, pain control, PT, OT, prophylactic antibiotics, VTE prophylaxis, progressive ambulation and ADL's and discharge planning. The patient is planning to be discharged home.    [place] P[place] Colby Reels   PA-C  09/08/2017, 8:53 AM

## 2017-09-14 DIAGNOSIS — I129 Hypertensive chronic kidney disease with stage 1 through stage 4 chronic kidney disease, or unspecified chronic kidney disease: Secondary | ICD-10-CM | POA: Diagnosis not present

## 2017-09-14 DIAGNOSIS — M25562 Pain in left knee: Secondary | ICD-10-CM | POA: Diagnosis not present

## 2017-09-14 DIAGNOSIS — I482 Chronic atrial fibrillation: Secondary | ICD-10-CM | POA: Diagnosis not present

## 2017-09-14 DIAGNOSIS — Z01818 Encounter for other preprocedural examination: Secondary | ICD-10-CM | POA: Diagnosis not present

## 2017-09-14 DIAGNOSIS — I10 Essential (primary) hypertension: Secondary | ICD-10-CM | POA: Diagnosis not present

## 2017-09-14 DIAGNOSIS — M25561 Pain in right knee: Secondary | ICD-10-CM | POA: Diagnosis not present

## 2017-09-14 DIAGNOSIS — E038 Other specified hypothyroidism: Secondary | ICD-10-CM | POA: Diagnosis not present

## 2017-09-14 DIAGNOSIS — K219 Gastro-esophageal reflux disease without esophagitis: Secondary | ICD-10-CM | POA: Diagnosis not present

## 2017-09-14 DIAGNOSIS — Z6836 Body mass index (BMI) 36.0-36.9, adult: Secondary | ICD-10-CM | POA: Diagnosis not present

## 2017-09-18 ENCOUNTER — Other Ambulatory Visit (HOSPITAL_COMMUNITY): Payer: Self-pay | Admitting: Emergency Medicine

## 2017-09-18 NOTE — Progress Notes (Signed)
lov cardiology dr peter Martinique 10-18-16 epic  ekg 10-18-16 epic

## 2017-09-18 NOTE — Patient Instructions (Addendum)
[name] [name]  09/18/2017   Your procedure is scheduled on: 09-25-17  Report to [facility] Main  Entrance    Follow signs to Short Stay on first floor at 530 AM    Call this number if you have problems the morning of surgery [phone]     Remember: Do not eat food or drink liquids :After Midnight.     Take these medicines the morning of surgery with A SIP OF WATER: allopurinol, levothyroxine, omeprazole                               You may not have any metal on your body including hair pins and              piercings  Do not wear jewelry, make-up, lotions, powders or perfumes, deodorant             Do not wear nail polish.  Do not shave  48 hours prior to surgery.             Do not bring valuables to the hospital. [name] [name].  Contacts, dentures or bridgework may not be worn into surgery.  Leave suitcase in the car. After surgery it may be brought to your room.               Please read over the following fact sheets you were given: _____________________________________________________________________             [facility] - Preparing for Surgery Before surgery, you can play an important role.  Because skin is not sterile, your skin needs to be as free of germs as possible.  You can reduce the number of germs on your skin by washing with CHG (chlorahexidine gluconate) soap before surgery.  CHG is an antiseptic cleaner which kills germs and bonds with the skin to continue killing germs even after washing. Please DO NOT use if you have an allergy to CHG or antibacterial soaps.  If your skin becomes reddened/irritated stop using the CHG and inform your nurse when you arrive at Short Stay. Do not shave (including legs and underarms) for at least 48 hours prior to the first CHG shower.  You may shave your face/neck. Please follow these instructions carefully:  1.  Shower with CHG Soap the night before  surgery and the  morning of Surgery.  2.  If you choose to wash your hair, wash your hair first as usual with your  normal  shampoo.  3.  After you shampoo, rinse your hair and body thoroughly to remove the  shampoo.                           4.  Use CHG as you would any other liquid soap.  You can apply chg directly  to the skin and wash                       Gently with a scrungie or clean washcloth.  5.  Apply the CHG Soap to your body ONLY FROM THE NECK DOWN.   Do not use on face/ open  Wound or open sores. Avoid contact with eyes, ears mouth and genitals (private parts).                       Wash face,  Genitals (private parts) with your normal soap.             6.  Wash thoroughly, paying special attention to the area where your surgery  will be performed.  7.  Thoroughly rinse your body with warm water from the neck down.  8.  DO NOT shower/wash with your normal soap after using and rinsing off  the CHG Soap.                9.  Pat yourself dry with a clean towel.            10.  Wear clean pajamas.            11.  Place clean sheets on your bed the night of your first shower and do not  sleep with pets. Day of Surgery : Do not apply any lotions/deodorants the morning of surgery.  Please wear clean clothes to the hospital/surgery center.  FAILURE TO FOLLOW THESE INSTRUCTIONS MAY RESULT IN THE CANCELLATION OF YOUR SURGERY PATIENT SIGNATURE_________________________________  NURSE SIGNATURE__________________________________  ________________________________________________________________________   [name] [name]  An incentive spirometer is a tool that can help keep your lungs clear and active. This tool measures how well you are filling your lungs with each breath. Taking long deep breaths may help reverse or decrease the chance of developing breathing (pulmonary) problems (especially infection) following:  A long period of time when you are unable to  move or be active. BEFORE THE PROCEDURE   If the spirometer includes an indicator to show your best effort, your nurse or respiratory therapist will set it to a desired goal.  If possible, sit up straight or lean slightly forward. Try not to slouch.  Hold the incentive spirometer in an upright position. INSTRUCTIONS FOR USE  1. Sit on the edge of your bed if possible, or sit up as far as you can in bed or on a chair. 2. Hold the incentive spirometer in an upright position. 3. Breathe out normally. 4. Place the mouthpiece in your mouth and seal your lips tightly around it. 5. Breathe in slowly and as deeply as possible, raising the piston or the ball toward the top of the column. 6. Hold your breath for 3-5 seconds or for as long as possible. Allow the piston or ball to fall to the bottom of the column. 7. Remove the mouthpiece from your mouth and breathe out normally. 8. Rest for a few seconds and repeat Steps 1 through 7 at least 10 times every 1-2 hours when you are awake. Take your time and take a few normal breaths between deep breaths. 9. The spirometer may include an indicator to show your best effort. Use the indicator as a goal to work toward during each repetition. 10. After each set of 10 deep breaths, practice coughing to be sure your lungs are clear. If you have an incision (the cut made at the time of surgery), support your incision when coughing by placing a pillow or rolled up towels firmly against it. Once you are able to get out of bed, walk around indoors and cough well. You may stop using the incentive spirometer when instructed by your caregiver.  RISKS AND COMPLICATIONS  Take your time so you do not get  dizzy or light-headed.  If you are in pain, you may need to take or ask for pain medication before doing incentive spirometry. It is harder to take a deep breath if you are having pain. AFTER USE  Rest and breathe slowly and easily.  It can be helpful to keep track of  a log of your progress. Your caregiver can provide you with a simple table to help with this. If you are using the spirometer at home, follow these instructions: [place] IF:   You are having difficultly using the spirometer.  You have trouble using the spirometer as often as instructed.  Your pain medication is not giving enough relief while using the spirometer.  You develop fever of 100.5 F (38.1 C) or higher. SEEK IMMEDIATE MEDICAL CARE IF:   You cough up bloody sputum that had not been present before.  You develop fever of 102 F (38.9 C) or greater.  You develop worsening pain at or near the incision site. MAKE SURE YOU:   Understand these instructions.  Will watch your condition.  Will get help right away if you are not doing well or get worse. Document Released: 01/09/2007 Document Revised: 11/21/2011 Document Reviewed: 03/12/2007 Exit[employer] Patient Information 2014 Exit[employer], [place].   ________________________________________________________________________  WHAT IS A BLOOD TRANSFUSION? Blood Transfusion Information  A transfusion is the replacement of blood or some of its parts. Blood is made up of multiple cells which provide different functions.  Red blood cells carry oxygen and are used for blood loss replacement.  White blood cells fight against infection.  Platelets control bleeding.  Plasma helps clot blood.  Other blood products are available for specialized needs, such as hemophilia or other clotting disorders. BEFORE THE TRANSFUSION  Who gives blood for transfusions?   Healthy volunteers who are fully evaluated to make sure their blood is safe. This is blood bank blood. Transfusion therapy is the safest it has ever been in the practice of medicine. Before blood is taken from a donor, a complete history is taken to make sure that person has no history of diseases nor engages in risky social behavior (examples are intravenous drug use or sexual  activity with multiple partners). The donor's travel history is screened to minimize risk of transmitting infections, such as malaria. The donated blood is tested for signs of infectious diseases, such as HIV and hepatitis. The blood is then tested to be sure it is compatible with you in order to minimize the chance of a transfusion reaction. If you or a relative donates blood, this is often done in anticipation of surgery and is not appropriate for emergency situations. It takes many days to process the donated blood. RISKS AND COMPLICATIONS Although transfusion therapy is very safe and saves many lives, the main dangers of transfusion include:   Getting an infectious disease.  Developing a transfusion reaction. This is an allergic reaction to something in the blood you were given. Every precaution is taken to prevent this. The decision to have a blood transfusion has been considered carefully by your caregiver before blood is given. Blood is not given unless the benefits outweigh the risks. AFTER THE TRANSFUSION  Right after receiving a blood transfusion, you will usually feel much better and more energetic. This is especially true if your red blood cells have gotten low (anemic). The transfusion raises the level of the red blood cells which carry oxygen, and this usually causes an energy increase.  The nurse administering the transfusion will  monitor you carefully for complications. HOME CARE INSTRUCTIONS  No special instructions are needed after a transfusion. You may find your energy is better. Speak with your caregiver about any limitations on activity for underlying diseases you may have. SEEK MEDICAL CARE IF:   Your condition is not improving after your transfusion.  You develop redness or irritation at the intravenous (IV) site. SEEK IMMEDIATE MEDICAL CARE IF:  Any of the following symptoms occur over the next 12 hours:  Shaking chills.  You have a temperature by mouth above 102 F  (38.9 C), not controlled by medicine.  Chest, back, or muscle pain.  People around you feel you are not acting correctly or are confused.  Shortness of breath or difficulty breathing.  Dizziness and fainting.  You get a rash or develop hives.  You have a decrease in urine output.  Your urine turns a dark color or changes to pink, red, or brown. Any of the following symptoms occur over the next 10 days:  You have a temperature by mouth above 102 F (38.9 C), not controlled by medicine.  Shortness of breath.  Weakness after normal activity.  The white part of the eye turns yellow (jaundice).  You have a decrease in the amount of urine or are urinating less often.  Your urine turns a dark color or changes to pink, red, or brown. Document Released: 08/26/2000 Document Revised: 11/21/2011 Document Reviewed: 04/14/2008 [facility] Patient Information 2014 [place], [place].  _______________________________________________________________________

## 2017-09-19 ENCOUNTER — Other Ambulatory Visit: Payer: Self-pay

## 2017-09-19 ENCOUNTER — Encounter (HOSPITAL_COMMUNITY): Payer: Self-pay

## 2017-09-19 ENCOUNTER — Telehealth: Payer: Self-pay | Admitting: Cardiology

## 2017-09-19 ENCOUNTER — Encounter (HOSPITAL_COMMUNITY)
Admission: RE | Admit: 2017-09-19 | Discharge: 2017-09-19 | Disposition: A | Discharge status: Home / Self Care | Payer: Medicare HMO | Source: Ambulatory Visit | Attending: Orthopedic Surgery | Admitting: Orthopedic Surgery

## 2017-09-19 DIAGNOSIS — Z01812 Encounter for preprocedural laboratory examination: Secondary | ICD-10-CM | POA: Insufficient documentation

## 2017-09-19 DIAGNOSIS — I4891 Unspecified atrial fibrillation: Secondary | ICD-10-CM | POA: Insufficient documentation

## 2017-09-19 DIAGNOSIS — Z01810 Encounter for preprocedural cardiovascular examination: Secondary | ICD-10-CM | POA: Insufficient documentation

## 2017-09-19 LAB — COMPREHENSIVE METABOLIC PANEL
ALK PHOS: 144 U/L — AB (ref 38–126)
ALT: 24 U/L (ref 14–54)
ANION GAP: 7 (ref 5–15)
AST: 27 U/L (ref 15–41)
Albumin: 3.9 g/dL (ref 3.5–5.0)
BUN: 58 mg/dL — ABNORMAL HIGH (ref 6–20)
CALCIUM: 9.5 mg/dL (ref 8.9–10.3)
CHLORIDE: 104 mmol/L (ref 101–111)
CO2: 28 mmol/L (ref 22–32)
CREATININE: 1.4 mg/dL — AB (ref 0.44–1.00)
GFR, EST AFRICAN AMERICAN: 42 mL/min — AB (ref >60)
GFR, EST NON AFRICAN AMERICAN: 36 mL/min — AB (ref >60)
Glucose, Bld: 97 mg/dL (ref 65–99)
Potassium: 4.7 mmol/L (ref 3.5–5.1)
SODIUM: 139 mmol/L (ref 135–145)
Total Bilirubin: 0.7 mg/dL (ref 0.3–1.2)
Total Protein: 7.2 g/dL (ref 6.5–8.1)

## 2017-09-19 LAB — CBC
HCT: 36.9 % (ref 36.0–46.0)
HEMOGLOBIN: 12.1 g/dL (ref 12.0–15.0)
MCH: 31.6 pg (ref 26.0–34.0)
MCHC: 32.8 g/dL (ref 30.0–36.0)
MCV: 96.3 fL (ref 78.0–100.0)
Platelets: 205 10*3/uL (ref 150–400)
RBC: 3.83 MIL/uL — AB (ref 3.87–5.11)
RDW: 14.6 % (ref 11.5–15.5)
WBC: 8.5 10*3/uL (ref 4.0–10.5)

## 2017-09-19 LAB — SURGICAL PCR SCREEN
MRSA, PCR: NEGATIVE
STAPHYLOCOCCUS AUREUS: NEGATIVE

## 2017-09-19 NOTE — Progress Notes (Signed)
Consult with anesthesia. RN spoke with Dr Ola Spurr face to face to make aware of epic note by cardiology (see 08-25-17 telephone note ) stating that Mrs Atha needed to see cardio in office before proceeding with surgery. Per Dr Ola Spurr; agrees; patient needs to see cardiology in office before proceeding.  Patient made aware. verbalized understanding. RN called and LVMM with Orson Slick at [facility] to make aware also.

## 2017-09-19 NOTE — Telephone Encounter (Signed)
New message  Pt verbalized that she is calling for RN

## 2017-09-19 NOTE — Telephone Encounter (Signed)
Spoke to patient . She was calling to see if she had clearance for pending surgery on 09/25/17.  after reviewing telephone note from 08/25/17 -  States patient needs office visit for clearance . Patient made aware - first available  appointment schedule for 09/20/17 at 11 am with extender. Patient verbalized understanding.

## 2017-09-20 ENCOUNTER — Encounter: Payer: Self-pay | Admitting: Physician Assistant

## 2017-09-20 ENCOUNTER — Ambulatory Visit: Payer: Medicare HMO | Admitting: Physician Assistant

## 2017-09-20 VITALS — BP 118/72 | HR 70 | Ht 64.0 in | Wt 217.0 lb

## 2017-09-20 DIAGNOSIS — I482 Chronic atrial fibrillation: Secondary | ICD-10-CM | POA: Diagnosis not present

## 2017-09-20 DIAGNOSIS — Z01810 Encounter for preprocedural cardiovascular examination: Secondary | ICD-10-CM | POA: Diagnosis not present

## 2017-09-20 DIAGNOSIS — I10 Essential (primary) hypertension: Secondary | ICD-10-CM

## 2017-09-20 DIAGNOSIS — E785 Hyperlipidemia, unspecified: Secondary | ICD-10-CM

## 2017-09-20 DIAGNOSIS — I4821 Permanent atrial fibrillation: Secondary | ICD-10-CM

## 2017-09-20 DIAGNOSIS — E039 Hypothyroidism, unspecified: Secondary | ICD-10-CM | POA: Diagnosis not present

## 2017-09-20 LAB — ABO/RH: ABO/RH(D): AB NEG

## 2017-09-20 NOTE — Progress Notes (Signed)
Cardiology Office Note    Date:  09/22/2017   ID:  [name] [name], DOB 1945/12/25, MRN [id number]  PCP:  [name], MD  Cardiologist:  Dr. Martinique  Chief Complaint  Patient presents with  . Medical Clearance    preop clearance requested by Dr. Alvan Dame prior to L knee surgery    History of Present Illness:  [name] [name] is a 72 y.o. female with PMH of permanent atrial fibrillation on Coumadin, hyperlipidemia, GERD, hypertension and hypothyroidism.  Patient had a prior Holter monitor that showed normal heart rate, ranges 38-139, longest pause 2.5 seconds.  Echocardiogram in 2010 showed a normal ejection fraction.  In August 2017, she had a ERCP for common duct stone.  In November she had colonoscopy with finding of 3 polyps.  Otherwise she has been doing well on the current therapy.  Patient presents today for evaluation of preoperative clearance requested by Dr. Alvan Dame for left total knee surgery.  She denies any recent chest pain, shortness of breath, lower extremity edema, orthopnea or PND. Her atrial fibrillation is well rate controlled.  Coumadin level has been checked by her primary care provider.  I have checked with our clinical pharmacist, given the fact she never had a stroke, she does not need Lovenox bridging.  I also discussed with DOD Dr. Claiborne Billings, given the fact that she is able to complete at least 4 METs without any issue, no further workup is needed prior to the surgery.  She has never had any prior diagnosis of CAD.    Past Medical History:  Diagnosis Date  . AF (atrial fibrillation) ([place])   . Arthritis   . Cholelithiasis   . Colon polyps   . Diverticulosis   . DJD (degenerative joint disease)   . Dyslipidemia   . Fatty liver disease, nonalcoholic   . GERD (gastroesophageal reflux disease)   . Gout   . 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 Hypertension   . Hypothyroidism   . Obesity     Past Surgical History:  Procedure Laterality Date  . BREAST BIOPSY Bilateral    1  right, 2 left  . CHOLECYSTECTOMY  22 YRS AGO  . ERCP  20 YRS AGO  . ERCP N/A 04/12/2016   Procedure: ENDOSCOPIC RETROGRADE CHOLANGIOPANCREATOGRAPHY (ERCP);  Surgeon: Irene Shipper, MD;  Location: [facility];  Service: Endoscopy;  Laterality: N/A;  . PLANTAR FASCIA SURGERY Left   . TOENAIL EXCISION Bilateral   . TONSILLECTOMY    . TOTAL ABDOMINAL HYSTERECTOMY     with appendectomy, COMPLETE    Current Medications: Outpatient Medications Prior to Visit  Medication Sig Dispense Refill  . allopurinol (ZYLOPRIM) 300 MG tablet Take 300 mg by mouth daily.     Marland Kitchen COENZYME Q10 PO Take 1 capsule by mouth daily.     . fexofenadine (ALLEGRA) 180 MG tablet Take 180 mg by mouth every evening.     . furosemide (LASIX) 20 MG tablet Take 20 mg by mouth daily.     Marland Kitchen levothyroxine (SYNTHROID, LEVOTHROID) 100 MCG tablet Take 100 mcg by mouth daily before breakfast.    . lisinopril-hydrochlorothiazide (PRINZIDE,ZESTORETIC) 20-12.5 MG per tablet Take 1 tablet by mouth daily.     . methocarbamol (ROBAXIN) 500 MG tablet Take 500 mg by mouth 2 (two) times daily.      Marland Kitchen omeprazole (PRILOSEC) 20 MG capsule Take 20 mg by mouth daily.     Marland Kitchen OVER THE COUNTER MEDICATION Apply 1 application topically 5 (  five) times daily as needed (for rash). Greer's Goo Compound Cream    . OVER THE COUNTER MEDICATION Take 1 tablet by mouth daily. [employer] Study Drug Supplement and Multivitamin    . pravastatin (PRAVACHOL) 40 MG tablet Take 40 mg by mouth at bedtime.     . traMADol (ULTRAM) 50 MG tablet Take 50 mg by mouth every 6 (six) hours as needed for moderate pain.     Marland Kitchen warfarin (COUMADIN) 5 MG tablet Take 2.5-5 mg by mouth See admin instructions. Take 5 mg by mouth daily on Monday and Friday. Take 2.5 mg by mouth daily on all other days     Facility-Administered Medications Prior to Visit  Medication Dose Route Frequency Provider Last Rate Last Dose  . 0.9 %  sodium chloride infusion  500 mL Intravenous Continuous Irene Shipper,  MD         Allergies:   Adhesive [tape]   Social History   Socioeconomic History  . Marital status: Married    Spouse name: None  . Number of children: 0  . Years of education: None  . Highest education level: None  Social Needs  . Financial resource strain: None  . Food insecurity - worry: None  . Food insecurity - inability: None  . Transportation needs - medical: None  . Transportation needs - non-medical: None  Occupational History  . Occupation: mills    Comment: retired  Tobacco Use  . Smoking status: Former Smoker    Types: Cigarettes    Last attempt to quit: 09/12/1998    Years since quitting: 19.0  . Smokeless tobacco: Never Used  Substance and Sexual Activity  . Alcohol use: No    Alcohol/week: 0.0 oz  . Drug use: No  . Sexual activity: None  Other Topics Concern  . None  Social History Narrative  . None     Family History:  The patient's family history includes Arrhythmia in her sister; Breast cancer in her sister; Diabetes in her father, mother, and sister; Heart disease in her father and sister; Stroke in her mother.   ROS:   Please see the history of present illness.    ROS All other systems reviewed and are negative.   PHYSICAL EXAM:   VS:  BP 118/72   Pulse 70   Ht 5\' 4"  (1.626 m)   Wt 217 lb (98.4 kg)   BMI 37.25 kg/m    GEN: Well nourished, well developed, in no acute distress  HEENT: normal  Neck: no JVD, carotid bruits, or masses Cardiac: RRR; no murmurs, rubs, or gallops,no edema  Respiratory:  clear to auscultation bilaterally, normal work of breathing GI: soft, nontender, nondistended, + BS MS: no deformity or atrophy  Skin: warm and dry, no rash Neuro:  Alert and Oriented x 3, Strength and sensation are intact Psych: euthymic mood, full affect  Wt Readings from Last 3 Encounters:  09/20/17 217 lb (98.4 kg)  09/19/17 216 lb (98 kg)  10/18/16 207 lb 9.6 oz (94.2 kg)      Studies/Labs Reviewed:   EKG:  EKG is not ordered  today.   Recent Labs: 09/19/2017: ALT 24; BUN 58; Creatinine, Ser 1.40; Hemoglobin 12.1; Platelets 205; Potassium 4.7; Sodium 139   Lipid Panel No results found for: CHOL, TRIG, HDL, CHOLHDL, VLDL, LDLCALC, LDLDIRECT  Additional studies/ records that were reviewed today include:   Echo 12/22/2008     ASSESSMENT:    1. Preop cardiovascular exam   2. Permanent  atrial fibrillation ([place])   3. Hyperlipidemia, unspecified hyperlipidemia type   4. Essential hypertension   5. Hypothyroidism, unspecified type      PLAN:  In order of problems listed above:  1. Preoperative clearance: Patient does not have prior history of CAD, she is able to complete at least 4 METs are any issue, she is cleared to proceed with surgery without further workup.  I have discussed with DOD Dr. Claiborne Billings who also agreed as well.  I also checked with our clinical pharmacist regarding her permanent atrial fibrillation and Coumadin, her Coumadin is being managed by primary care provider, given her CHA2DS2-Vasc score of 3, he does not need Lovenox bridging  2. Permanent atrial fibrillation: Rate controlled, on Coumadin managed by her primary care provider's office. CHA2DS2-Vasc score 3 (female, age, HTN)  3. Hypertension: Blood pressure well controlled  4. Hyperlipidemia: Continue Pravachol 40 mg daily, lab work being followed by primary care provider  5. Hypothyroidism: Managed by primary care provider    Medication Adjustments/Labs and Tests Ordered: Current medicines are reviewed at length with the patient today.  Concerns regarding medicines are outlined above.  Medication changes, Labs and Tests ordered today are listed in the Patient Instructions below. Patient Instructions  Medication Instructions:  Continue current medications  If you need a refill on your cardiac medications before your next appointment, please call your pharmacy.  Labwork: None Ordered   Testing/Procedures: None Ordered  Special  Instructions: You are cleared for Surgery  Follow-Up: Your physician wants you to follow-up in: 1 Year with Dr Martinique. You should receive a reminder letter in the mail two months in advance. If you do not receive a letter, please call our office [phone].    Thank you for choosing [facility] at [employer], [place]  09/22/2017 11:16 AM    [address], [place], [place]  [address] Phone: [phone]; Fax: [phone]

## 2017-09-20 NOTE — Progress Notes (Signed)
Spoke to [employer], pt has an appt scheduled today at 11AM with cardiology for cardiac clearance. Judeen Hammans will also send note to provider to have correct order for consent added. Pt is scheduled to have a L TKA on 09/25/17  Order currently in [place] is for a R TKA.

## 2017-09-20 NOTE — Patient Instructions (Signed)
Medication Instructions:  Continue current medications  If you need a refill on your cardiac medications before your next appointment, please call your pharmacy.  Labwork: None Ordered   Testing/Procedures: None Ordered  Special Instructions: You are cleared for Surgery  Follow-Up: Your physician wants you to follow-up in: 1 Year with Dr Martinique. You should receive a reminder letter in the mail two months in advance. If you do not receive a letter, please call our office [phone].    Thank you for choosing [facility] at [facility]!!

## 2017-09-21 NOTE — Progress Notes (Signed)
Cardiac clearance 09-20-17 on chart [name] [name] , [place]

## 2017-09-22 ENCOUNTER — Encounter: Payer: Self-pay | Admitting: Physician Assistant

## 2017-09-24 MED ORDER — TRANEXAMIC ACID 1000 MG/10ML IV SOLN
1000.0000 mg | INTRAVENOUS | Status: AC
  Administered 2017-09-25: 1000 mg via INTRAVENOUS
  Filled 2017-09-24: qty 1100

## 2017-09-24 NOTE — Anesthesia Preprocedure Evaluation (Signed)
Anesthesia Evaluation  Patient identified by MRN, date of birth, ID band Patient awake    Reviewed: Allergy & Precautions, NPO status , Patient's Chart, lab work & pertinent test results  Airway Mallampati: II  TM Distance: >3 FB Neck ROM: Full    Dental no notable dental hx. (+) Edentulous Upper, Edentulous Lower   Pulmonary neg pulmonary ROS, former smoker,    Pulmonary exam normal breath sounds clear to auscultation       Cardiovascular hypertension, Pt. on medications Normal cardiovascular exam+ dysrhythmias Atrial Fibrillation  Rhythm:Regular Rate:Normal     Neuro/Psych negative neurological ROS  negative psych ROS   GI/Hepatic GERD  ,(+) Hepatitis -  Endo/Other  Hypothyroidism   Renal/GU negative Renal ROS     Musculoskeletal  (+) Arthritis ,   Abdominal   Peds  Hematology negative hematology ROS (+)   Anesthesia Other Findings   Reproductive/Obstetrics negative OB ROS                             Anesthesia Physical Anesthesia Plan  ASA: III  Anesthesia Plan: Spinal   Post-op Pain Management:  Regional for Post-op pain   Induction: Intravenous  PONV Risk Score and Plan: 2 and Ondansetron and Propofol infusion  Airway Management Planned: Simple Face Mask  Additional Equipment:   Intra-op Plan:   Post-operative Plan:   Informed Consent: I have reviewed the patients History and Physical, chart, labs and discussed the procedure including the risks, benefits and alternatives for the proposed anesthesia with the patient or authorized representative who has indicated his/her understanding and acceptance.   Dental advisory given  Plan Discussed with: CRNA  Anesthesia Plan Comments:         Anesthesia Quick Evaluation

## 2017-09-25 ENCOUNTER — Other Ambulatory Visit: Payer: Self-pay

## 2017-09-25 ENCOUNTER — Encounter (HOSPITAL_COMMUNITY): Payer: Self-pay | Admitting: *Deleted

## 2017-09-25 ENCOUNTER — Inpatient Hospital Stay (HOSPITAL_COMMUNITY): Payer: Medicare HMO | Admitting: Certified Registered Nurse Anesthetist

## 2017-09-25 ENCOUNTER — Encounter (HOSPITAL_COMMUNITY)
Admission: RE | Disposition: A | Discharge status: Home / Self Care | Payer: Self-pay | Source: Ambulatory Visit | Attending: Orthopedic Surgery

## 2017-09-25 ENCOUNTER — Observation Stay (HOSPITAL_COMMUNITY)
Admission: RE | Admit: 2017-09-25 | Discharge: 2017-09-26 | Disposition: A | Discharge status: Home / Self Care | Payer: Medicare HMO | Source: Ambulatory Visit | Attending: Orthopedic Surgery | Admitting: Orthopedic Surgery

## 2017-09-25 DIAGNOSIS — Z79899 Other long term (current) drug therapy: Secondary | ICD-10-CM | POA: Diagnosis not present

## 2017-09-25 DIAGNOSIS — M1712 Unilateral primary osteoarthritis, left knee: Secondary | ICD-10-CM | POA: Diagnosis not present

## 2017-09-25 DIAGNOSIS — Z7901 Long term (current) use of anticoagulants: Secondary | ICD-10-CM | POA: Diagnosis not present

## 2017-09-25 DIAGNOSIS — I10 Essential (primary) hypertension: Secondary | ICD-10-CM | POA: Diagnosis not present

## 2017-09-25 DIAGNOSIS — E669 Obesity, unspecified: Secondary | ICD-10-CM | POA: Insufficient documentation

## 2017-09-25 DIAGNOSIS — M25462 Effusion, left knee: Secondary | ICD-10-CM | POA: Diagnosis not present

## 2017-09-25 DIAGNOSIS — M109 Gout, unspecified: Secondary | ICD-10-CM | POA: Insufficient documentation

## 2017-09-25 DIAGNOSIS — E7800 Pure hypercholesterolemia, unspecified: Secondary | ICD-10-CM | POA: Insufficient documentation

## 2017-09-25 DIAGNOSIS — Z96651 Presence of right artificial knee joint: Secondary | ICD-10-CM | POA: Insufficient documentation

## 2017-09-25 DIAGNOSIS — Z888 Allergy status to other drugs, medicaments and biological substances status: Secondary | ICD-10-CM | POA: Diagnosis not present

## 2017-09-25 DIAGNOSIS — Z86010 Personal history of colonic polyps: Secondary | ICD-10-CM | POA: Diagnosis not present

## 2017-09-25 DIAGNOSIS — M25762 Osteophyte, left knee: Secondary | ICD-10-CM | POA: Diagnosis not present

## 2017-09-25 DIAGNOSIS — E039 Hypothyroidism, unspecified: Secondary | ICD-10-CM | POA: Diagnosis not present

## 2017-09-25 DIAGNOSIS — M65862 Other synovitis and tenosynovitis, left lower leg: Secondary | ICD-10-CM | POA: Insufficient documentation

## 2017-09-25 DIAGNOSIS — I4891 Unspecified atrial fibrillation: Secondary | ICD-10-CM | POA: Diagnosis not present

## 2017-09-25 DIAGNOSIS — G8918 Other acute postprocedural pain: Secondary | ICD-10-CM | POA: Diagnosis not present

## 2017-09-25 DIAGNOSIS — Z6837 Body mass index (BMI) 37.0-37.9, adult: Secondary | ICD-10-CM | POA: Diagnosis not present

## 2017-09-25 DIAGNOSIS — K219 Gastro-esophageal reflux disease without esophagitis: Secondary | ICD-10-CM | POA: Diagnosis not present

## 2017-09-25 DIAGNOSIS — K760 Fatty (change of) liver, not elsewhere classified: Secondary | ICD-10-CM | POA: Insufficient documentation

## 2017-09-25 DIAGNOSIS — Z87891 Personal history of nicotine dependence: Secondary | ICD-10-CM | POA: Diagnosis not present

## 2017-09-25 DIAGNOSIS — Z96659 Presence of unspecified artificial knee joint: Secondary | ICD-10-CM

## 2017-09-25 DIAGNOSIS — Z96652 Presence of left artificial knee joint: Secondary | ICD-10-CM

## 2017-09-25 HISTORY — PX: TOTAL KNEE ARTHROPLASTY: SHX125

## 2017-09-25 LAB — CBC
HCT: 36 % (ref 36.0–46.0)
Hemoglobin: 11.1 g/dL — ABNORMAL LOW (ref 12.0–15.0)
MCH: 29.6 pg (ref 26.0–34.0)
MCHC: 30.8 g/dL (ref 30.0–36.0)
MCV: 96 fL (ref 78.0–100.0)
PLATELETS: 191 10*3/uL (ref 150–400)
RBC: 3.75 MIL/uL — AB (ref 3.87–5.11)
RDW: 14.3 % (ref 11.5–15.5)
WBC: 10.5 10*3/uL (ref 4.0–10.5)

## 2017-09-25 LAB — PROTIME-INR
INR: 1.11
Prothrombin Time: 14.3 seconds (ref 11.4–15.2)

## 2017-09-25 LAB — TYPE AND SCREEN
ABO/RH(D): AB NEG
ANTIBODY SCREEN: NEGATIVE

## 2017-09-25 LAB — CREATININE, SERUM
CREATININE: 1.81 mg/dL — AB (ref 0.44–1.00)
GFR calc non Af Amer: 27 mL/min — ABNORMAL LOW (ref >60)
GFR, EST AFRICAN AMERICAN: 31 mL/min — AB (ref >60)

## 2017-09-25 SURGERY — ARTHROPLASTY, KNEE, TOTAL
Anesthesia: Spinal | Site: Knee | Laterality: Left

## 2017-09-25 MED ORDER — DEXAMETHASONE SODIUM PHOSPHATE 10 MG/ML IJ SOLN
10.0000 mg | Freq: Once | INTRAMUSCULAR | Status: AC
  Administered 2017-09-26: 10 mg via INTRAVENOUS
  Filled 2017-09-25: qty 1

## 2017-09-25 MED ORDER — SODIUM CHLORIDE 0.9 % IJ SOLN
INTRAMUSCULAR | Status: AC
  Filled 2017-09-25: qty 50

## 2017-09-25 MED ORDER — CEFAZOLIN SODIUM-DEXTROSE 2-4 GM/100ML-% IV SOLN
2.0000 g | INTRAVENOUS | Status: AC
  Administered 2017-09-25: 2 g via INTRAVENOUS
  Filled 2017-09-25: qty 100

## 2017-09-25 MED ORDER — PRAVASTATIN SODIUM 20 MG PO TABS
40.0000 mg | ORAL_TABLET | Freq: Every day | ORAL | Status: DC
  Administered 2017-09-25: 40 mg via ORAL
  Filled 2017-09-25: qty 2

## 2017-09-25 MED ORDER — DIPHENHYDRAMINE HCL 12.5 MG/5ML PO ELIX: 12.5000 mg | ORAL_SOLUTION | ORAL | Status: DC | PRN

## 2017-09-25 MED ORDER — WARFARIN SODIUM 5 MG PO TABS
5.0000 mg | ORAL_TABLET | Freq: Once | ORAL | Status: AC
  Administered 2017-09-25: 19:00:00 5 mg via ORAL
  Filled 2017-09-25: qty 1

## 2017-09-25 MED ORDER — METOCLOPRAMIDE HCL 5 MG PO TABS: 5.0000 mg | ORAL_TABLET | Freq: Three times a day (TID) | ORAL | Status: DC | PRN

## 2017-09-25 MED ORDER — POLYETHYLENE GLYCOL 3350 17 G PO PACK: 17.0000 g | PACK | Freq: Two times a day (BID) | ORAL | 0 refills | Status: DC

## 2017-09-25 MED ORDER — FERROUS SULFATE 325 (65 FE) MG PO TABS: 325.0000 mg | ORAL_TABLET | Freq: Three times a day (TID) | ORAL | 3 refills | Status: DC

## 2017-09-25 MED ORDER — CEFAZOLIN SODIUM-DEXTROSE 2-4 GM/100ML-% IV SOLN
2.0000 g | Freq: Four times a day (QID) | INTRAVENOUS | Status: AC
  Administered 2017-09-25 (×2): 2 g via INTRAVENOUS
  Filled 2017-09-25 (×3): qty 100

## 2017-09-25 MED ORDER — WARFARIN - PHARMACIST DOSING INPATIENT: Freq: Every day | Status: DC

## 2017-09-25 MED ORDER — LEVOTHYROXINE SODIUM 100 MCG PO TABS
100.0000 ug | ORAL_TABLET | Freq: Every day | ORAL | Status: DC
  Administered 2017-09-26: 08:00:00 100 ug via ORAL
  Filled 2017-09-25: qty 1

## 2017-09-25 MED ORDER — ALUM & MAG HYDROXIDE-SIMETH 200-200-20 MG/5ML PO SUSP: 15.0000 mL | ORAL | Status: DC | PRN

## 2017-09-25 MED ORDER — SODIUM CHLORIDE 0.9 % IJ SOLN
INTRAMUSCULAR | Status: DC | PRN
  Administered 2017-09-25: 30 mL

## 2017-09-25 MED ORDER — FENTANYL CITRATE (PF) 100 MCG/2ML IJ SOLN
INTRAMUSCULAR | Status: AC
  Filled 2017-09-25: qty 4

## 2017-09-25 MED ORDER — HYDROMORPHONE HCL 1 MG/ML IJ SOLN: 0.2500 mg | INTRAMUSCULAR | Status: DC | PRN

## 2017-09-25 MED ORDER — FUROSEMIDE 20 MG PO TABS
20.0000 mg | ORAL_TABLET | Freq: Every day | ORAL | Status: DC
  Administered 2017-09-25: 12:00:00 20 mg via ORAL
  Filled 2017-09-25: qty 1

## 2017-09-25 MED ORDER — ONDANSETRON HCL 4 MG PO TABS: 4.0000 mg | ORAL_TABLET | Freq: Four times a day (QID) | ORAL | Status: DC | PRN

## 2017-09-25 MED ORDER — ALLOPURINOL 300 MG PO TABS: 300.0000 mg | ORAL_TABLET | Freq: Every day | ORAL | Status: DC

## 2017-09-25 MED ORDER — ONDANSETRON HCL 4 MG/2ML IJ SOLN
INTRAMUSCULAR | Status: DC | PRN
  Administered 2017-09-25: 4 mg via INTRAVENOUS

## 2017-09-25 MED ORDER — METHOCARBAMOL 500 MG PO TABS: 500.0000 mg | ORAL_TABLET | Freq: Four times a day (QID) | ORAL | 0 refills | Status: DC | PRN

## 2017-09-25 MED ORDER — BUPIVACAINE-EPINEPHRINE (PF) 0.5% -1:200000 IJ SOLN
INTRAMUSCULAR | Status: AC
  Filled 2017-09-25: qty 30

## 2017-09-25 MED ORDER — KETOROLAC TROMETHAMINE 30 MG/ML IJ SOLN
INTRAMUSCULAR | Status: AC
  Filled 2017-09-25: qty 1

## 2017-09-25 MED ORDER — PROPOFOL 10 MG/ML IV BOLUS
INTRAVENOUS | Status: DC | PRN
  Administered 2017-09-25: 40 mg via INTRAVENOUS
  Administered 2017-09-25: 10 mg via INTRAVENOUS

## 2017-09-25 MED ORDER — HYDROMORPHONE HCL 1 MG/ML IJ SOLN: 0.5000 mg | INTRAMUSCULAR | Status: DC | PRN

## 2017-09-25 MED ORDER — KETOROLAC TROMETHAMINE 30 MG/ML IJ SOLN
INTRAMUSCULAR | Status: DC | PRN
  Administered 2017-09-25: 30 mg

## 2017-09-25 MED ORDER — PROMETHAZINE HCL 25 MG/ML IJ SOLN: 6.2500 mg | INTRAMUSCULAR | Status: DC | PRN

## 2017-09-25 MED ORDER — MIDAZOLAM HCL 2 MG/2ML IJ SOLN
INTRAMUSCULAR | Status: AC
  Filled 2017-09-25: qty 2

## 2017-09-25 MED ORDER — PHENYLEPHRINE 40 MCG/ML (10ML) SYRINGE FOR IV PUSH (FOR BLOOD PRESSURE SUPPORT)
PREFILLED_SYRINGE | INTRAVENOUS | Status: AC
  Filled 2017-09-25: qty 20

## 2017-09-25 MED ORDER — METHOCARBAMOL 500 MG PO TABS
500.0000 mg | ORAL_TABLET | Freq: Four times a day (QID) | ORAL | Status: DC | PRN
  Administered 2017-09-25 – 2017-09-26 (×2): 500 mg via ORAL
  Filled 2017-09-25 (×2): qty 1

## 2017-09-25 MED ORDER — CELECOXIB 200 MG PO CAPS
200.0000 mg | ORAL_CAPSULE | Freq: Two times a day (BID) | ORAL | Status: DC
  Filled 2017-09-25: qty 1

## 2017-09-25 MED ORDER — MAGNESIUM CITRATE PO SOLN: 1.0000 | Freq: Once | ORAL | Status: DC | PRN

## 2017-09-25 MED ORDER — DOCUSATE SODIUM 100 MG PO CAPS: 100.0000 mg | ORAL_CAPSULE | Freq: Two times a day (BID) | ORAL | 0 refills | Status: DC

## 2017-09-25 MED ORDER — ACETAMINOPHEN 650 MG RE SUPP: 650.0000 mg | RECTAL | Status: DC | PRN

## 2017-09-25 MED ORDER — FENTANYL CITRATE (PF) 100 MCG/2ML IJ SOLN
INTRAMUSCULAR | Status: DC | PRN
  Administered 2017-09-25 (×2): 50 ug via INTRAVENOUS

## 2017-09-25 MED ORDER — KETOROLAC TROMETHAMINE 30 MG/ML IJ SOLN: 15.0000 mg | Freq: Once | INTRAMUSCULAR | Status: DC | PRN

## 2017-09-25 MED ORDER — PHENYLEPHRINE HCL 10 MG/ML IJ SOLN
INTRAMUSCULAR | Status: DC | PRN
  Administered 2017-09-25: 40 ug via INTRAVENOUS
  Administered 2017-09-25 (×9): 80 ug via INTRAVENOUS

## 2017-09-25 MED ORDER — CHLORHEXIDINE GLUCONATE 4 % EX LIQD: 60.0000 mL | Freq: Once | CUTANEOUS | Status: DC

## 2017-09-25 MED ORDER — METOCLOPRAMIDE HCL 5 MG/ML IJ SOLN: 5.0000 mg | Freq: Three times a day (TID) | INTRAMUSCULAR | Status: DC | PRN

## 2017-09-25 MED ORDER — EPHEDRINE SULFATE 50 MG/ML IJ SOLN
INTRAMUSCULAR | Status: DC | PRN
  Administered 2017-09-25 (×4): 5 mg via INTRAVENOUS
  Administered 2017-09-25: 10 mg via INTRAVENOUS

## 2017-09-25 MED ORDER — ONDANSETRON HCL 4 MG/2ML IJ SOLN
INTRAMUSCULAR | Status: AC
  Filled 2017-09-25: qty 2

## 2017-09-25 MED ORDER — FERROUS SULFATE 325 (65 FE) MG PO TABS
325.0000 mg | ORAL_TABLET | Freq: Three times a day (TID) | ORAL | Status: DC
  Administered 2017-09-25 – 2017-09-26 (×3): 325 mg via ORAL
  Filled 2017-09-25 (×3): qty 1

## 2017-09-25 MED ORDER — SODIUM CHLORIDE 0.9 % IV SOLN
INTRAVENOUS | Status: DC
  Administered 2017-09-25 – 2017-09-26 (×2): via INTRAVENOUS

## 2017-09-25 MED ORDER — BISACODYL 10 MG RE SUPP: 10.0000 mg | Freq: Every day | RECTAL | Status: DC | PRN

## 2017-09-25 MED ORDER — RINGERS IRRIGATION IR SOLN
Status: DC | PRN
  Administered 2017-09-25: 1000 mL

## 2017-09-25 MED ORDER — ENOXAPARIN SODIUM 40 MG/0.4ML ~~LOC~~ SOLN
40.0000 mg | SUBCUTANEOUS | Status: DC
  Administered 2017-09-26: 08:00:00 40 mg via SUBCUTANEOUS
  Filled 2017-09-25: qty 0.4

## 2017-09-25 MED ORDER — ENOXAPARIN SODIUM 40 MG/0.4ML ~~LOC~~ SOLN: 40.0000 mg | SUBCUTANEOUS | 0 refills | Status: DC

## 2017-09-25 MED ORDER — DOCUSATE SODIUM 100 MG PO CAPS
100.0000 mg | ORAL_CAPSULE | Freq: Two times a day (BID) | ORAL | Status: DC
  Administered 2017-09-25: 100 mg via ORAL
  Filled 2017-09-25: qty 1

## 2017-09-25 MED ORDER — HYDROCODONE-ACETAMINOPHEN 7.5-325 MG PO TABS: 2.0000 | ORAL_TABLET | ORAL | Status: DC | PRN

## 2017-09-25 MED ORDER — MENTHOL 3 MG MT LOZG: 1.0000 | LOZENGE | OROMUCOSAL | Status: DC | PRN

## 2017-09-25 MED ORDER — OMEPRAZOLE 20 MG PO CPDR: 20.0000 mg | DELAYED_RELEASE_CAPSULE | Freq: Every day | ORAL | Status: DC

## 2017-09-25 MED ORDER — ONDANSETRON HCL 4 MG/2ML IJ SOLN: 4.0000 mg | Freq: Four times a day (QID) | INTRAMUSCULAR | Status: DC | PRN

## 2017-09-25 MED ORDER — DEXAMETHASONE SODIUM PHOSPHATE 10 MG/ML IJ SOLN
INTRAMUSCULAR | Status: AC
  Filled 2017-09-25: qty 1

## 2017-09-25 MED ORDER — BUPIVACAINE HCL (PF) 0.25 % IJ SOLN
INTRAMUSCULAR | Status: AC
  Filled 2017-09-25: qty 30

## 2017-09-25 MED ORDER — BUPIVACAINE HCL (PF) 0.25 % IJ SOLN
INTRAMUSCULAR | Status: DC | PRN
  Administered 2017-09-25: 30 mL

## 2017-09-25 MED ORDER — PROPOFOL 10 MG/ML IV BOLUS
INTRAVENOUS | Status: AC
  Filled 2017-09-25: qty 40

## 2017-09-25 MED ORDER — LORATADINE 10 MG PO TABS
10.0000 mg | ORAL_TABLET | Freq: Every day | ORAL | Status: DC
  Administered 2017-09-25: 12:00:00 10 mg via ORAL
  Filled 2017-09-25: qty 1

## 2017-09-25 MED ORDER — PROPOFOL 10 MG/ML IV BOLUS
INTRAVENOUS | Status: AC
  Filled 2017-09-25: qty 20

## 2017-09-25 MED ORDER — HYDROCODONE-ACETAMINOPHEN 7.5-325 MG PO TABS
1.0000 | ORAL_TABLET | ORAL | Status: DC | PRN
Start: 2017-09-25 — End: 2017-09-26
  Administered 2017-09-25 – 2017-09-26 (×5): 1 via ORAL
  Filled 2017-09-25 (×5): qty 1

## 2017-09-25 MED ORDER — ROPIVACAINE HCL 7.5 MG/ML IJ SOLN
INTRAMUSCULAR | Status: DC | PRN
  Administered 2017-09-25: 20 mL via PERINEURAL

## 2017-09-25 MED ORDER — METHOCARBAMOL 1000 MG/10ML IJ SOLN
500.0000 mg | Freq: Four times a day (QID) | INTRAMUSCULAR | Status: DC | PRN
  Administered 2017-09-25: 500 mg via INTRAVENOUS
  Filled 2017-09-25: qty 550

## 2017-09-25 MED ORDER — POLYETHYLENE GLYCOL 3350 17 G PO PACK
17.0000 g | PACK | Freq: Two times a day (BID) | ORAL | Status: DC
  Administered 2017-09-25: 17 g via ORAL
  Filled 2017-09-25: qty 1

## 2017-09-25 MED ORDER — LACTATED RINGERS IV SOLN
INTRAVENOUS | Status: DC | PRN
Start: 2017-09-25 — End: 2017-09-25
  Administered 2017-09-25 (×2): via INTRAVENOUS

## 2017-09-25 MED ORDER — BUPIVACAINE IN DEXTROSE 0.75-8.25 % IT SOLN
INTRATHECAL | Status: DC | PRN
  Administered 2017-09-25: 1.8 mL via INTRATHECAL

## 2017-09-25 MED ORDER — DEXAMETHASONE SODIUM PHOSPHATE 10 MG/ML IJ SOLN
10.0000 mg | Freq: Once | INTRAMUSCULAR | Status: AC
  Administered 2017-09-25: 10 mg via INTRAVENOUS

## 2017-09-25 MED ORDER — HYDROCODONE-ACETAMINOPHEN 7.5-325 MG PO TABS: 1.0000 | ORAL_TABLET | ORAL | 0 refills | Status: DC | PRN

## 2017-09-25 MED ORDER — NON FORMULARY: 20.0000 mg | Freq: Every day | Status: DC

## 2017-09-25 MED ORDER — TRANEXAMIC ACID 1000 MG/10ML IV SOLN
1000.0000 mg | Freq: Once | INTRAVENOUS | Status: AC
  Administered 2017-09-25: 12:00:00 1000 mg via INTRAVENOUS
  Filled 2017-09-25: qty 1100

## 2017-09-25 MED ORDER — PHENOL 1.4 % MT LIQD: 1.0000 | OROMUCOSAL | Status: DC | PRN

## 2017-09-25 MED ORDER — MEPERIDINE HCL 50 MG/ML IJ SOLN: 6.2500 mg | INTRAMUSCULAR | Status: DC | PRN

## 2017-09-25 MED ORDER — ACETAMINOPHEN 325 MG PO TABS: 650.0000 mg | ORAL_TABLET | ORAL | Status: DC | PRN

## 2017-09-25 MED ORDER — PROPOFOL 500 MG/50ML IV EMUL
INTRAVENOUS | Status: DC | PRN
  Administered 2017-09-25: 40 ug/kg/min via INTRAVENOUS

## 2017-09-25 SURGICAL SUPPLY — 46 items
BAG DECANTER FOR FLEXI CONT (MISCELLANEOUS) IMPLANT
BAG ZIPLOCK 12X15 (MISCELLANEOUS) IMPLANT
BANDAGE ACE 6X5 VEL STRL LF (GAUZE/BANDAGES/DRESSINGS) ×3 IMPLANT
BLADE SAW SGTL 11.0X1.19X90.0M (BLADE) ×3 IMPLANT
BLADE SAW SGTL 13.0X1.19X90.0M (BLADE) ×3 IMPLANT
BOWL SMART MIX CTS (DISPOSABLE) ×3 IMPLANT
CAPT KNEE TOTAL 3 ATTUNE ×3 IMPLANT
CEMENT HV SMART SET (Cement) ×6 IMPLANT
COVER SURGICAL LIGHT HANDLE (MISCELLANEOUS) ×3 IMPLANT
CUFF TOURN SGL QUICK 34 (TOURNIQUET CUFF) ×2
CUFF TRNQT CYL 34X4X40X1 (TOURNIQUET CUFF) ×1 IMPLANT
DECANTER SPIKE VIAL GLASS SM (MISCELLANEOUS) ×3 IMPLANT
DERMABOND ADVANCED (GAUZE/BANDAGES/DRESSINGS) ×2
DERMABOND ADVANCED .7 DNX12 (GAUZE/BANDAGES/DRESSINGS) ×1 IMPLANT
DRAPE U-SHAPE 47X51 STRL (DRAPES) ×3 IMPLANT
DRESSING AQUACEL AG SP 3.5X10 (GAUZE/BANDAGES/DRESSINGS) ×1 IMPLANT
DRSG AQUACEL AG SP 3.5X10 (GAUZE/BANDAGES/DRESSINGS) ×3
DURAPREP 26ML APPLICATOR (WOUND CARE) ×6 IMPLANT
ELECT REM PT RETURN 15FT ADLT (MISCELLANEOUS) ×3 IMPLANT
GLOVE BIOGEL M 7.0 STRL (GLOVE) IMPLANT
GLOVE BIOGEL PI IND STRL 7.5 (GLOVE) ×1 IMPLANT
GLOVE BIOGEL PI IND STRL 8.5 (GLOVE) ×1 IMPLANT
GLOVE BIOGEL PI INDICATOR 7.5 (GLOVE) ×2
GLOVE BIOGEL PI INDICATOR 8.5 (GLOVE) ×2
GLOVE ECLIPSE 8.0 STRL XLNG CF (GLOVE) ×3 IMPLANT
GLOVE ORTHO TXT STRL SZ7.5 (GLOVE) ×6 IMPLANT
GOWN STRL REUS W/TWL LRG LVL3 (GOWN DISPOSABLE) ×3 IMPLANT
GOWN STRL REUS W/TWL XL LVL3 (GOWN DISPOSABLE) ×3 IMPLANT
HANDPIECE INTERPULSE COAX TIP (DISPOSABLE) ×2
MANIFOLD NEPTUNE II (INSTRUMENTS) ×3 IMPLANT
PACK TOTAL KNEE CUSTOM (KITS) ×3 IMPLANT
POSITIONER SURGICAL ARM (MISCELLANEOUS) ×3 IMPLANT
SET HNDPC FAN SPRY TIP SCT (DISPOSABLE) ×1 IMPLANT
SET PAD KNEE POSITIONER (MISCELLANEOUS) ×3 IMPLANT
SUT MNCRL AB 4-0 PS2 18 (SUTURE) ×3 IMPLANT
SUT STRATAFIX 1PDS 45CM VIOLET (SUTURE) IMPLANT
SUT STRATAFIX SPIRAL PDS+ 70CM (SUTURE) ×3
SUT VIC AB 1 CT1 36 (SUTURE) ×3 IMPLANT
SUT VIC AB 2-0 CT1 27 (SUTURE) ×6
SUT VIC AB 2-0 CT1 TAPERPNT 27 (SUTURE) ×3 IMPLANT
SUTURE STRATFX SPIRL PDS+ 70CM (SUTURE) ×1 IMPLANT
SYR 50ML LL SCALE MARK (SYRINGE) ×3 IMPLANT
TRAY FOLEY W/METER SILVER 16FR (SET/KITS/TRAYS/PACK) ×3 IMPLANT
WATER STERILE IRR 1000ML POUR (IV SOLUTION) ×3 IMPLANT
WRAP KNEE MAXI GEL POST OP (GAUZE/BANDAGES/DRESSINGS) ×3 IMPLANT
YANKAUER SUCT BULB TIP 10FT TU (MISCELLANEOUS) ×3 IMPLANT

## 2017-09-25 NOTE — Discharge Instructions (Addendum)

## 2017-09-25 NOTE — Anesthesia Procedure Notes (Signed)
Spinal  Patient location during procedure: OR Staffing Anesthesiologist: Nolon Nations, MD Performed: anesthesiologist  Preanesthetic Checklist Completed: patient identified, site marked, surgical consent, pre-op evaluation, timeout performed, IV checked, risks and benefits discussed and monitors and equipment checked Spinal Block Patient position: sitting Prep: DuraPrep Patient monitoring: heart rate, continuous pulse ox and blood pressure Approach: right paramedian Location: L3-4 Injection technique: single-shot Needle Needle type: Sprotte  Needle gauge: 24 G Needle length: 9 cm Additional Notes Expiration date of kit checked and confirmed. Patient tolerated procedure well, without complications.

## 2017-09-25 NOTE — Evaluation (Signed)
Physical Therapy Evaluation Patient Details Name: [name] [name] MRN: [id number] DOB: [birth year] Today's Date: 09/25/2017   History of Present Illness  Pt is a 72 yo female s/p L TKA.  Clinical Impression  Pt is s/p L TKA resulting in the deficits listed below (See PT problem list). PT will benefit from skilled PT to increase their independence and safety with mobility to allow discharge. Pt reports she has been doing ankle pumps and is ready to get up and move. Ambulated with RW, slow but steady gait, no increase in pain. Pt states she will be returning home with spouse/family assist and has outpatient PT scheduled for Friday (09/29/17).     Follow Up Recommendations DC plan and follow up therapy as arranged by surgeon;Outpatient PT    Equipment Recommendations  None recommended by PT    Recommendations for Other Services       Precautions / Restrictions Precautions Precautions: Fall;Knee Restrictions Other Position/Activity Restrictions: WBAT      Mobility  Bed Mobility Overal bed mobility: Needs Assistance Bed Mobility: Supine to Sit     Supine to sit: Min guard     General bed mobility comments: cues for safe technique  Transfers Overall transfer level: Needs assistance Equipment used: Rolling walker (2 wheeled) Transfers: Sit to/from Stand Sit to Stand: Min assist         General transfer comment: verbal cues for UE/LE placement, sequence, RW positioning; assist for rise  Ambulation/Gait Ambulation/Gait assistance: Min guard Ambulation Distance (Feet): 40 Feet Assistive device: Rolling walker (2 wheeled) Gait Pattern/deviations: Step-to pattern;Decreased stance time - left;Antalgic Gait velocity: decreased   General Gait Details: verbal cues for sequence, RW positioning, step length  Stairs            Wheelchair Mobility    Modified Rankin (Stroke Patients Only)       Balance                                              Pertinent Vitals/Pain Pain Assessment: 0-10 Pain Score: 5  Pain Location: L knee Pain Descriptors / Indicators: Aching;Sore Pain Intervention(s): Limited activity within patient's tolerance;Repositioned;Monitored during session;Premedicated before session    E. Lopez expects to be discharged to:: Private residence Living Arrangements: Spouse/significant other Available Help at Discharge: Family Type of Home: House Home Access: Stairs to enter   Technical brewer of Steps: 1 Home Layout: One level Home Equipment: Environmental consultant - 2 wheels;Toilet riser;Shower seat      Prior Function Level of Independence: Independent               Hand Dominance        Extremity/Trunk Assessment   Upper Extremity Assessment Upper Extremity Assessment: Overall WFL for tasks assessed    Lower Extremity Assessment Lower Extremity Assessment: LLE deficits/detail LLE Deficits / Details: able to perform SLR, observed at least 75* AROM with sitting EOB LLE: Unable to fully assess due to pain    Cervical / Trunk Assessment Cervical / Trunk Assessment: Normal  Communication   Communication: No difficulties  Cognition Arousal/Alertness: Awake/alert Behavior During Therapy: WFL for tasks assessed/performed Overall Cognitive Status: Within Functional Limits for tasks assessed  General Comments      Exercises Total Joint Exercises Ankle Circles/Pumps: AROM;20 reps;Both   Assessment/Plan    PT Assessment Patient needs continued PT services  PT Problem List Decreased strength;Decreased mobility;Decreased range of motion;Pain;Decreased knowledge of precautions       PT Treatment Interventions DME instruction;Gait training;Therapeutic activities;Therapeutic exercise;Patient/family education;Stair training;Functional mobility training    PT Goals (Current goals can be found in the Care Plan section)  Acute  Rehab PT Goals Patient Stated Goal: to return home PT Goal Formulation: With patient Time For Goal Achievement: 10/02/17 Potential to Achieve Goals: Good    Frequency 7X/week   Barriers to discharge        Co-evaluation               AM-PAC PT "6 Clicks" Daily Activity  Outcome Measure Difficulty turning over in bed (including adjusting bedclothes, sheets and blankets)?: A Little Difficulty moving from lying on back to sitting on the side of the bed? : A Little Difficulty sitting down on and standing up from a chair with arms (e.g., wheelchair, bedside commode, etc,.)?: Unable Help needed moving to and from a bed to chair (including a wheelchair)?: A Little Help needed walking in hospital room?: A Little Help needed climbing 3-5 steps with a railing? : A Lot 6 Click Score: 15    End of Session Equipment Utilized During Treatment: Gait belt Activity Tolerance: Patient tolerated treatment well Patient left: in chair;with call bell/phone within reach;with chair alarm set Nurse Communication: Mobility status PT Visit Diagnosis: Difficulty in walking, not elsewhere classified (R26.2)    Time: 6195-0932 PT Time Calculation (min) (ACUTE ONLY): 18 min   Charges:   PT Evaluation $PT Eval Low Complexity: 1 Low     PT G Codes:        Martinique Natalya Domzalski, SPT   Martinique Denora Wysocki 09/25/2017, 5:38 PM

## 2017-09-25 NOTE — Op Note (Signed)
NAME:  [name] [name]                      MEDICAL RECORD NO.:  [id number]                             FACILITY:  [facility]      PHYSICIAN:  Pietro Cassis. Alvan Dame, M.D.  DATE OF BIRTH:  [birth year]      DATE OF PROCEDURE:  09/25/2017                                     OPERATIVE REPORT         PREOPERATIVE DIAGNOSIS:  Left knee osteoarthritis.      POSTOPERATIVE DIAGNOSIS:  Left knee osteoarthritis.      FINDINGS:  The patient was noted to have complete loss of cartilage and   bone-on-bone arthritis with associated osteophytes in all three compartments of   the knee with a significant synovitis and associated effusion.      PROCEDURE:  Left total knee replacement.      COMPONENTS USED:  DePuy Attune rotating platform posterior stabilized knee   system, a size 5N femur, 5 tibia, size 8 mm PS AOX insert, and 38 anatomic patellar   button.      SURGEON:  Pietro Cassis. Alvan Dame, M.D.      ASSISTANT:  Danae Orleans, PA-C.      ANESTHESIA:  Regional and Spinal.      SPECIMENS:  None.      COMPLICATION:  None.      DRAINS:  None.  EBL: <100cc      TOURNIQUET TIME:   Total Tourniquet Time Documented: Thigh (Left) - 32 minutes Total: Thigh (Left) - 32 minutes  .      The patient was stable to the recovery room.      INDICATION FOR PROCEDURE:  [name] [name] is a 72 y.o. female patient of   mine.  The patient had been seen, evaluated, and treated conservatively in the   office with medication, activity modification, and injections.  The patient had   radiographic changes of bone-on-bone arthritis with endplate sclerosis and osteophytes noted.      The patient failed conservative measures including medication, injections, and activity modification, and at this point was ready for more definitive measures.   Based on the radiographic changes and failed conservative measures, the patient   decided to proceed with total knee replacement.  Risks of infection,   DVT, component failure, need for  revision surgery, postop course, and   expectations were all   discussed and reviewed.  Consent was obtained for benefit of pain   relief.      PROCEDURE IN DETAIL:  The patient was brought to the operative theater.   Once adequate anesthesia, preoperative antibiotics, 2 gm of ancef, 1 gm of Tranexamic Acid, and 10 mg of Decadron administered, the patient was positioned supine with the left thigh tourniquet placed.  The  left lower extremity was prepped and draped in sterile fashion.  A time-   out was performed identifying the patient, planned procedure, and   extremity.      The left lower extremity was placed in the [facility] leg holder.  The leg was   exsanguinated, tourniquet elevated to 250 mmHg.  A midline incision was   made  followed by median parapatellar arthrotomy.  Following initial   exposure, attention was first directed to the patella.  Precut   measurement was noted to be 24 mm.  I resected down to 14 mm and used a   38 anatomic patellar button to restore patellar height as well as cover the cut   surface.      The lug holes were drilled and a metal shim was placed to protect the   patella from retractors and saw blades.      At this point, attention was now directed to the femur.  The femoral   canal was opened with a drill, irrigated to try to prevent fat emboli.  An   intramedullary rod was passed at 3 degrees valgus, 8 mm of bone was   resected off the distal femur.  Following this resection, the tibia was   subluxated anteriorly.  Using the extramedullary guide, 2 mm of bone was resected off   the proximal lateral tibia.  We confirmed the gap would be   stable medially and laterally with a 10 mm insert as well as confirmed   the cut was perpendicular in the coronal plane, checking with an alignment rod.      Once this was done, I sized the femur to be a size 5 in the anterior-   posterior dimension, chose a narrow component based on medial and   lateral dimension.   The size 5 rotation block was then pinned in   position anterior referenced using the C-clamp to set rotation.  The   anterior, posterior, and  chamfer cuts were made without difficulty nor   notching making certain that I was along the anterior cortex to help   with flexion gap stability.      The final box cut was made off the lateral aspect of distal femur.      At this point, the tibia was sized to be a size 5, the size 5 tray was   then pinned in position through the medial third of the tubercle,   drilled, and keel punched.  Trial reduction was now carried with a 5 femur,  5 tibia, a size 7 then 8 mm PS insert, and the 38 anatomic patella botton.  The knee was brought to   extension, full extension with good flexion stability with the patella   tracking through the trochlea without application of pressure.  Given   all these findings the femoral lug holes were drilled and then the trial components removed.  Final components were   opened and cement was mixed.  The knee was irrigated with normal saline   solution and pulse lavage.  The synovial lining was   then injected with 30 cc of 0.25% Marcaine with epinephrine and 1 cc of Toradol plus 30 cc of NS for a total of 61 cc.      The knee was irrigated.  Final implants were then cemented onto clean and   dried cut surfaces of bone with the knee brought to extension with a size 8 mm PS trial insert.      Once the cement had fully cured, the excess cement was removed   throughout the knee.  I confirmed I was satisfied with the range of   motion and stability, and the final size 8 mm PS AOX insert was chosen.  It was   placed into the knee.      The tourniquet had been let down at  32 minutes.  No significant   hemostasis required.  The   extensor mechanism was then reapproximated using #1 Vicryl and #1 Stratafix sutures with the knee   in flexion.  The   remaining wound was closed with 2-0 Vicryl and running 4-0 Monocryl.   The knee  was cleaned, dried, dressed sterilely using Dermabond and   Aquacel dressing.  The patient was then   brought to recovery room in stable condition, tolerating the procedure   well.   Please note that Physician Assistant, Danae Orleans, PA-C, was present for the entirety of the case, and was utilized for pre-operative positioning, peri-operative retractor management, general facilitation of the procedure.  He was also utilized for primary wound closure at the end of the case.              Pietro Cassis Alvan Dame, M.D.    09/25/2017 9:06 AM

## 2017-09-25 NOTE — Transfer of Care (Signed)
Immediate Anesthesia Transfer of Care Note  Patient: [name] [name]  Procedure(s) Performed: LEFT TOTAL KNEE ARTHROPLASTY (Left Knee)  Patient Location: PACU  Anesthesia Type:Spinal and MAC combined with regional for post-op pain  Level of Consciousness: awake, alert , oriented and patient cooperative  Airway & Oxygen Therapy: Patient Spontanous Breathing and Patient connected to face mask oxygen  Post-op Assessment: Report given to RN and Post -op Vital signs reviewed and stable  Post vital signs: Reviewed and stable  Last Vitals:  Vitals:   09/25/17 0544  BP: 132/69  Pulse: 87  Resp: 16  Temp: 36.5 C  SpO2: 98%    Last Pain:  Vitals:   09/25/17 0544  TempSrc: Oral      Patients Stated Pain Goal: 4 (85/02/77 4128)  Complications: No apparent anesthesia complications

## 2017-09-25 NOTE — Interval H&P Note (Signed)
History and Physical Interval Note:  09/25/2017 7:32 AM  [name] [name]  has presented today for surgery, with the diagnosis of LEFTknee osteoarthritis  The various methods of treatment have been discussed with the patient and family. After consideration of risks, benefits and other options for treatment, the patient has consented to  Procedure(s) with comments: LEFT TOTAL KNEE ARTHROPLASTY (Left) - 90 mins as a surgical intervention .  The patient's history has been reviewed, patient examined, no change in status, stable for surgery.  I have reviewed the patient's chart and labs.  Questions were answered to the patient's satisfaction.     [name]

## 2017-09-25 NOTE — Anesthesia Procedure Notes (Signed)
Procedure Name: MAC Date/Time: 09/25/2017 7:40 AM Performed by: West Pugh, CRNA Pre-anesthesia Checklist: Patient identified, Emergency Drugs available, Suction available, Patient being monitored and Timeout performed Patient Re-evaluated:Patient Re-evaluated prior to induction Oxygen Delivery Method: Nasal cannula and Simple face mask Placement Confirmation: CO2 detector and positive ETCO2 Dental Injury: Teeth and Oropharynx as per pre-operative assessment

## 2017-09-25 NOTE — Progress Notes (Signed)
ANTICOAGULATION CONSULT NOTE - Initial Consult  Pharmacy Consult for warfarin Indication: atrial fibrillation  Allergies  Allergen Reactions  . Adhesive [Tape] Other (See Comments)    Tears skin    Patient Measurements: Height: 5\' 4"  (162.6 cm) Weight: 217 lb (98.4 kg) IBW/kg (Calculated) : 54.7  Vital Signs: Temp: 97.5 F (36.4 C) (01/14 1049) Temp Source: Oral (01/14 0544) BP: 125/59 (01/14 1049) Pulse Rate: 61 (01/14 1049)  Labs: Recent Labs    09/25/17 0630  LABPROT 14.3  INR 1.11    Estimated Creatinine Clearance: 40.8 mL/min (A) (by C-G formula based on SCr of 1.4 mg/dL (H)).   Medical History: Past Medical History:  Diagnosis Date  . AF (atrial fibrillation) ([place])   . Arthritis   . Cholelithiasis   . Colon polyps   . Diverticulosis   . DJD (degenerative joint disease)   . Dyslipidemia   . Fatty liver disease, nonalcoholic   . GERD (gastroesophageal reflux disease)   . Gout   . Hepatitis A AGE 72  . Hypertension   . Hypothyroidism   . Obesity     Medications:  Facility-Administered Medications Prior to Admission  Medication Dose Route Frequency Provider Last Rate Last Dose  . 0.9 %  sodium chloride infusion  500 mL Intravenous Continuous Irene Shipper, MD       Medications Prior to Admission  Medication Sig Dispense Refill Last Dose  . allopurinol (ZYLOPRIM) 300 MG tablet Take 300 mg by mouth daily.    09/25/2017 at 0400  . COENZYME Q10 PO Take 1 capsule by mouth daily.    Past Week at Unknown time  . fexofenadine (ALLEGRA) 180 MG tablet Take 180 mg by mouth every evening.    09/24/2017 at Unknown time  . furosemide (LASIX) 20 MG tablet Take 20 mg by mouth daily.    09/24/2017 at Unknown time  . levothyroxine (SYNTHROID, LEVOTHROID) 100 MCG tablet Take 100 mcg by mouth daily before breakfast.   09/25/2017 at 0400  . lisinopril-hydrochlorothiazide (PRINZIDE,ZESTORETIC) 20-12.5 MG per tablet Take 1 tablet by mouth daily.    09/24/2017 at Unknown time   . methocarbamol (ROBAXIN) 500 MG tablet Take 500 mg by mouth 2 (two) times daily.     09/24/2017 at Unknown time  . omeprazole (PRILOSEC) 20 MG capsule Take 20 mg by mouth daily.    09/25/2017 at 0400  . OVER THE COUNTER MEDICATION Apply 1 application topically 5 (five) times daily as needed (for rash). Greer's Goo Compound Cream   Past Month at Unknown time  . OVER THE COUNTER MEDICATION Take 1 tablet by mouth daily. [place] Study Drug Supplement and Multivitamin   Past Month at Unknown time  . pravastatin (PRAVACHOL) 40 MG tablet Take 40 mg by mouth at bedtime.    09/24/2017 at Unknown time  . traMADol (ULTRAM) 50 MG tablet Take 50 mg by mouth every 6 (six) hours as needed for moderate pain.    09/24/2017 at Unknown time  . warfarin (COUMADIN) 5 MG tablet Take 2.5-5 mg by mouth See admin instructions. Take 5 mg by mouth daily on Monday and Friday. Take 2.5 mg by mouth daily on all other days   09/20/2017   Scheduled:  . [START ON 09/26/2017] allopurinol  300 mg Oral Daily  . celecoxib  200 mg Oral Q12H  . [START ON 09/26/2017] dexamethasone  10 mg Intravenous Once  . docusate sodium  100 mg Oral BID  . [START ON 09/26/2017] enoxaparin (LOVENOX) injection  40 mg Subcutaneous Q24H  . ferrous sulfate  325 mg Oral TID PC  . furosemide  20 mg Oral Daily  . [START ON 09/26/2017] levothyroxine  100 mcg Oral QAC breakfast  . loratadine  10 mg Oral Daily  . polyethylene glycol  17 g Oral BID  . pravastatin  40 mg Oral QHS   PRN: acetaminophen **OR** acetaminophen, alum & mag hydroxide-simeth, bisacodyl, diphenhydrAMINE, HYDROcodone-acetaminophen, HYDROcodone-acetaminophen, HYDROmorphone (DILAUDID) injection, magnesium citrate, menthol-cetylpyridinium **OR** phenol, methocarbamol **OR** methocarbamol (ROBAXIN)  IV, metoCLOPramide **OR** metoCLOPramide (REGLAN) injection, ondansetron **OR** ondansetron (ZOFRAN) IV  Assessment: 72 yoF with PMH AFib on warfarin, today is POD74 s/p L TKA. Pharmacy to resume  warfarin while admitted; "bridging" per MD with prophylactic dose Lovenox to start POD1   Baseline INR subtherapeutic  Prior anticoagulation: warfarin 2.5 mg daily except for 5 mg Mon and Fri  Significant events:  Today, 09/25/2017:  CBC: not done yet postop, previously WNL on 1/8  INR subtherapeutic  Major drug interactions: none  No bleeding issues per nursing  CLD ordered  Goal of Therapy: INR 2-3  Plan:  Warfarin 5 mg PO tonight at 18:00  Daily INR  CBC at least q72 hr while on warfarin  Monitor for signs of bleeding or thrombosis   Reuel Boom, PharmD Pager: [phone] 09/25/2017, 11:21 AM

## 2017-09-25 NOTE — Anesthesia Procedure Notes (Signed)
Anesthesia Regional Block: Adductor canal block   Pre-Anesthetic Checklist: ,, timeout performed, Correct Patient, Correct Site, Correct Laterality, Correct Procedure, Correct Position, site marked, Risks and benefits discussed,  Surgical consent,  Pre-op evaluation,  At surgeon's request and post-op pain management  Laterality: Left  Prep: chloraprep       Needles:  Injection technique: Single-shot  Needle Type: Stimiplex     Needle Length: 9cm  Needle Gauge: 21     Additional Needles:   Procedures:,,,, ultrasound used (permanent image in chart),,,,  Narrative:  Start time: 09/25/2017 7:14 AM End time: 09/25/2017 7:16 AM Injection made incrementally with aspirations every 5 mL.  Performed by: Personally  Anesthesiologist: Nolon Nations, MD  Additional Notes: BP cuff, EKG monitors applied. Sedation begun. Artery and nerve location verified with U/S and anesthetic injected incrementally, slowly, and after negative aspirations under direct u/s guidance. Good fascial /perineural spread. Tolerated well.

## 2017-09-25 NOTE — Anesthesia Postprocedure Evaluation (Signed)
Anesthesia Post Note  Patient: [name] [name]  Procedure(s) Performed: LEFT TOTAL KNEE ARTHROPLASTY (Left Knee)     Patient location during evaluation: PACU Anesthesia Type: Spinal Level of consciousness: awake and alert Pain management: pain level controlled Vital Signs Assessment: post-procedure vital signs reviewed and stable Respiratory status: spontaneous breathing Cardiovascular status: stable Postop Assessment: spinal receding Anesthetic complications: no    Last Vitals:  Vitals:   09/25/17 1049 09/25/17 1200  BP: (!) 125/59 129/73  Pulse: 61 80  Resp: 14 15  Temp: (!) 36.4 C (!) 36.4 C  SpO2: 100% 100%    Last Pain:  Vitals:   09/25/17 1200  TempSrc: Axillary                 Nolon Nations

## 2017-09-26 ENCOUNTER — Encounter (HOSPITAL_COMMUNITY): Payer: Self-pay | Admitting: Orthopedic Surgery

## 2017-09-26 DIAGNOSIS — M25762 Osteophyte, left knee: Secondary | ICD-10-CM | POA: Diagnosis not present

## 2017-09-26 DIAGNOSIS — M65862 Other synovitis and tenosynovitis, left lower leg: Secondary | ICD-10-CM | POA: Diagnosis not present

## 2017-09-26 DIAGNOSIS — E7800 Pure hypercholesterolemia, unspecified: Secondary | ICD-10-CM | POA: Diagnosis not present

## 2017-09-26 DIAGNOSIS — M25462 Effusion, left knee: Secondary | ICD-10-CM | POA: Diagnosis not present

## 2017-09-26 DIAGNOSIS — M109 Gout, unspecified: Secondary | ICD-10-CM | POA: Diagnosis not present

## 2017-09-26 DIAGNOSIS — K219 Gastro-esophageal reflux disease without esophagitis: Secondary | ICD-10-CM | POA: Diagnosis not present

## 2017-09-26 DIAGNOSIS — M1712 Unilateral primary osteoarthritis, left knee: Secondary | ICD-10-CM | POA: Diagnosis not present

## 2017-09-26 DIAGNOSIS — I4891 Unspecified atrial fibrillation: Secondary | ICD-10-CM | POA: Diagnosis not present

## 2017-09-26 DIAGNOSIS — K760 Fatty (change of) liver, not elsewhere classified: Secondary | ICD-10-CM | POA: Diagnosis not present

## 2017-09-26 LAB — BASIC METABOLIC PANEL
ANION GAP: 10 (ref 5–15)
BUN: 58 mg/dL — ABNORMAL HIGH (ref 6–20)
CALCIUM: 8.7 mg/dL — AB (ref 8.9–10.3)
CO2: 24 mmol/L (ref 22–32)
CREATININE: 1.7 mg/dL — AB (ref 0.44–1.00)
Chloride: 103 mmol/L (ref 101–111)
GFR, EST AFRICAN AMERICAN: 33 mL/min — AB (ref >60)
GFR, EST NON AFRICAN AMERICAN: 29 mL/min — AB (ref >60)
Glucose, Bld: 133 mg/dL — ABNORMAL HIGH (ref 65–99)
Potassium: 4.8 mmol/L (ref 3.5–5.1)
SODIUM: 137 mmol/L (ref 135–145)

## 2017-09-26 LAB — CBC
HCT: 30.9 % — ABNORMAL LOW (ref 36.0–46.0)
Hemoglobin: 10.4 g/dL — ABNORMAL LOW (ref 12.0–15.0)
MCH: 31.7 pg (ref 26.0–34.0)
MCHC: 33.7 g/dL (ref 30.0–36.0)
MCV: 94.2 fL (ref 78.0–100.0)
PLATELETS: 166 10*3/uL (ref 150–400)
RBC: 3.28 MIL/uL — ABNORMAL LOW (ref 3.87–5.11)
RDW: 14.1 % (ref 11.5–15.5)
WBC: 19.7 10*3/uL — AB (ref 4.0–10.5)

## 2017-09-26 LAB — PROTIME-INR
INR: 1.14
Prothrombin Time: 14.5 seconds (ref 11.4–15.2)

## 2017-09-26 NOTE — Progress Notes (Signed)
Patient ID: [name] [name], female   DOB: [birth year], 72 y.o.   MRN: [id number] Subjective: 1 Day Post-Op Procedure(s) (LRB): LEFT TOTAL KNEE ARTHROPLASTY (Left)    Patient reports pain as mild to moderate but overall doing well.  No events.  Walked with therapy yesterday  Objective:   VITALS:   Vitals:   09/26/17 0200 09/26/17 0500  BP: 122/61 118/63  Pulse: 69 68  Resp: 16 16  Temp: 98 F (36.7 C) 98 F (36.7 C)  SpO2: 95% 96%    Neurovascular intact Incision: dressing C/D/I  LABS Recent Labs    09/25/17 1129 09/26/17 0611  HGB 11.1* 10.4*  HCT 36.0 30.9*  WBC 10.5 19.7*  PLT 191 166    Recent Labs    09/25/17 1129 09/26/17 0611  NA  --  137  K  --  4.8  BUN  --  58*  CREATININE 1.81* 1.70*  GLUCOSE  --  133*    Recent Labs    09/25/17 0630 09/26/17 0611  INR 1.11 1.14     Assessment/Plan: 1 Day Post-Op Procedure(s) (LRB): LEFT TOTAL KNEE ARTHROPLASTY (Left)   Advance diet Up with therapy  Home today after am therapy Reviewed goals RTC in 2 weeks outpt PT already set up

## 2017-09-26 NOTE — Plan of Care (Signed)
Care plan reviewed and discussed with patient. 

## 2017-09-26 NOTE — Progress Notes (Signed)
Physical Therapy Treatment Patient Details Name: [name] [name] MRN: [id number] DOB: 12-18-1945 Today's Date: 09/26/2017    History of Present Illness Pt is a 72 yo female s/p L TKA.    PT Comments    POD # 1 am session Assisted OOB to amb a greater distance. Performed all supine TKR TE's following HEP handout. Instructed on proper tech, freq as well as use of ICE. Pt ready for D/C to home.    Follow Up Recommendations  DC plan and follow up therapy as arranged by surgeon;Outpatient PT     Equipment Recommendations  None recommended by PT    Recommendations for Other Services       Precautions / Restrictions Precautions Precautions: Fall;Knee Restrictions Weight Bearing Restrictions: No Other Position/Activity Restrictions: WBAT    Mobility  Bed Mobility Overal bed mobility: Needs Assistance Bed Mobility: Supine to Sit     Supine to sit: Supervision     General bed mobility comments: able to self perform with increased time  Transfers Overall transfer level: Needs assistance Equipment used: Rolling walker (2 wheeled) Transfers: Sit to/from Stand Sit to Stand: Supervision         General transfer comment: good safety cognition and use of hands  Ambulation/Gait Ambulation/Gait assistance: Supervision Ambulation Distance (Feet): 55 Feet Assistive device: Rolling walker (2 wheeled) Gait Pattern/deviations: Step-to pattern;Decreased stance time - left;Antalgic Gait velocity: decreased   General Gait Details: <25% VC's on safety with turns and backward gait   Stairs Stairs: (no stairs to enter home)          Wheelchair Mobility    Modified Rankin (Stroke Patients Only)       Balance                                            Cognition Arousal/Alertness: Awake/alert Behavior During Therapy: WFL for tasks assessed/performed Overall Cognitive Status: Within Functional Limits for tasks assessed                                         Exercises      General Comments        Pertinent Vitals/Pain Pain Assessment: No/denies pain Pain Score: 4  Pain Location: L knee Pain Descriptors / Indicators: Aching;Sore;Operative site guarding Pain Intervention(s): Monitored during session;Repositioned;Premedicated before session;Ice applied    Home Living Family/patient expects to be discharged to:: Private residence Living Arrangements: Spouse/significant other Available Help at Discharge: Family         Home Equipment: Shower seat;Grab bars - tub/shower      Prior Function Level of Independence: Independent      Comments: Pt assists her sister. Husband can help pt a little.  She has a caregiver for sister right now   PT Goals (current goals can now be found in the care plan section) Progress towards PT goals: Progressing toward goals    Frequency    7X/week      PT Plan Current plan remains appropriate    Co-evaluation              AM-PAC PT "6 Clicks" Daily Activity  Outcome Measure  Difficulty turning over in bed (including adjusting bedclothes, sheets and blankets)?: A Little Difficulty moving from lying on back to sitting on the side  of the bed? : A Little Difficulty sitting down on and standing up from a chair with arms (e.g., wheelchair, bedside commode, etc,.)?: A Little Help needed moving to and from a bed to chair (including a wheelchair)?: A Little Help needed walking in hospital room?: A Little Help needed climbing 3-5 steps with a railing? : A Little 6 Click Score: 18    End of Session Equipment Utilized During Treatment: Gait belt Activity Tolerance: Patient tolerated treatment well Patient left: in chair;with call bell/phone within reach;with chair alarm set Nurse Communication: (pt ready for D/C to home) PT Visit Diagnosis: Difficulty in walking, not elsewhere classified (R26.2)     Time: 7673-4193 PT Time Calculation (min) (ACUTE ONLY): 26  min  Charges:  $Gait Training: 8-22 mins $Therapeutic Exercise: 8-22 mins                    G Codes:       Rica Koyanagi  PTA [facility]  Acute  Rehab Pager      [phone]

## 2017-09-26 NOTE — Discharge Summary (Signed)
Physician Discharge Summary  Patient ID: [name] [name] MRN: [id number] DOB/AGE: 74-Mar-1947 74 y.o.  Admit date: 09/25/2017 Discharge date: 09/26/2017   Procedures:  Procedure(s) (LRB): LEFT TOTAL KNEE ARTHROPLASTY (Left)  Attending Physician:  Dr. Paralee Cancel   Admission Diagnoses:   Left knee primary OA / pain  Discharge Diagnoses:  Principal Problem:   S/P left TKA Active Problems:   S/P total knee replacement  Past Medical History:  Diagnosis Date  . AF (atrial fibrillation) ([place])   . Arthritis   . Cholelithiasis   . Colon polyps   . Diverticulosis   . DJD (degenerative joint disease)   . Dyslipidemia   . Fatty liver disease, nonalcoholic   . GERD (gastroesophageal reflux disease)   . Gout   . Hepatitis A AGE 35  . Hypertension   . Hypothyroidism   . Obesity     HPI:    [name] [name], 72 y.o. female, has a history of pain and functional disability in the left knee due to arthritis and has failed non-surgical conservative treatments for greater than 12 weeks to includeNSAID's and/or analgesics, corticosteriod injections and activity modification.  Onset of symptoms was gradual, starting 2+ years ago with gradually worsening course since that time. The patient noted prior procedures on the knee to include  arthroscopy on the left knee(s).  Patient currently rates pain in the left knee(s) at 10 out of 10 with activity. Patient has night pain, worsening of pain with activity and weight bearing, pain that interferes with activities of daily living, pain with passive range of motion, crepitus and joint swelling.  Patient has evidence of periarticular osteophytes and joint space narrowing by imaging studies.  There is no active infection.  Risks, benefits and expectations were discussed with the patient.  Risks including but not limited to the risk of anesthesia, blood clots, nerve damage, blood vessel damage, failure of the prosthesis, infection and up to and including death.   Patient understand the risks, benefits and expectations and wishes to proceed with surgery.   PCP: [name], MD   Discharged Condition: good  Hospital Course:  Patient underwent the above stated procedure on 09/25/2017. Patient tolerated the procedure well and brought to the recovery room in good condition and subsequently to the floor.  POD #1 BP: 118/63 ; Pulse: 68 ; Temp: 98 F (36.7 C) ; Resp: 16 Patient reports pain as mild to moderate but overall doing well.  No events.  Walked with therapy yesterday Neurovascular intact and incision: dressing C/D/I.   LABS  Basename    HGB     10.4  HCT     30.9    Discharge Exam: General appearance: alert, cooperative and no distress Extremities: Homans sign is negative, no sign of DVT, no edema, redness or tenderness in the calves or thighs and no ulcers, gangrene or trophic changes  Disposition: Home with follow up in 2 weeks   Follow-up Information    Paralee Cancel, MD. Schedule an appointment as soon as possible for a visit in 2 week(s).   Specialty:  Orthopedic Surgery Contact information: [address] [place] [address] [phone]           Discharge Instructions    Call MD / Call 911   Complete by:  As directed    If you experience chest pain or shortness of breath, CALL 911 and be transported to the hospital emergency room.  If you develope a fever above  15 F, pus (white drainage) or increased drainage or redness at the wound, or calf pain, call your surgeon's office.   Change dressing   Complete by:  As directed    Maintain surgical dressing until follow up in the clinic. If the edges start to pull up, may reinforce with tape. If the dressing is no longer working, may remove and cover with gauze and tape, but must keep the area dry and clean.  Call with any questions or concerns.   Constipation Prevention   Complete by:  As directed    Drink plenty of fluids.  Prune juice may be  helpful.  You may use a stool softener, such as Colace (over the counter) 100 mg twice a day.  Use MiraLax (over the counter) for constipation as needed.   Diet - low sodium heart healthy   Complete by:  As directed    Discharge instructions   Complete by:  As directed    Maintain surgical dressing until follow up in the clinic. If the edges start to pull up, may reinforce with tape. If the dressing is no longer working, may remove and cover with gauze and tape, but must keep the area dry and clean.  Follow up in 2 weeks at [facility]. Call with any questions or concerns.   Increase activity slowly as tolerated   Complete by:  As directed    Weight bearing as tolerated with assist device (walker, cane, etc) as directed, use it as long as suggested by your surgeon or therapist, typically at least 4-6 weeks.   TED hose   Complete by:  As directed    Use stockings (TED hose) for 2 weeks on both leg(s).  You may remove them at night for sleeping.      Allergies as of 09/26/2017      Reactions   Adhesive [tape] Other (See Comments)   Tears skin      Medication List    STOP taking these medications   OVER THE COUNTER MEDICATION   OVER THE COUNTER MEDICATION   traMADol 50 MG tablet Commonly known as:  ULTRAM     TAKE these medications   allopurinol 300 MG tablet Commonly known as:  ZYLOPRIM Take 300 mg by mouth daily.   COENZYME Q10 PO Take 1 capsule by mouth daily.   docusate sodium 100 MG capsule Commonly known as:  COLACE Take 1 capsule (100 mg total) by mouth 2 (two) times daily.   enoxaparin 40 MG/0.4ML injection Commonly known as:  LOVENOX Inject 0.4 mLs (40 mg total) into the skin daily.   ferrous sulfate 325 (65 FE) MG tablet Commonly known as:  FERROUSUL Take 1 tablet (325 mg total) by mouth 3 (three) times daily with meals.   fexofenadine 180 MG tablet Commonly known as:  ALLEGRA Take 180 mg by mouth every evening.   furosemide 20 MG  tablet Commonly known as:  LASIX Take 20 mg by mouth daily.   HYDROcodone-acetaminophen 7.5-325 MG tablet Commonly known as:  NORCO Take 1-2 tablets by mouth every 4 (four) hours as needed for moderate pain or severe pain.   levothyroxine 100 MCG tablet Commonly known as:  SYNTHROID, LEVOTHROID Take 100 mcg by mouth daily before breakfast.   lisinopril-hydrochlorothiazide 20-12.5 MG tablet Commonly known as:  PRINZIDE,ZESTORETIC Take 1 tablet by mouth daily.   methocarbamol 500 MG tablet Commonly known as:  ROBAXIN Take 1 tablet (500 mg total) by mouth every 6 (six) hours as needed  for muscle spasms. What changed:    when to take this  reasons to take this   omeprazole 20 MG capsule Commonly known as:  PRILOSEC Take 20 mg by mouth daily.   polyethylene glycol packet Commonly known as:  MIRALAX / GLYCOLAX Take 17 g by mouth 2 (two) times daily.   pravastatin 40 MG tablet Commonly known as:  PRAVACHOL Take 40 mg by mouth at bedtime.   warfarin 5 MG tablet Commonly known as:  COUMADIN Take 2.5-5 mg by mouth See admin instructions. Take 5 mg by mouth daily on Monday and Friday. Take 2.5 mg by mouth daily on all other days            Discharge Care Instructions  (From admission, onward)        Start     Ordered   09/26/17 0000  Change dressing    Comments:  Maintain surgical dressing until follow up in the clinic. If the edges start to pull up, may reinforce with tape. If the dressing is no longer working, may remove and cover with gauze and tape, but must keep the area dry and clean.  Call with any questions or concerns.   09/26/17 9563       Signed: West Pugh. Tamiki Kuba   PA-C  09/26/2017, 12:41 PM

## 2017-09-26 NOTE — Evaluation (Signed)
Occupational Therapy Evaluation Patient Details Name: [name] [name] MRN: [id number] DOB: [birth year] Today's Date: 09/26/2017    History of Present Illness Pt is a 72 yo female s/p L TKA.   Clinical Impression   Pt was admitted for the above sx. All education was completed. No further OT is needed at this time     Follow Up Recommendations  Supervision/Assistance - 24 hour    Equipment Recommendations  None recommended by OT    Recommendations for Other Services       Precautions / Restrictions Precautions Precautions: Fall;Knee Restrictions Weight Bearing Restrictions: No Other Position/Activity Restrictions: WBAT      Mobility Bed Mobility               General bed mobility comments: oob by PT  Transfers   Equipment used: Rolling walker (2 wheeled)   Sit to Stand: Supervision              Balance                                           ADL either performed or assessed with clinical judgement   ADL Overall ADL's : Needs assistance/impaired                         Toilet Transfer: Supervision/safety;Ambulation;BSC;RW       Tub/ Shower Transfer: Min guard;Ambulation;Walk-in shower     General ADL Comments: performed adl in bathroom; set up for UB, min A for LB adls.  Reviewed knee precautions.  Pt does not have any tight spaces to walk through at home.      Vision         Perception     Praxis      Pertinent Vitals/Pain Pain Score: 4  Pain Location: L knee Pain Descriptors / Indicators: Aching;Sore Pain Intervention(s): Limited activity within patient's tolerance;Monitored during session;Premedicated before session;Repositioned;Ice applied     Hand Dominance     Extremity/Trunk Assessment Upper Extremity Assessment Upper Extremity Assessment: Overall WFL for tasks assessed           Communication Communication Communication: No difficulties   Cognition Arousal/Alertness:  Awake/alert Behavior During Therapy: WFL for tasks assessed/performed Overall Cognitive Status: Within Functional Limits for tasks assessed                                     General Comments       Exercises     Shoulder Instructions      Home Living Family/patient expects to be discharged to:: Private residence Living Arrangements: Spouse/significant other Available Help at Discharge: Family               Bathroom Shower/Tub: Walk-in Corporate treasurer Toilet: Handicapped height     Home Equipment: Shower seat;Grab bars - tub/shower          Prior Functioning/Environment Level of Independence: Independent        Comments: Pt assists her sister. Husband can help pt a little.  She has a caregiver for sister right now        OT Problem List:        OT Treatment/Interventions:      OT Goals(Current goals can be found in the care plan section) Acute  Rehab OT Goals OT Goal Formulation: All assessment and education complete, DC therapy  OT Frequency:     Barriers to D/C:            Co-evaluation              AM-PAC PT "6 Clicks" Daily Activity     Outcome Measure Help from another person eating meals?: None Help from another person taking care of personal grooming?: A Little Help from another person toileting, which includes using toliet, bedpan, or urinal?: A Little Help from another person bathing (including washing, rinsing, drying)?: A Little Help from another person to put on and taking off regular upper body clothing?: A Little Help from another person to put on and taking off regular lower body clothing?: A Little 6 Click Score: 19   End of Session    Activity Tolerance: Patient tolerated treatment well Patient left: in chair;with call bell/phone within reach  OT Visit Diagnosis: Pain Pain - Right/Left: Left Pain - part of body: Knee                Time: 7116-5790 OT Time Calculation (min): 20 min Charges:  OT  General Charges $OT Visit: 1 Visit OT Evaluation $OT Eval Low Complexity: 1 Low G-Codes:     [place], OTR/L [phone] 09/26/2017  [name] [name] 09/26/2017, 10:55 AM

## 2017-09-26 NOTE — Progress Notes (Signed)
RN reviewed discharge instructions with patient and family. All questions answered.   Paperwork and prescriptions given.   NT rolled patient down with all belongings to family car. 

## 2017-09-27 DIAGNOSIS — Z7901 Long term (current) use of anticoagulants: Secondary | ICD-10-CM | POA: Diagnosis not present

## 2017-09-27 DIAGNOSIS — Z96659 Presence of unspecified artificial knee joint: Secondary | ICD-10-CM | POA: Diagnosis not present

## 2017-09-27 DIAGNOSIS — I482 Chronic atrial fibrillation: Secondary | ICD-10-CM | POA: Diagnosis not present

## 2017-09-29 ENCOUNTER — Other Ambulatory Visit: Payer: Self-pay

## 2017-09-29 DIAGNOSIS — M25562 Pain in left knee: Secondary | ICD-10-CM | POA: Diagnosis not present

## 2017-09-29 NOTE — Patient Outreach (Signed)
[facility]) Care Management  09/29/2017  [name] [name] Dec 26, 1945 [id number]     Transition of Care Referral  Referral Date: 09/29/17 Referral Source: [employer] Discharge Report Date of Admission: 09/25/17 Diagnosis: left TKA, s/p total knee replacement Date of Discharge: 09/26/17 Facility: [place]: [facility]    Outreach attempt # 1 to patient. A female answered and reported patient was not available.     Plan: RN CM will make outreach attempt to patient within one business day.    Enzo Montgomery, RN,BSN,CCM [place] Management Telephonic Care Management Coordinator Direct Phone: [phone] Toll Free: [phone] Fax: [phone]

## 2017-10-02 ENCOUNTER — Other Ambulatory Visit: Payer: Self-pay

## 2017-10-02 DIAGNOSIS — M25562 Pain in left knee: Secondary | ICD-10-CM | POA: Diagnosis not present

## 2017-10-02 NOTE — Patient Outreach (Signed)
[facility]) Care Management  10/02/2017  JERRIYAH LOUIS 09-Jan-1946 [id number]   Transition of Care Referral  Referral Date: 09/29/17 Referral Source: [employer] Discharge Report Date of Admission: 09/25/17 Diagnosis: left TKA, s/p total knee replacement Date of Discharge: 09/26/17 Facility: [place]: [facility]   Outreach attempt #2 to patient. No answer at present and unable to leave message.     Plan: RN CM will make outreach attempt to patient within one business day.   Enzo Montgomery, RN,BSN,CCM [place] Management Telephonic Care Management Coordinator Direct Phone: [phone] Toll Free: [phone] Fax: [phone]

## 2017-10-03 ENCOUNTER — Other Ambulatory Visit: Payer: Self-pay

## 2017-10-03 NOTE — Patient Outreach (Signed)
[facility]) Care Management  10/03/2017  [name] [name] Jan 05, 1946 [id number]   Transition of Care Referral  Referral Date:09/29/17 Referral [place] Discharge Report Date of Admission:09/25/17 Diagnosis:left TKA, s/p total knee replacement Date of Discharge:09/26/17 [place] Medicare   Outreach attempt #3 to patient. No answer at present and unable to leave message.        Plan: RN CM will send unsuccessful outreach letter to patient and close case if no response within 10 business days.   Enzo Montgomery, RN,BSN,CCM [place] Management Telephonic Care Management Coordinator Direct Phone: [phone] Toll Free: [phone] Fax: [phone]

## 2017-10-04 DIAGNOSIS — Z7901 Long term (current) use of anticoagulants: Secondary | ICD-10-CM | POA: Diagnosis not present

## 2017-10-04 DIAGNOSIS — I482 Chronic atrial fibrillation: Secondary | ICD-10-CM | POA: Diagnosis not present

## 2017-10-04 DIAGNOSIS — M25562 Pain in left knee: Secondary | ICD-10-CM | POA: Diagnosis not present

## 2017-10-06 DIAGNOSIS — M25562 Pain in left knee: Secondary | ICD-10-CM | POA: Diagnosis not present

## 2017-10-09 DIAGNOSIS — M25562 Pain in left knee: Secondary | ICD-10-CM | POA: Diagnosis not present

## 2017-10-10 DIAGNOSIS — Z7901 Long term (current) use of anticoagulants: Secondary | ICD-10-CM | POA: Diagnosis not present

## 2017-10-10 DIAGNOSIS — I482 Chronic atrial fibrillation: Secondary | ICD-10-CM | POA: Diagnosis not present

## 2017-10-11 DIAGNOSIS — M25562 Pain in left knee: Secondary | ICD-10-CM | POA: Diagnosis not present

## 2017-10-13 DIAGNOSIS — M25562 Pain in left knee: Secondary | ICD-10-CM | POA: Diagnosis not present

## 2017-10-16 DIAGNOSIS — M25562 Pain in left knee: Secondary | ICD-10-CM | POA: Diagnosis not present

## 2017-10-17 ENCOUNTER — Other Ambulatory Visit: Payer: Self-pay

## 2017-10-17 NOTE — Patient Outreach (Signed)
[facility]) Care Management  10/17/2017  [name] [name] August 21, 1946 [id number]   Transition of Care Referral  Referral Date:09/29/17 Referral [place] Discharge Report Date of Admission:09/25/17 Diagnosis:left TKA, s/p total knee replacement Date of Discharge:09/26/17 [place] Medicare     Multiple attempts to establish contact with patient without success. No response from letter mailed to patient. Case is being closed at this time.      Plan: RN CM will notify [facility] administrative assistant of case status.   Enzo Montgomery, RN,BSN,CCM [place] Management Telephonic Care Management Coordinator Direct Phone: [phone] Toll Free: [phone] Fax: [phone]

## 2017-10-18 DIAGNOSIS — M25562 Pain in left knee: Secondary | ICD-10-CM | POA: Diagnosis not present

## 2017-10-20 DIAGNOSIS — M25562 Pain in left knee: Secondary | ICD-10-CM | POA: Diagnosis not present

## 2017-10-23 DIAGNOSIS — M25562 Pain in left knee: Secondary | ICD-10-CM | POA: Diagnosis not present

## 2017-10-25 DIAGNOSIS — M25562 Pain in left knee: Secondary | ICD-10-CM | POA: Diagnosis not present

## 2017-10-27 DIAGNOSIS — M25562 Pain in left knee: Secondary | ICD-10-CM | POA: Diagnosis not present

## 2017-10-31 DIAGNOSIS — M25562 Pain in left knee: Secondary | ICD-10-CM | POA: Diagnosis not present

## 2017-11-03 DIAGNOSIS — M25562 Pain in left knee: Secondary | ICD-10-CM | POA: Diagnosis not present

## 2017-11-07 DIAGNOSIS — M25562 Pain in left knee: Secondary | ICD-10-CM | POA: Diagnosis not present

## 2017-11-08 DIAGNOSIS — Z471 Aftercare following joint replacement surgery: Secondary | ICD-10-CM | POA: Diagnosis not present

## 2017-11-08 DIAGNOSIS — Z96652 Presence of left artificial knee joint: Secondary | ICD-10-CM | POA: Diagnosis not present

## 2017-11-10 DIAGNOSIS — M25562 Pain in left knee: Secondary | ICD-10-CM | POA: Diagnosis not present

## 2017-11-14 DIAGNOSIS — Z7901 Long term (current) use of anticoagulants: Secondary | ICD-10-CM | POA: Diagnosis not present

## 2017-11-14 DIAGNOSIS — I482 Chronic atrial fibrillation: Secondary | ICD-10-CM | POA: Diagnosis not present

## 2017-11-20 DIAGNOSIS — M25562 Pain in left knee: Secondary | ICD-10-CM | POA: Diagnosis not present

## 2017-11-24 DIAGNOSIS — M25562 Pain in left knee: Secondary | ICD-10-CM | POA: Diagnosis not present

## 2017-11-27 DIAGNOSIS — M25562 Pain in left knee: Secondary | ICD-10-CM | POA: Diagnosis not present

## 2017-11-30 DIAGNOSIS — D225 Melanocytic nevi of trunk: Secondary | ICD-10-CM | POA: Diagnosis not present

## 2017-11-30 DIAGNOSIS — L309 Dermatitis, unspecified: Secondary | ICD-10-CM | POA: Diagnosis not present

## 2017-11-30 DIAGNOSIS — L820 Inflamed seborrheic keratosis: Secondary | ICD-10-CM | POA: Diagnosis not present

## 2017-11-30 DIAGNOSIS — L814 Other melanin hyperpigmentation: Secondary | ICD-10-CM | POA: Diagnosis not present

## 2017-12-01 DIAGNOSIS — M25562 Pain in left knee: Secondary | ICD-10-CM | POA: Diagnosis not present

## 2017-12-04 DIAGNOSIS — M25562 Pain in left knee: Secondary | ICD-10-CM | POA: Diagnosis not present

## 2017-12-08 DIAGNOSIS — M25562 Pain in left knee: Secondary | ICD-10-CM | POA: Diagnosis not present

## 2017-12-20 DIAGNOSIS — I482 Chronic atrial fibrillation: Secondary | ICD-10-CM | POA: Diagnosis not present

## 2017-12-20 DIAGNOSIS — Z7901 Long term (current) use of anticoagulants: Secondary | ICD-10-CM | POA: Diagnosis not present

## 2018-01-22 DIAGNOSIS — M1711 Unilateral primary osteoarthritis, right knee: Secondary | ICD-10-CM | POA: Diagnosis not present

## 2018-01-22 DIAGNOSIS — M25561 Pain in right knee: Secondary | ICD-10-CM | POA: Diagnosis not present

## 2018-01-24 DIAGNOSIS — I482 Chronic atrial fibrillation: Secondary | ICD-10-CM | POA: Diagnosis not present

## 2018-01-24 DIAGNOSIS — Z7901 Long term (current) use of anticoagulants: Secondary | ICD-10-CM | POA: Diagnosis not present

## 2018-02-08 DIAGNOSIS — Z803 Family history of malignant neoplasm of breast: Secondary | ICD-10-CM | POA: Diagnosis not present

## 2018-02-08 DIAGNOSIS — Z1231 Encounter for screening mammogram for malignant neoplasm of breast: Secondary | ICD-10-CM | POA: Diagnosis not present

## 2018-02-12 NOTE — H&P (Signed)
TOTAL KNEE ADMISSION H&P  Patient is being admitted for right total knee arthroplasty.  Subjective:  Chief Complaint:   Right knee primary OA / pain  HPI: [name] [name], 72 y.o. female, has a history of pain and functional disability in the right knee due to arthritis and has failed non-surgical conservative treatments for greater than 12 weeks to includeNSAID's and/or analgesics, corticosteriod injections and activity modification.  Onset of symptoms was gradual, starting >10 years ago with gradually worsening course since that time. The patient noted no past surgery on the right knee(s).  Patient currently rates pain in the right knee(s) at 9 out of 10 with activity. Patient has night pain, worsening of pain with activity and weight bearing, pain that interferes with activities of daily living, pain with passive range of motion, crepitus and joint swelling.  Patient has evidence of periarticular osteophytes and joint space narrowing by imaging studies.  There is no active infection.  Risks, benefits and expectations were discussed with the patient.  Risks including but not limited to the risk of anesthesia, blood clots, nerve damage, blood vessel damage, failure of the prosthesis, infection and up to and including death.  Patient understand the risks, benefits and expectations and wishes to proceed with surgery.   PCP: [name], MD  D/C Plans:       Home  Post-op Meds:       No Rx given  Tranexamic Acid:      To be given - IV   Decadron:      Is to be given  FYI:     Coumadin with Lovenox  Norco  DME:   Pt already has equipment  PT:   OPPT Rx given   Patient Active Problem List   Diagnosis Date Noted  . S/P left TKA 09/25/2017  . S/P total knee replacement 09/25/2017  . Bile duct stone   . Hypertension   . Dyslipidemia   . Obesity   . GERD (gastroesophageal reflux disease)   . AF (atrial fibrillation) ([place])   . Hypercholesterolemia    Past Medical History:   Diagnosis Date  . AF (atrial fibrillation) ([place])   . Arthritis   . Cholelithiasis   . Colon polyps   . Diverticulosis   . DJD (degenerative joint disease)   . Dyslipidemia   . Fatty liver disease, nonalcoholic   . GERD (gastroesophageal reflux disease)   . Gout   . Hepatitis A AGE 15  . Hypertension   . Hypothyroidism   . Obesity     Past Surgical History:  Procedure Laterality Date  . BREAST BIOPSY Bilateral    1 right, 2 left  . CHOLECYSTECTOMY  22 YRS AGO  . ERCP  20 YRS AGO  . ERCP N/A 04/12/2016   Procedure: ENDOSCOPIC RETROGRADE CHOLANGIOPANCREATOGRAPHY (ERCP);  Surgeon: Irene Shipper, MD;  Location: [facility];  Service: Endoscopy;  Laterality: N/A;  . PLANTAR FASCIA SURGERY Left   . TOENAIL EXCISION Bilateral   . TONSILLECTOMY    . TOTAL ABDOMINAL HYSTERECTOMY     with appendectomy, COMPLETE  . TOTAL KNEE ARTHROPLASTY Left 09/25/2017   Procedure: LEFT TOTAL KNEE ARTHROPLASTY;  Surgeon: Paralee Cancel, MD;  Location: [facility];  Service: Orthopedics;  Laterality: Left;  90 mins    No current facility-administered medications for this encounter.    Current Outpatient Medications  Medication Sig Dispense Refill Last Dose  . allopurinol (ZYLOPRIM) 300 MG tablet Take 300 mg by mouth daily.  09/25/2017 at 0400  . COENZYME Q10 PO Take 1 capsule by mouth daily.    Past Week at Unknown time  . docusate sodium (COLACE) 100 MG capsule Take 1 capsule (100 mg total) by mouth 2 (two) times daily. 10 capsule 0   . enoxaparin (LOVENOX) 40 MG/0.4ML injection Inject 0.4 mLs (40 mg total) into the skin daily. 12 Syringe 0   . ferrous sulfate (FERROUSUL) 325 (65 FE) MG tablet Take 1 tablet (325 mg total) by mouth 3 (three) times daily with meals.  3   . fexofenadine (ALLEGRA) 180 MG tablet Take 180 mg by mouth every evening.    09/24/2017 at Unknown time  . furosemide (LASIX) 20 MG tablet Take 20 mg by mouth daily.    09/24/2017 at Unknown time  . HYDROcodone-acetaminophen (NORCO) 7.5-325  MG tablet Take 1-2 tablets by mouth every 4 (four) hours as needed for moderate pain or severe pain. 60 tablet 0   . levothyroxine (SYNTHROID, LEVOTHROID) 100 MCG tablet Take 100 mcg by mouth daily before breakfast.   09/25/2017 at 0400  . lisinopril-hydrochlorothiazide (PRINZIDE,ZESTORETIC) 20-12.5 MG per tablet Take 1 tablet by mouth daily.    09/24/2017 at Unknown time  . methocarbamol (ROBAXIN) 500 MG tablet Take 1 tablet (500 mg total) by mouth every 6 (six) hours as needed for muscle spasms. 40 tablet 0   . omeprazole (PRILOSEC) 20 MG capsule Take 20 mg by mouth daily.    09/25/2017 at 0400  . polyethylene glycol (MIRALAX / GLYCOLAX) packet Take 17 g by mouth 2 (two) times daily. 14 each 0   . pravastatin (PRAVACHOL) 40 MG tablet Take 40 mg by mouth at bedtime.    09/24/2017 at Unknown time  . warfarin (COUMADIN) 5 MG tablet Take 2.5-5 mg by mouth See admin instructions. Take 5 mg by mouth daily on Monday and Friday. Take 2.5 mg by mouth daily on all other days   09/20/2017   Allergies  Allergen Reactions  . Adhesive [Tape] Other (See Comments)    Tears skin    Social History   Tobacco Use  . Smoking status: Former Smoker    Types: Cigarettes    Last attempt to quit: 09/12/1998    Years since quitting: 19.4  . Smokeless tobacco: Never Used  Substance Use Topics  . Alcohol use: No    Alcohol/week: 0.0 oz    Family History  Problem Relation Age of Onset  . Diabetes Mother   . Stroke Mother   . Heart disease Father   . Diabetes Father   . Arrhythmia Sister   . Heart disease Sister        x 2  . Breast cancer Sister        x 2  . Diabetes Sister        x 2     Review of Systems  Constitutional: Negative.   HENT: Negative.   Eyes: Negative.   Respiratory: Negative.   Cardiovascular: Negative.   Gastrointestinal: Positive for heartburn.  Genitourinary: Positive for frequency.  Musculoskeletal: Positive for joint pain.  Skin: Negative.   Neurological: Negative.    Endo/Heme/Allergies: Negative.   Psychiatric/Behavioral: Negative.     Objective:  Physical Exam  Constitutional: She is oriented to person, place, and time. She appears well-developed.  HENT:  Head: Normocephalic.  Eyes: Pupils are equal, round, and reactive to light.  Neck: Neck supple. No JVD present. No tracheal deviation present. No thyromegaly present.  Cardiovascular: Normal rate, regular rhythm  and intact distal pulses.  Respiratory: Effort normal and breath sounds normal. No respiratory distress. She has no wheezes.  GI: Soft. There is no tenderness. There is no guarding.  Musculoskeletal:       Right knee: She exhibits decreased range of motion, swelling and bony tenderness. She exhibits no ecchymosis, no deformity, no laceration and no erythema. Tenderness found.  Lymphadenopathy:    She has no cervical adenopathy.  Neurological: She is alert and oriented to person, place, and time.  Skin: Skin is warm and dry.  Psychiatric: She has a normal mood and affect.      Labs:  Estimated body mass index is 37.25 kg/m as calculated from the following:   Height as of 09/25/17: 5\' 4"  (1.626 m).   Weight as of 09/25/17: 98.4 kg (217 lb).   Imaging Review Plain radiographs demonstrate severe degenerative joint disease of the right knee. The bone quality appears to be good for age and reported activity level.   Preoperative templating of the joint replacement has been completed, documented, and submitted to the Operating Room personnel in order to optimize intra-operative equipment management.   Anticipated LOS equal to or greater than 2 midnights due to - Age 53 and older with one or more of the following:  - Obesity  - Expected need for hospital services (PT, OT, Nursing) required for safe  discharge  - Anticipated need for postoperative skilled nursing care or inpatient rehab  - Active co-morbidities: Atrial fibrillation     Assessment/Plan:  End stage arthritis,  right knee   The patient history, physical examination, clinical judgment of the provider and imaging studies are consistent with end stage degenerative joint disease of the right knee and total knee arthroplasty is deemed medically necessary. The treatment options including medical management, injection therapy arthroscopy and arthroplasty were discussed at length. The risks and benefits of total knee arthroplasty were presented and reviewed. The risks due to aseptic loosening, infection, stiffness, patella tracking problems, thromboembolic complications and other imponderables were discussed. The patient acknowledged the explanation, agreed to proceed with the plan and consent was signed. Patient is being admitted for inpatient treatment for surgery, pain control, PT, OT, prophylactic antibiotics, VTE prophylaxis, progressive ambulation and ADL's and discharge planning. The patient is planning to be discharged home.     [place] Laren Whaling   PA-C  02/12/2018, 8:26 AM

## 2018-02-14 DIAGNOSIS — Z7901 Long term (current) use of anticoagulants: Secondary | ICD-10-CM | POA: Diagnosis not present

## 2018-02-14 DIAGNOSIS — I482 Chronic atrial fibrillation: Secondary | ICD-10-CM | POA: Diagnosis not present

## 2018-02-21 NOTE — Progress Notes (Signed)
[facility] 09-20-17 epic  ekg 09-19-17 epic

## 2018-02-21 NOTE — Patient Instructions (Signed)
[name] [name]  02/21/2018   Your procedure is scheduled on: 02-27-18   Report to [facility] Main  Entrance               Report to admitting at     1120 AM    Call this number if you have problems the morning of surgery [phone]    Remember: Do not eat food  :After Midnight.  YOU MAY HAVE CLEAR LIQUIDS UNTIL 0750 AM THEN NOTHING BY NOUTH  CLEAR LIQUID DIET   Foods Allowed                                                                     Foods Excluded  Coffee and tea, regular and decaf                             liquids that you cannot  Plain Jell-O in any flavor                                             see through such as: Fruit ices (not with fruit pulp)                                     milk, soups, orange juice  Iced Popsicles                                    All solid food Carbonated beverages, regular and diet                                    Cranberry, grape and apple juices Sports drinks like Gatorade Lightly seasoned clear broth or consume(fat free) Sugar, honey syrup  Sample Menu Breakfast                                Lunch                                     Supper Cranberry juice                    Beef broth                            Chicken broth Jell-O                                     Grape juice  Apple juice Coffee or tea                        Jell-O                                      Popsicle                                                Coffee or tea                        Coffee or tea  _____________________________________________________________________    Take these medicines the morning of surgery with A SIP OF WATER: OMEPRAZOLE, LEVOTHYROXINE, ALLEGRA, ALLOPURINOL, TRAMADOL IF NEEDED                                You may not have any metal on your body including hair pins and              piercings  Do not wear jewelry, make-up, lotions, powders or perfumes, deodorant       Do not wear nail polish.  Do not shave  48 hours prior to surgery.  .   Do not bring valuables to the hospital. [place].  Contacts, dentures or bridgework may not be worn into surgery.  Leave suitcase in the car. After surgery it may be brought to your room.                Please read over the following fact sheets you were given: _____________________________________________________________________           [facility] - Preparing for Surgery Before surgery, you can play an important role.  Because skin is not sterile, your skin needs to be as free of germs as possible.  You can reduce the number of germs on your skin by washing with CHG (chlorahexidine gluconate) soap before surgery.  CHG is an antiseptic cleaner which kills germs and bonds with the skin to continue killing germs even after washing. Please DO NOT use if you have an allergy to CHG or antibacterial soaps.  If your skin becomes reddened/irritated stop using the CHG and inform your nurse when you arrive at Short Stay. Do not shave (including legs and underarms) for at least 48 hours prior to the first CHG shower.  You may shave your face/neck. Please follow these instructions carefully:  1.  Shower with CHG Soap the night before surgery and the  morning of Surgery.  2.  If you choose to wash your hair, wash your hair first as usual with your  normal  shampoo.  3.  After you shampoo, rinse your hair and body thoroughly to remove the  shampoo.                           4.  Use CHG as you would any other liquid soap.  You can apply chg directly  to the skin and wash  Gently with a scrungie or clean washcloth.  5.  Apply the CHG Soap to your body ONLY FROM THE NECK DOWN.   Do not use on face/ open                           Wound or open sores. Avoid contact with eyes, ears mouth and genitals (private parts).                       Wash face,  Genitals  (private parts) with your normal soap.             6.  Wash thoroughly, paying special attention to the area where your surgery  will be performed.  7.  Thoroughly rinse your body with warm water from the neck down.  8.  DO NOT shower/wash with your normal soap after using and rinsing off  the CHG Soap.                9.  Pat yourself dry with a clean towel.            10.  Wear clean pajamas.            11.  Place clean sheets on your bed the night of your first shower and do not  sleep with pets. Day of Surgery : Do not apply any lotions/deodorants the morning of surgery.  Please wear clean clothes to the hospital/surgery center.  FAILURE TO FOLLOW THESE INSTRUCTIONS MAY RESULT IN THE CANCELLATION OF YOUR SURGERY PATIENT SIGNATURE_________________________________  NURSE SIGNATURE__________________________________  ________________________________________________________________________  WHAT IS A BLOOD TRANSFUSION? Blood Transfusion Information  A transfusion is the replacement of blood or some of its parts. Blood is made up of multiple cells which provide different functions.  Red blood cells carry oxygen and are used for blood loss replacement.  White blood cells fight against infection.  Platelets control bleeding.  Plasma helps clot blood.  Other blood products are available for specialized needs, such as hemophilia or other clotting disorders. BEFORE THE TRANSFUSION  Who gives blood for transfusions?   Healthy volunteers who are fully evaluated to make sure their blood is safe. This is blood bank blood. Transfusion therapy is the safest it has ever been in the practice of medicine. Before blood is taken from a donor, a complete history is taken to make sure that person has no history of diseases nor engages in risky social behavior (examples are intravenous drug use or sexual activity with multiple partners). The donor's travel history is screened to minimize risk of  transmitting infections, such as malaria. The donated blood is tested for signs of infectious diseases, such as HIV and hepatitis. The blood is then tested to be sure it is compatible with you in order to minimize the chance of a transfusion reaction. If you or a relative donates blood, this is often done in anticipation of surgery and is not appropriate for emergency situations. It takes many days to process the donated blood. RISKS AND COMPLICATIONS Although transfusion therapy is very safe and saves many lives, the main dangers of transfusion include:   Getting an infectious disease.  Developing a transfusion reaction. This is an allergic reaction to something in the blood you were given. Every precaution is taken to prevent this. The decision to have a blood transfusion has been considered carefully by your caregiver before blood is given. Blood is not given unless the benefits  outweigh the risks. AFTER THE TRANSFUSION  Right after receiving a blood transfusion, you will usually feel much better and more energetic. This is especially true if your red blood cells have gotten low (anemic). The transfusion raises the level of the red blood cells which carry oxygen, and this usually causes an energy increase.  The nurse administering the transfusion will monitor you carefully for complications. HOME CARE INSTRUCTIONS  No special instructions are needed after a transfusion. You may find your energy is better. Speak with your caregiver about any limitations on activity for underlying diseases you may have. SEEK MEDICAL CARE IF:   Your condition is not improving after your transfusion.  You develop redness or irritation at the intravenous (IV) site. SEEK IMMEDIATE MEDICAL CARE IF:  Any of the following symptoms occur over the next 12 hours:  Shaking chills.  You have a temperature by mouth above 102 F (38.9 C), not controlled by medicine.  Chest, back, or muscle pain.  People around you  feel you are not acting correctly or are confused.  Shortness of breath or difficulty breathing.  Dizziness and fainting.  You get a rash or develop hives.  You have a decrease in urine output.  Your urine turns a dark color or changes to pink, red, or brown. Any of the following symptoms occur over the next 10 days:  You have a temperature by mouth above 102 F (38.9 C), not controlled by medicine.  Shortness of breath.  Weakness after normal activity.  The white part of the eye turns yellow (jaundice).  You have a decrease in the amount of urine or are urinating less often.  Your urine turns a dark color or changes to pink, red, or brown. Document Released: 08/26/2000 Document Revised: 11/21/2011 Document Reviewed: 04/14/2008 ExitCare Patient Information 2014 [place].  _______________________________________________________________________  Incentive Spirometer  An incentive spirometer is a tool that can help keep your lungs clear and active. This tool measures how well you are filling your lungs with each breath. Taking long deep breaths may help reverse or decrease the chance of developing breathing (pulmonary) problems (especially infection) following:  A long period of time when you are unable to move or be active. BEFORE THE PROCEDURE   If the spirometer includes an indicator to show your best effort, your nurse or respiratory therapist will set it to a desired goal.  If possible, sit up straight or lean slightly forward. Try not to slouch.  Hold the incentive spirometer in an upright position. INSTRUCTIONS FOR USE  1. Sit on the edge of your bed if possible, or sit up as far as you can in bed or on a chair. 2. Hold the incentive spirometer in an upright position. 3. Breathe out normally. 4. Place the mouthpiece in your mouth and seal your lips tightly around it. 5. Breathe in slowly and as deeply as possible, raising the piston or the ball toward the top  of the column. 6. Hold your breath for 3-5 seconds or for as long as possible. Allow the piston or ball to fall to the bottom of the column. 7. Remove the mouthpiece from your mouth and breathe out normally. 8. Rest for a few seconds and repeat Steps 1 through 7 at least 10 times every 1-2 hours when you are awake. Take your time and take a few normal breaths between deep breaths. 9. The spirometer may include an indicator to show your best effort. Use the indicator as a goal to  work toward during each repetition. 10. After each set of 10 deep breaths, practice coughing to be sure your lungs are clear. If you have an incision (the cut made at the time of surgery), support your incision when coughing by placing a pillow or rolled up towels firmly against it. Once you are able to get out of bed, walk around indoors and cough well. You may stop using the incentive spirometer when instructed by your caregiver.  RISKS AND COMPLICATIONS  Take your time so you do not get dizzy or light-headed.  If you are in pain, you may need to take or ask for pain medication before doing incentive spirometry. It is harder to take a deep breath if you are having pain. AFTER USE  Rest and breathe slowly and easily.  It can be helpful to keep track of a log of your progress. Your caregiver can provide you with a simple table to help with this. If you are using the spirometer at home, follow these instructions: [place] IF:   You are having difficultly using the spirometer.  You have trouble using the spirometer as often as instructed.  Your pain medication is not giving enough relief while using the spirometer.  You develop fever of 100.5 F (38.1 C) or higher. SEEK IMMEDIATE MEDICAL CARE IF:   You cough up bloody sputum that had not been present before.  You develop fever of 102 F (38.9 C) or greater.  You develop worsening pain at or near the incision site. MAKE SURE YOU:   Understand these  instructions.  Will watch your condition.  Will get help right away if you are not doing well or get worse. Document Released: 01/09/2007 Document Revised: 11/21/2011 Document Reviewed: 03/12/2007 [facility] Patient Information 2014 [place], [place].   ________________________________________________________________________

## 2018-02-22 ENCOUNTER — Encounter (HOSPITAL_COMMUNITY)
Admission: RE | Admit: 2018-02-22 | Discharge: 2018-02-22 | Disposition: A | Discharge status: Home / Self Care | Payer: Medicare HMO | Source: Ambulatory Visit | Attending: Orthopedic Surgery | Admitting: Orthopedic Surgery

## 2018-02-22 ENCOUNTER — Other Ambulatory Visit: Payer: Self-pay

## 2018-02-22 ENCOUNTER — Encounter (HOSPITAL_COMMUNITY): Payer: Self-pay

## 2018-02-22 DIAGNOSIS — Z01818 Encounter for other preprocedural examination: Secondary | ICD-10-CM | POA: Insufficient documentation

## 2018-02-22 DIAGNOSIS — M1711 Unilateral primary osteoarthritis, right knee: Secondary | ICD-10-CM | POA: Insufficient documentation

## 2018-02-22 DIAGNOSIS — Z79899 Other long term (current) drug therapy: Secondary | ICD-10-CM | POA: Diagnosis not present

## 2018-02-22 DIAGNOSIS — Z7901 Long term (current) use of anticoagulants: Secondary | ICD-10-CM | POA: Insufficient documentation

## 2018-02-22 DIAGNOSIS — Z79890 Hormone replacement therapy (postmenopausal): Secondary | ICD-10-CM | POA: Insufficient documentation

## 2018-02-22 HISTORY — DX: Personal history of other diseases of the digestive system: Z87.19

## 2018-02-22 LAB — CBC
HCT: 37.9 % (ref 36.0–46.0)
Hemoglobin: 12 g/dL (ref 12.0–15.0)
MCH: 31 pg (ref 26.0–34.0)
MCHC: 31.7 g/dL (ref 30.0–36.0)
MCV: 97.9 fL (ref 78.0–100.0)
PLATELETS: 182 10*3/uL (ref 150–400)
RBC: 3.87 MIL/uL (ref 3.87–5.11)
RDW: 15.2 % (ref 11.5–15.5)
WBC: 7.2 10*3/uL (ref 4.0–10.5)

## 2018-02-22 LAB — COMPREHENSIVE METABOLIC PANEL
ALBUMIN: 4.1 g/dL (ref 3.5–5.0)
ALT: 30 U/L (ref 14–54)
ANION GAP: 9 (ref 5–15)
AST: 31 U/L (ref 15–41)
Alkaline Phosphatase: 130 U/L — ABNORMAL HIGH (ref 38–126)
BUN: 73 mg/dL — ABNORMAL HIGH (ref 6–20)
CHLORIDE: 102 mmol/L (ref 101–111)
CO2: 30 mmol/L (ref 22–32)
Calcium: 9.6 mg/dL (ref 8.9–10.3)
Creatinine, Ser: 1.64 mg/dL — ABNORMAL HIGH (ref 0.44–1.00)
GFR calc Af Amer: 34 mL/min — ABNORMAL LOW (ref >60)
GFR calc non Af Amer: 30 mL/min — ABNORMAL LOW (ref >60)
GLUCOSE: 102 mg/dL — AB (ref 65–99)
POTASSIUM: 4.7 mmol/L (ref 3.5–5.1)
SODIUM: 141 mmol/L (ref 135–145)
TOTAL PROTEIN: 7.1 g/dL (ref 6.5–8.1)
Total Bilirubin: 0.7 mg/dL (ref 0.3–1.2)

## 2018-02-22 LAB — SURGICAL PCR SCREEN
MRSA, PCR: NEGATIVE
Staphylococcus aureus: NEGATIVE

## 2018-02-22 LAB — PROTIME-INR
INR: 1.75
PROTHROMBIN TIME: 20.3 s — AB (ref 11.4–15.2)

## 2018-02-22 NOTE — Progress Notes (Signed)
cmet results routed to dr Alvan Dame by epic

## 2018-02-26 MED ORDER — TRANEXAMIC ACID 1000 MG/10ML IV SOLN
1000.0000 mg | INTRAVENOUS | Status: AC
  Administered 2018-02-27: 1000 mg via INTRAVENOUS
  Filled 2018-02-26: qty 1100

## 2018-02-27 ENCOUNTER — Inpatient Hospital Stay (HOSPITAL_COMMUNITY): Payer: Medicare HMO | Admitting: Anesthesiology

## 2018-02-27 ENCOUNTER — Observation Stay (HOSPITAL_COMMUNITY)
Admission: RE | Admit: 2018-02-27 | Discharge: 2018-02-28 | Disposition: A | Discharge status: Home / Self Care | Payer: Medicare HMO | Source: Ambulatory Visit | Attending: Orthopedic Surgery | Admitting: Orthopedic Surgery

## 2018-02-27 ENCOUNTER — Encounter (HOSPITAL_COMMUNITY): Payer: Self-pay

## 2018-02-27 ENCOUNTER — Encounter (HOSPITAL_COMMUNITY)
Admission: RE | Disposition: A | Discharge status: Home / Self Care | Payer: Self-pay | Source: Ambulatory Visit | Attending: Orthopedic Surgery

## 2018-02-27 ENCOUNTER — Other Ambulatory Visit: Payer: Self-pay

## 2018-02-27 DIAGNOSIS — E785 Hyperlipidemia, unspecified: Secondary | ICD-10-CM | POA: Insufficient documentation

## 2018-02-27 DIAGNOSIS — Z87891 Personal history of nicotine dependence: Secondary | ICD-10-CM | POA: Insufficient documentation

## 2018-02-27 DIAGNOSIS — Z96651 Presence of right artificial knee joint: Secondary | ICD-10-CM

## 2018-02-27 DIAGNOSIS — M109 Gout, unspecified: Secondary | ICD-10-CM | POA: Insufficient documentation

## 2018-02-27 DIAGNOSIS — Z79891 Long term (current) use of opiate analgesic: Secondary | ICD-10-CM | POA: Insufficient documentation

## 2018-02-27 DIAGNOSIS — Z7901 Long term (current) use of anticoagulants: Secondary | ICD-10-CM | POA: Diagnosis not present

## 2018-02-27 DIAGNOSIS — K219 Gastro-esophageal reflux disease without esophagitis: Secondary | ICD-10-CM | POA: Insufficient documentation

## 2018-02-27 DIAGNOSIS — E669 Obesity, unspecified: Secondary | ICD-10-CM | POA: Insufficient documentation

## 2018-02-27 DIAGNOSIS — E039 Hypothyroidism, unspecified: Secondary | ICD-10-CM | POA: Insufficient documentation

## 2018-02-27 DIAGNOSIS — Z79890 Hormone replacement therapy (postmenopausal): Secondary | ICD-10-CM | POA: Insufficient documentation

## 2018-02-27 DIAGNOSIS — Z6835 Body mass index (BMI) 35.0-35.9, adult: Secondary | ICD-10-CM | POA: Insufficient documentation

## 2018-02-27 DIAGNOSIS — Z888 Allergy status to other drugs, medicaments and biological substances status: Secondary | ICD-10-CM | POA: Diagnosis not present

## 2018-02-27 DIAGNOSIS — I10 Essential (primary) hypertension: Secondary | ICD-10-CM | POA: Diagnosis not present

## 2018-02-27 DIAGNOSIS — M1711 Unilateral primary osteoarthritis, right knee: Principal | ICD-10-CM | POA: Insufficient documentation

## 2018-02-27 DIAGNOSIS — Z79899 Other long term (current) drug therapy: Secondary | ICD-10-CM | POA: Insufficient documentation

## 2018-02-27 DIAGNOSIS — Z96659 Presence of unspecified artificial knee joint: Secondary | ICD-10-CM

## 2018-02-27 DIAGNOSIS — Z96652 Presence of left artificial knee joint: Secondary | ICD-10-CM | POA: Diagnosis not present

## 2018-02-27 DIAGNOSIS — Z803 Family history of malignant neoplasm of breast: Secondary | ICD-10-CM | POA: Diagnosis not present

## 2018-02-27 DIAGNOSIS — Z823 Family history of stroke: Secondary | ICD-10-CM | POA: Diagnosis not present

## 2018-02-27 DIAGNOSIS — Z86010 Personal history of colonic polyps: Secondary | ICD-10-CM | POA: Diagnosis not present

## 2018-02-27 DIAGNOSIS — I4891 Unspecified atrial fibrillation: Secondary | ICD-10-CM | POA: Insufficient documentation

## 2018-02-27 DIAGNOSIS — G8918 Other acute postprocedural pain: Secondary | ICD-10-CM | POA: Diagnosis not present

## 2018-02-27 HISTORY — PX: TOTAL KNEE ARTHROPLASTY: SHX125

## 2018-02-27 LAB — CBC
HCT: 32 % — ABNORMAL LOW (ref 36.0–46.0)
HEMOGLOBIN: 10.3 g/dL — AB (ref 12.0–15.0)
MCH: 31.1 pg (ref 26.0–34.0)
MCHC: 32.2 g/dL (ref 30.0–36.0)
MCV: 96.7 fL (ref 78.0–100.0)
Platelets: 164 10*3/uL (ref 150–400)
RBC: 3.31 MIL/uL — ABNORMAL LOW (ref 3.87–5.11)
RDW: 15.3 % (ref 11.5–15.5)
WBC: 8.1 10*3/uL (ref 4.0–10.5)

## 2018-02-27 LAB — CREATININE, SERUM
CREATININE: 1.86 mg/dL — AB (ref 0.44–1.00)
GFR calc Af Amer: 30 mL/min — ABNORMAL LOW (ref >60)
GFR calc non Af Amer: 26 mL/min — ABNORMAL LOW (ref >60)

## 2018-02-27 LAB — PROTIME-INR
INR: 1.08
PROTHROMBIN TIME: 14 s (ref 11.4–15.2)

## 2018-02-27 LAB — TYPE AND SCREEN
ABO/RH(D): AB NEG
ANTIBODY SCREEN: NEGATIVE

## 2018-02-27 SURGERY — ARTHROPLASTY, KNEE, TOTAL
Anesthesia: Spinal | Site: Knee | Laterality: Right

## 2018-02-27 MED ORDER — SODIUM CHLORIDE 0.9 % IV SOLN
INTRAVENOUS | Status: DC
  Administered 2018-02-27 – 2018-02-28 (×2): via INTRAVENOUS

## 2018-02-27 MED ORDER — METHOCARBAMOL 500 MG PO TABS
500.0000 mg | ORAL_TABLET | Freq: Four times a day (QID) | ORAL | Status: DC | PRN
  Administered 2018-02-27: 500 mg via ORAL
  Filled 2018-02-27: qty 1

## 2018-02-27 MED ORDER — MORPHINE SULFATE (PF) 2 MG/ML IV SOLN
0.5000 mg | INTRAVENOUS | Status: DC | PRN
  Administered 2018-02-27: 1 mg via INTRAVENOUS
  Filled 2018-02-27: qty 1

## 2018-02-27 MED ORDER — PRAVASTATIN SODIUM 20 MG PO TABS
40.0000 mg | ORAL_TABLET | Freq: Every day | ORAL | Status: DC
  Administered 2018-02-27: 40 mg via ORAL
  Filled 2018-02-27: qty 2

## 2018-02-27 MED ORDER — METOCLOPRAMIDE HCL 5 MG PO TABS: 5.0000 mg | ORAL_TABLET | Freq: Three times a day (TID) | ORAL | Status: DC | PRN

## 2018-02-27 MED ORDER — BISACODYL 10 MG RE SUPP: 10.0000 mg | Freq: Every day | RECTAL | Status: DC | PRN

## 2018-02-27 MED ORDER — ACETAMINOPHEN 325 MG PO TABS: 325.0000 mg | ORAL_TABLET | Freq: Four times a day (QID) | ORAL | Status: DC | PRN

## 2018-02-27 MED ORDER — NON FORMULARY: 20.0000 mg | Freq: Every day | Status: DC

## 2018-02-27 MED ORDER — MIDAZOLAM HCL 2 MG/2ML IJ SOLN: 1.0000 mg | INTRAMUSCULAR | Status: DC

## 2018-02-27 MED ORDER — WARFARIN - PHARMACIST DOSING INPATIENT
Freq: Every day | Status: DC
  Administered 2018-02-27: 18:00:00

## 2018-02-27 MED ORDER — TRANEXAMIC ACID 1000 MG/10ML IV SOLN
1000.0000 mg | Freq: Once | INTRAVENOUS | Status: AC
  Administered 2018-02-27: 1000 mg via INTRAVENOUS
  Filled 2018-02-27: qty 1100

## 2018-02-27 MED ORDER — PROPOFOL 10 MG/ML IV BOLUS
INTRAVENOUS | Status: DC | PRN
  Administered 2018-02-27: 40 mg via INTRAVENOUS
  Administered 2018-02-27: 20 mg via INTRAVENOUS

## 2018-02-27 MED ORDER — HYDROCODONE-ACETAMINOPHEN 7.5-325 MG PO TABS: 1.0000 | ORAL_TABLET | ORAL | 0 refills | Status: DC | PRN

## 2018-02-27 MED ORDER — MENTHOL 3 MG MT LOZG: 1.0000 | LOZENGE | OROMUCOSAL | Status: DC | PRN

## 2018-02-27 MED ORDER — CEFAZOLIN SODIUM-DEXTROSE 2-4 GM/100ML-% IV SOLN
2.0000 g | Freq: Four times a day (QID) | INTRAVENOUS | Status: AC
  Administered 2018-02-27 (×2): 2 g via INTRAVENOUS
  Filled 2018-02-27 (×2): qty 100

## 2018-02-27 MED ORDER — ENOXAPARIN SODIUM 40 MG/0.4ML ~~LOC~~ SOLN
40.0000 mg | SUBCUTANEOUS | Status: DC
  Administered 2018-02-28: 40 mg via SUBCUTANEOUS
  Filled 2018-02-27: qty 0.4

## 2018-02-27 MED ORDER — FERROUS SULFATE 325 (65 FE) MG PO TABS: 325.0000 mg | ORAL_TABLET | Freq: Three times a day (TID) | ORAL | 3 refills | Status: DC

## 2018-02-27 MED ORDER — PHENYLEPHRINE 40 MCG/ML (10ML) SYRINGE FOR IV PUSH (FOR BLOOD PRESSURE SUPPORT)
PREFILLED_SYRINGE | INTRAVENOUS | Status: DC | PRN
  Administered 2018-02-27 (×2): 80 ug via INTRAVENOUS

## 2018-02-27 MED ORDER — KETOROLAC TROMETHAMINE 30 MG/ML IJ SOLN
INTRAMUSCULAR | Status: DC | PRN
  Administered 2018-02-27: 30 mg

## 2018-02-27 MED ORDER — DOCUSATE SODIUM 100 MG PO CAPS: 100.0000 mg | ORAL_CAPSULE | Freq: Two times a day (BID) | ORAL | 0 refills | Status: DC

## 2018-02-27 MED ORDER — CHLORHEXIDINE GLUCONATE 4 % EX LIQD: 60.0000 mL | Freq: Once | CUTANEOUS | Status: DC

## 2018-02-27 MED ORDER — FENTANYL CITRATE (PF) 100 MCG/2ML IJ SOLN
INTRAMUSCULAR | Status: AC
  Filled 2018-02-27: qty 2

## 2018-02-27 MED ORDER — KETOROLAC TROMETHAMINE 30 MG/ML IJ SOLN
INTRAMUSCULAR | Status: AC
  Filled 2018-02-27: qty 1

## 2018-02-27 MED ORDER — METHOCARBAMOL 500 MG PO TABS: 500.0000 mg | ORAL_TABLET | Freq: Four times a day (QID) | ORAL | 0 refills | Status: DC | PRN

## 2018-02-27 MED ORDER — PHENYLEPHRINE HCL 10 MG/ML IJ SOLN
INTRAMUSCULAR | Status: AC
  Filled 2018-02-27: qty 1

## 2018-02-27 MED ORDER — OMEPRAZOLE 20 MG PO CPDR
20.0000 mg | DELAYED_RELEASE_CAPSULE | Freq: Every day | ORAL | Status: DC
  Administered 2018-02-28: 20 mg via ORAL
  Filled 2018-02-27: qty 1

## 2018-02-27 MED ORDER — ONDANSETRON HCL 4 MG/2ML IJ SOLN: 4.0000 mg | Freq: Four times a day (QID) | INTRAMUSCULAR | Status: DC | PRN

## 2018-02-27 MED ORDER — HYDROCODONE-ACETAMINOPHEN 5-325 MG PO TABS
1.0000 | ORAL_TABLET | ORAL | Status: DC | PRN
  Administered 2018-02-27: 2 via ORAL
  Filled 2018-02-27: qty 2

## 2018-02-27 MED ORDER — POLYETHYLENE GLYCOL 3350 17 G PO PACK
17.0000 g | PACK | Freq: Two times a day (BID) | ORAL | Status: DC
  Administered 2018-02-27 – 2018-02-28 (×2): 17 g via ORAL
  Filled 2018-02-27 (×2): qty 1

## 2018-02-27 MED ORDER — PROPOFOL 500 MG/50ML IV EMUL
INTRAVENOUS | Status: DC | PRN
  Administered 2018-02-27: 120 ug/kg/min via INTRAVENOUS

## 2018-02-27 MED ORDER — LORATADINE 10 MG PO TABS
10.0000 mg | ORAL_TABLET | Freq: Every day | ORAL | Status: DC
  Administered 2018-02-28: 10 mg via ORAL
  Filled 2018-02-27: qty 1

## 2018-02-27 MED ORDER — LEVOTHYROXINE SODIUM 100 MCG PO TABS
100.0000 ug | ORAL_TABLET | Freq: Every day | ORAL | Status: DC
  Administered 2018-02-28: 100 ug via ORAL
  Filled 2018-02-27: qty 1

## 2018-02-27 MED ORDER — PHENYLEPHRINE 40 MCG/ML (10ML) SYRINGE FOR IV PUSH (FOR BLOOD PRESSURE SUPPORT)
PREFILLED_SYRINGE | INTRAVENOUS | Status: AC
  Filled 2018-02-27: qty 10

## 2018-02-27 MED ORDER — SODIUM CHLORIDE 0.9 % IJ SOLN
INTRAMUSCULAR | Status: DC | PRN
  Administered 2018-02-27: 29 mL

## 2018-02-27 MED ORDER — DEXAMETHASONE SODIUM PHOSPHATE 10 MG/ML IJ SOLN
INTRAMUSCULAR | Status: AC
  Filled 2018-02-27: qty 1

## 2018-02-27 MED ORDER — MAGNESIUM CITRATE PO SOLN: 1.0000 | Freq: Once | ORAL | Status: DC | PRN

## 2018-02-27 MED ORDER — ROPIVACAINE HCL 5 MG/ML IJ SOLN
INTRAMUSCULAR | Status: DC | PRN
  Administered 2018-02-27 (×2): 5 mL via PERINEURAL

## 2018-02-27 MED ORDER — PROPOFOL 10 MG/ML IV BOLUS: INTRAVENOUS | Status: DC | PRN

## 2018-02-27 MED ORDER — ENOXAPARIN SODIUM 40 MG/0.4ML ~~LOC~~ SOLN: 40.0000 mg | SUBCUTANEOUS | 0 refills | Status: DC

## 2018-02-27 MED ORDER — DEXAMETHASONE SODIUM PHOSPHATE 10 MG/ML IJ SOLN
10.0000 mg | Freq: Once | INTRAMUSCULAR | Status: AC
  Administered 2018-02-27: 10 mg via INTRAVENOUS

## 2018-02-27 MED ORDER — FENTANYL CITRATE (PF) 100 MCG/2ML IJ SOLN
50.0000 ug | INTRAMUSCULAR | Status: DC
  Administered 2018-02-27: 50 ug via INTRAVENOUS

## 2018-02-27 MED ORDER — PROPOFOL 10 MG/ML IV BOLUS
INTRAVENOUS | Status: AC
  Filled 2018-02-27: qty 20

## 2018-02-27 MED ORDER — DOCUSATE SODIUM 100 MG PO CAPS
100.0000 mg | ORAL_CAPSULE | Freq: Two times a day (BID) | ORAL | Status: DC
  Administered 2018-02-27 – 2018-02-28 (×2): 100 mg via ORAL
  Filled 2018-02-27 (×2): qty 1

## 2018-02-27 MED ORDER — FUROSEMIDE 20 MG PO TABS
20.0000 mg | ORAL_TABLET | Freq: Every day | ORAL | Status: DC
  Administered 2018-02-27 – 2018-02-28 (×2): 20 mg via ORAL
  Filled 2018-02-27 (×2): qty 1

## 2018-02-27 MED ORDER — ALLOPURINOL 300 MG PO TABS
300.0000 mg | ORAL_TABLET | Freq: Every day | ORAL | Status: DC
  Administered 2018-02-28: 300 mg via ORAL
  Filled 2018-02-27: qty 1

## 2018-02-27 MED ORDER — SODIUM CHLORIDE 0.9 % IJ SOLN
INTRAMUSCULAR | Status: AC
  Filled 2018-02-27: qty 50

## 2018-02-27 MED ORDER — WARFARIN SODIUM 6 MG PO TABS
6.0000 mg | ORAL_TABLET | Freq: Once | ORAL | Status: AC
  Administered 2018-02-27: 6 mg via ORAL
  Filled 2018-02-27 (×2): qty 1

## 2018-02-27 MED ORDER — MIDAZOLAM HCL 2 MG/2ML IJ SOLN
INTRAMUSCULAR | Status: AC
  Filled 2018-02-27: qty 2

## 2018-02-27 MED ORDER — METOCLOPRAMIDE HCL 5 MG/ML IJ SOLN: 5.0000 mg | Freq: Three times a day (TID) | INTRAMUSCULAR | Status: DC | PRN

## 2018-02-27 MED ORDER — POLYETHYLENE GLYCOL 3350 17 G PO PACK: 17.0000 g | PACK | Freq: Two times a day (BID) | ORAL | 0 refills | Status: DC

## 2018-02-27 MED ORDER — STERILE WATER FOR IRRIGATION IR SOLN
Status: DC | PRN
  Administered 2018-02-27: 2000 mL

## 2018-02-27 MED ORDER — ALUM & MAG HYDROXIDE-SIMETH 200-200-20 MG/5ML PO SUSP: 15.0000 mL | ORAL | Status: DC | PRN

## 2018-02-27 MED ORDER — ROPIVACAINE HCL 7.5 MG/ML IJ SOLN
INTRAMUSCULAR | Status: DC | PRN
  Administered 2018-02-27 (×4): 5 mL via PERINEURAL

## 2018-02-27 MED ORDER — ONDANSETRON HCL 4 MG PO TABS: 4.0000 mg | ORAL_TABLET | Freq: Four times a day (QID) | ORAL | Status: DC | PRN

## 2018-02-27 MED ORDER — LACTATED RINGERS IV SOLN
INTRAVENOUS | Status: DC
  Administered 2018-02-27 (×2): via INTRAVENOUS

## 2018-02-27 MED ORDER — PHENOL 1.4 % MT LIQD
1.0000 | OROMUCOSAL | Status: DC | PRN
  Filled 2018-02-27: qty 177

## 2018-02-27 MED ORDER — 0.9 % SODIUM CHLORIDE (POUR BTL) OPTIME
TOPICAL | Status: DC | PRN
  Administered 2018-02-27: 1000 mL

## 2018-02-27 MED ORDER — CEFAZOLIN SODIUM-DEXTROSE 2-4 GM/100ML-% IV SOLN
2.0000 g | INTRAVENOUS | Status: AC
  Administered 2018-02-27: 2 g via INTRAVENOUS
  Filled 2018-02-27: qty 100

## 2018-02-27 MED ORDER — PHENYLEPHRINE HCL 10 MG/ML IJ SOLN
INTRAVENOUS | Status: DC | PRN
  Administered 2018-02-27: 50 ug/min via INTRAVENOUS

## 2018-02-27 MED ORDER — FERROUS SULFATE 325 (65 FE) MG PO TABS
325.0000 mg | ORAL_TABLET | Freq: Three times a day (TID) | ORAL | Status: DC
  Administered 2018-02-27 – 2018-02-28 (×3): 325 mg via ORAL
  Filled 2018-02-27 (×3): qty 1

## 2018-02-27 MED ORDER — ONDANSETRON HCL 4 MG/2ML IJ SOLN
INTRAMUSCULAR | Status: AC
  Filled 2018-02-27: qty 2

## 2018-02-27 MED ORDER — DEXAMETHASONE SODIUM PHOSPHATE 10 MG/ML IJ SOLN
10.0000 mg | Freq: Once | INTRAMUSCULAR | Status: DC
  Filled 2018-02-27: qty 1

## 2018-02-27 MED ORDER — HYDROCODONE-ACETAMINOPHEN 7.5-325 MG PO TABS
1.0000 | ORAL_TABLET | ORAL | Status: DC | PRN
  Administered 2018-02-27 – 2018-02-28 (×4): 2 via ORAL
  Filled 2018-02-27 (×4): qty 2

## 2018-02-27 MED ORDER — PROPOFOL 10 MG/ML IV BOLUS
INTRAVENOUS | Status: AC
  Filled 2018-02-27: qty 60

## 2018-02-27 MED ORDER — SODIUM CHLORIDE 0.9 % IR SOLN
Status: DC | PRN
  Administered 2018-02-27: 1000 mL

## 2018-02-27 MED ORDER — DIPHENHYDRAMINE HCL 12.5 MG/5ML PO ELIX: 12.5000 mg | ORAL_SOLUTION | ORAL | Status: DC | PRN

## 2018-02-27 MED ORDER — CELECOXIB 200 MG PO CAPS
200.0000 mg | ORAL_CAPSULE | Freq: Two times a day (BID) | ORAL | Status: DC
  Filled 2018-02-27: qty 1

## 2018-02-27 MED ORDER — BUPIVACAINE-EPINEPHRINE (PF) 0.25% -1:200000 IJ SOLN
INTRAMUSCULAR | Status: DC | PRN
  Administered 2018-02-27: 30 mL

## 2018-02-27 MED ORDER — BUPIVACAINE-EPINEPHRINE (PF) 0.25% -1:200000 IJ SOLN
INTRAMUSCULAR | Status: AC
  Filled 2018-02-27: qty 30

## 2018-02-27 MED ORDER — METHOCARBAMOL 1000 MG/10ML IJ SOLN
500.0000 mg | Freq: Four times a day (QID) | INTRAMUSCULAR | Status: DC | PRN
  Filled 2018-02-27: qty 5

## 2018-02-27 SURGICAL SUPPLY — 54 items
BAG ZIPLOCK 12X15 (MISCELLANEOUS) ×3 IMPLANT
BANDAGE ACE 6X5 VEL STRL LF (GAUZE/BANDAGES/DRESSINGS) ×3 IMPLANT
BLADE SAW SGTL 11.0X1.19X90.0M (BLADE) ×3 IMPLANT
BLADE SAW SGTL 13.0X1.19X90.0M (BLADE) ×3 IMPLANT
BOWL SMART MIX CTS (DISPOSABLE) ×3 IMPLANT
CAP KNEE TOTAL 3 SIGMA ×3 IMPLANT
CEMENT HV SMART SET (Cement) ×6 IMPLANT
COVER SURGICAL LIGHT HANDLE (MISCELLANEOUS) ×3 IMPLANT
CUFF TOURN SGL QUICK 34 (TOURNIQUET CUFF) ×2
CUFF TRNQT CYL 34X4X40X1 (TOURNIQUET CUFF) ×1 IMPLANT
DECANTER SPIKE VIAL GLASS SM (MISCELLANEOUS) ×6 IMPLANT
DERMABOND ADVANCED (GAUZE/BANDAGES/DRESSINGS) ×2
DERMABOND ADVANCED .7 DNX12 (GAUZE/BANDAGES/DRESSINGS) ×1 IMPLANT
DRAPE U-SHAPE 47X51 STRL (DRAPES) ×3 IMPLANT
DRESSING AQUACEL AG SP 3.5X10 (GAUZE/BANDAGES/DRESSINGS) ×1 IMPLANT
DRSG AQUACEL AG SP 3.5X10 (GAUZE/BANDAGES/DRESSINGS) ×3
DURAPREP 26ML APPLICATOR (WOUND CARE) ×6 IMPLANT
ELECT REM PT RETURN 15FT ADLT (MISCELLANEOUS) ×3 IMPLANT
GLOVE BIO SURGEON STRL SZ 6 (GLOVE) ×3 IMPLANT
GLOVE BIOGEL PI IND STRL 6.5 (GLOVE) ×1 IMPLANT
GLOVE BIOGEL PI IND STRL 7.0 (GLOVE) ×2 IMPLANT
GLOVE BIOGEL PI IND STRL 7.5 (GLOVE) ×3 IMPLANT
GLOVE BIOGEL PI IND STRL 8 (GLOVE) ×1 IMPLANT
GLOVE BIOGEL PI IND STRL 8.5 (GLOVE) ×1 IMPLANT
GLOVE BIOGEL PI INDICATOR 6.5 (GLOVE) ×2
GLOVE BIOGEL PI INDICATOR 7.0 (GLOVE) ×4
GLOVE BIOGEL PI INDICATOR 7.5 (GLOVE) ×6
GLOVE BIOGEL PI INDICATOR 8 (GLOVE) ×2
GLOVE BIOGEL PI INDICATOR 8.5 (GLOVE) ×2
GLOVE ECLIPSE 7.5 STRL STRAW (GLOVE) ×3 IMPLANT
GLOVE ECLIPSE 8.0 STRL XLNG CF (GLOVE) ×6 IMPLANT
GLOVE ORTHO TXT STRL SZ7.5 (GLOVE) ×3 IMPLANT
GOWN STRL REUS W/ TWL XL LVL3 (GOWN DISPOSABLE) ×1 IMPLANT
GOWN STRL REUS W/TWL 2XL LVL3 (GOWN DISPOSABLE) ×6 IMPLANT
GOWN STRL REUS W/TWL LRG LVL3 (GOWN DISPOSABLE) ×6 IMPLANT
GOWN STRL REUS W/TWL XL LVL3 (GOWN DISPOSABLE) ×2
HANDPIECE INTERPULSE COAX TIP (DISPOSABLE) ×2
HOLDER FOLEY CATH W/STRAP (MISCELLANEOUS) ×3 IMPLANT
MANIFOLD NEPTUNE II (INSTRUMENTS) ×3 IMPLANT
NDL SAFETY ECLIPSE 18X1.5 (NEEDLE) ×1 IMPLANT
NEEDLE HYPO 18GX1.5 SHARP (NEEDLE) ×2
PACK TOTAL KNEE CUSTOM (KITS) ×3 IMPLANT
POSITIONER SURGICAL ARM (MISCELLANEOUS) ×3 IMPLANT
SET HNDPC FAN SPRY TIP SCT (DISPOSABLE) ×1 IMPLANT
SET PAD KNEE POSITIONER (MISCELLANEOUS) ×3 IMPLANT
SUT MNCRL AB 4-0 PS2 18 (SUTURE) ×3 IMPLANT
SUT STRATAFIX PDS+ 0 24IN (SUTURE) ×3 IMPLANT
SUT VIC AB 1 CT1 36 (SUTURE) ×3 IMPLANT
SUT VIC AB 2-0 CT1 27 (SUTURE) ×4
SUT VIC AB 2-0 CT1 TAPERPNT 27 (SUTURE) ×2 IMPLANT
SYRINGE 3CC LL L/F (MISCELLANEOUS) ×3 IMPLANT
TRAY FOLEY CATH 14FR (SET/KITS/TRAYS/PACK) ×3 IMPLANT
WRAP KNEE MAXI GEL POST OP (GAUZE/BANDAGES/DRESSINGS) ×3 IMPLANT
YANKAUER SUCT BULB TIP 10FT TU (MISCELLANEOUS) ×3 IMPLANT

## 2018-02-27 NOTE — Anesthesia Procedure Notes (Signed)
Date/Time: 02/27/2018 1:14 PM Performed by: Sharlette Dense, CRNA Oxygen Delivery Method: Simple face mask

## 2018-02-27 NOTE — Progress Notes (Signed)
ANTICOAGULATION CONSULT NOTE - Initial Consult  Pharmacy Consult for warfarin Indication: VTE prophylaxis  Allergies  Allergen Reactions  . Adhesive [Tape] Other (See Comments)    Tears skin    Patient Measurements: Height: 5\' 4"  (162.6 cm) Weight: 207 lb 6.4 oz (94.1 kg) IBW/kg (Calculated) : 54.7  Vital Signs: Temp: 97.8 F (36.6 C) (06/18 1118) Temp Source: Oral (06/18 1118) BP: 110/48 (06/18 1305) Pulse Rate: 73 (06/18 1307)  Labs: Recent Labs    02/27/18 1130  LABPROT 14.0  INR 1.08    Estimated Creatinine Clearance: 33.5 mL/min (A) (by C-G formula based on SCr of 1.64 mg/dL (H)).   Medical History: Past Medical History:  Diagnosis Date  . AF (atrial fibrillation) ([place])   . Arthritis    OA  . Cholelithiasis   . Colon polyps   . Diverticulosis   . DJD (degenerative joint disease)   . Dyslipidemia   . Fatty liver disease, nonalcoholic   . GERD (gastroesophageal reflux disease)   . Gout   . Hepatitis A AGE 42  . History of hiatal hernia    PT THINKS  . Hypertension   . Hypothyroidism   . Obesity       Assessment: 72 yo female presents for right TKA, hx of AFib.  Pt takes warfarin 5mg  M-F and 2.5mg  on Sat and Sun.  Pharmacy to dosed warfarin.  Last dose 02/21/18.  INR 1.08  Goal of Therapy:  INR 2-3   Plan:  Warfarin 6mg  po x 1 tonight Daily INR  Dolly Rias RPh 02/27/2018, 1:44 PM Pager [phone]

## 2018-02-27 NOTE — Progress Notes (Signed)
Assisted Dr. Hatchett with right, ultrasound guided, adductor canal block. Side rails up, monitors on throughout procedure. See vital signs in flow sheet. Tolerated Procedure well.  

## 2018-02-27 NOTE — Discharge Instructions (Signed)

## 2018-02-27 NOTE — Anesthesia Postprocedure Evaluation (Signed)
Anesthesia Post Note  Patient: [name] [name]  Procedure(s) Performed: RIGHT TOTAL KNEE ARTHROPLASTY (Right Knee)     Patient location during evaluation: PACU Anesthesia Type: Spinal Level of consciousness: awake Pain management: pain level controlled Vital Signs Assessment: post-procedure vital signs reviewed and stable Respiratory status: spontaneous breathing Cardiovascular status: stable Postop Assessment: no headache, no backache, spinal receding, patient able to bend at knees and no apparent nausea or vomiting Anesthetic complications: no    Last Vitals:  Vitals:   02/27/18 1545 02/27/18 1600  BP: (!) 97/51 (!) 110/58  Pulse: 74 69  Resp: 15 17  Temp:    SpO2: 100% 100%    Last Pain:  Vitals:   02/27/18 1600  TempSrc:   PainSc: 0-No pain   Pain Goal:                 [name],[name]

## 2018-02-27 NOTE — Anesthesia Procedure Notes (Signed)
Spinal  Patient location during procedure: OR Start time: 02/27/2018 1:16 PM End time: 02/27/2018 1:20 PM Staffing Resident/CRNA: Sharlette Dense, CRNA Performed: resident/CRNA  Preanesthetic Checklist Completed: patient identified, site marked, surgical consent, pre-op evaluation, timeout performed, IV checked, risks and benefits discussed and monitors and equipment checked Spinal Block Patient position: sitting Prep: Betadine Patient monitoring: heart rate, continuous pulse ox and blood pressure Approach: midline Location: L3-4 Injection technique: single-shot Needle Needle type: Sprotte  Needle gauge: 24 G Needle length: 9 cm Additional Notes Kit expiration date 03/13/2019 and lot #[id number] Clear CSF free flowing, negative heme, negative paresthesia Tolerated well and returned to supine position

## 2018-02-27 NOTE — Op Note (Signed)
NAME:  [name] [name]                      MEDICAL RECORD NO.:  [id number]                             FACILITY:  [facility]      PHYSICIAN:  Pietro Cassis. Alvan Dame, M.D.  DATE OF BIRTH:  1945-12-06      DATE OF PROCEDURE:  02/27/2018                                     OPERATIVE REPORT         PREOPERATIVE DIAGNOSIS:  Right knee osteoarthritis.      POSTOPERATIVE DIAGNOSIS:  Right knee osteoarthritis.      FINDINGS:  The patient was noted to have complete loss of cartilage and   bone-on-bone arthritis with associated osteophytes in the lateral and patellofemoral compartments of   the knee with a significant synovitis and associated effusion.  The patient had failed months of conservative treatment including medications, injection therapy, activity modification.     PROCEDURE:  Right total knee replacement.      COMPONENTS USED:  De[employer] Sigma rotating platform posterior stabilized knee   system, a size 4N femur, 3 tibia, size 10 mm PS insert, and 38 patellar  button.      SURGEON:  Pietro Cassis. Alvan Dame, M.D.      ASSISTANT:  Danae Orleans, PA-C.      ANESTHESIA:  Regional and Spinal.      SPECIMENS:  None.      COMPLICATION:  None.      DRAINS:  None.  EBL: <100cc      TOURNIQUET TIME:   Total Tourniquet Time Documented: Thigh (Right) - -29 minutes Total: Thigh (Right) - -29 minutes  .      The patient was stable to the recovery room.      INDICATION FOR PROCEDURE:  [name] [name] is a 72 y.o. female patient of   mine.  The patient had been seen, evaluated, and treated for months conservatively in the   office with medication, activity modification, and injections.  The patient had   radiographic changes of bone-on-bone arthritis with endplate sclerosis and osteophytes noted.  Based on the radiographic changes and failed conservative measures, the patient   decided to proceed with definitive treatment, total knee replacement.  Risks of infection, DVT, component failure, need for  revision surgery, neurovascular injury were reviewed in the office setting.  The postop course was reviewed stressing the efforts to maximize post-operative satisfaction and function.  Consent was obtained for benefit of pain   relief.      PROCEDURE IN DETAIL:  The patient was brought to the operative theater.   Once adequate anesthesia, preoperative antibiotics, 2 gm of Ancef,1 gm of Tranexamic Acid, and 10 mg of Decadron administered, the patient was positioned supine with a right thigh tourniquet placed.  The  right lower extremity was prepped and draped in sterile fashion.  A time-   out was performed identifying the patient, planned procedure, and the appropriate extremity.      The right lower extremity was placed in the [facility] leg holder.  The leg was   exsanguinated, tourniquet elevated to 250 mmHg.  A midline incision was   made followed by median  parapatellar arthrotomy.  Following initial   exposure, attention was first directed to the patella.  Precut   measurement was noted to be 23 mm.  I resected down to 14 mm and used a   38 patellar button to restore patellar height as well as cover the cut surface.      The lug holes were drilled and a metal shim was placed to protect the   patella from retractors and saw blade during the procedure.      At this point, attention was now directed to the femur.  The femoral   canal was opened with a drill, irrigated to try to prevent fat emboli.  An   intramedullary rod was passed at 3 degrees valgus, 9 mm of bone was   resected off the distal femur.  Following this resection, the tibia was   subluxated anteriorly.  Using the extramedullary guide, 2 mm of bone was resected off   the proximal lateral tibia.  We confirmed the gap would be   stable medially and laterally with a size 10 spacer block as well as confirmed that the tibial cut was perpendicular in the coronal plane, checking with an alignment rod.      Once this was done, I sized  the femur to be a size 4 in the anterior-   posterior dimension, chose a narrow component based on medial and   lateral dimension.  The size 4 rotation block was then pinned in   position anterior referenced using the C-clamp to set rotation.  The   anterior, posterior, and  chamfer cuts were made without difficulty nor   notching making certain that I was along the anterior cortex to help   with flexion gap stability.      The final box cut was made off the lateral aspect of distal femur.      At this point, the tibia was sized to be a size 3.  The size 3 tray was   then pinned in position through the medial third of the tubercle,   drilled, and keel punched.  Trial reduction was now carried with a 4 femur,  3 tibia, a size 10 mm PS insert, and the 38 patella botton.  The knee was brought to full extension with good flexion stability with the patella   tracking through the trochlea without application of pressure.  Given   all these findings the trial components removed.  Final components were   opened and cement was mixed.  The knee was irrigated with normal saline solution and pulse lavage.  The synovial lining was   then injected with 30 cc of 0.25% Marcaine with epinephrine, 1 cc of Toradol and 30 cc of NS for a total of 61 cc.     Final implants were then cemented onto cleaned and dried cut surfaces of bone with the knee brought to extension with a size 10 mm PS trial insert.      Once the cement had fully cured, excess cement was removed   throughout the knee.  I confirmed that I was satisfied with the range of   motion and stability, and the final size 10 mm PS insert was chosen.  It was   placed into the knee.      The tourniquet had been let down at 29 minutes.  No significant   hemostasis was required.  The extensor mechanism was then reapproximated using #1 Vicryl and #1 Stratafix sutures with the knee  in flexion.  The   remaining wound was closed with 2-0 Vicryl and  running 4-0 Monocryl.   The knee was cleaned, dried, dressed sterilely using Dermabond and   Aquacel dressing.  The patient was then   brought to recovery room in stable condition, tolerating the procedure   well.   Please note that Physician Assistant, Danae Orleans, PA-C was present for the entirety of the case, and was utilized for pre-operative positioning, peri-operative retractor management, general facilitation of the procedure and for primary wound closure at the end of the case.              Pietro Cassis Alvan Dame, M.D.    02/27/2018 2:41 PM

## 2018-02-27 NOTE — Anesthesia Preprocedure Evaluation (Signed)
Anesthesia Evaluation  Patient identified by MRN, date of birth, ID band Patient awake    Reviewed: Allergy & Precautions, NPO status , Patient's Chart, lab work & pertinent test results  Airway Mallampati: II  TM Distance: >3 FB Neck ROM: Full    Dental no notable dental hx. (+) Edentulous Upper, Edentulous Lower   Pulmonary neg pulmonary ROS, former smoker,    Pulmonary exam normal breath sounds clear to auscultation       Cardiovascular hypertension, Pt. on medications Normal cardiovascular exam+ dysrhythmias Atrial Fibrillation  Rhythm:Regular Rate:Normal     Neuro/Psych negative neurological ROS  negative psych ROS   GI/Hepatic GERD  ,(+) Hepatitis -  Endo/Other  Hypothyroidism   Renal/GU negative Renal ROS     Musculoskeletal  (+) Arthritis ,   Abdominal   Peds  Hematology negative hematology ROS (+)   Anesthesia Other Findings   Reproductive/Obstetrics negative OB ROS                             Anesthesia Physical  Anesthesia Plan  ASA: III  Anesthesia Plan: Spinal   Post-op Pain Management:  Regional for Post-op pain   Induction: Intravenous  PONV Risk Score and Plan: 2 and Ondansetron and Propofol infusion  Airway Management Planned: Simple Face Mask  Additional Equipment:   Intra-op Plan:   Post-operative Plan:   Informed Consent: I have reviewed the patients History and Physical, chart, labs and discussed the procedure including the risks, benefits and alternatives for the proposed anesthesia with the patient or authorized representative who has indicated his/her understanding and acceptance.   Dental advisory given  Plan Discussed with: CRNA  Anesthesia Plan Comments:         Anesthesia Quick Evaluation

## 2018-02-27 NOTE — Anesthesia Procedure Notes (Addendum)
Anesthesia Regional Block: Adductor canal block   Pre-Anesthetic Checklist: ,, timeout performed, Correct Patient, Correct Site, Correct Laterality, Correct Procedure, Correct Position, site marked, Risks and benefits discussed,  Surgical consent,  Pre-op evaluation,  At surgeon's request and post-op pain management  Laterality: Right and Lower  Prep: chloraprep       Needles:  Injection technique: Single-shot  Needle Type: Echogenic Stimulator Needle     Needle Length: 10cm  Needle Gauge: 21   Needle insertion depth: 4 cm   Additional Needles:   Procedures:,,,, ultrasound used (permanent image in chart),,,,  Narrative:  Start time: 02/27/2018 12:51 PM End time: 02/27/2018 1:01 PM Injection made incrementally with aspirations every 5 mL.

## 2018-02-27 NOTE — Transfer of Care (Signed)
Immediate Anesthesia Transfer of Care Note  Patient: [name] [name]  Procedure(s) Performed: RIGHT TOTAL KNEE ARTHROPLASTY (Right Knee)  Patient Location: PACU  Anesthesia Type:Spinal  Level of Consciousness: awake, alert  and oriented  Airway & Oxygen Therapy: Patient Spontanous Breathing and Patient connected to face mask oxygen  Post-op Assessment: Report given to RN and Post -op Vital signs reviewed and stable  Post vital signs: Reviewed and stable  Last Vitals:  Vitals Value Taken Time  BP    Temp    Pulse    Resp    SpO2      Last Pain:  Vitals:   02/27/18 1300  TempSrc:   PainSc: 0-No pain         Complications: No apparent anesthesia complications

## 2018-02-27 NOTE — Interval H&P Note (Signed)
History and Physical Interval Note:  02/27/2018 11:59 AM  [name] [name]  has presented today for surgery, with the diagnosis of Right knee osteoarthritis  The various methods of treatment have been discussed with the patient and family. After consideration of risks, benefits and other options for treatment, the patient has consented to  Procedure(s): RIGHT TOTAL KNEE ARTHROPLASTY (Right) as a surgical intervention .  The patient's history has been reviewed, patient examined, no change in status, stable for surgery.  I have reviewed the patient's chart and labs.  Questions were answered to the patient's satisfaction.     [name]

## 2018-02-28 ENCOUNTER — Encounter (HOSPITAL_COMMUNITY): Payer: Self-pay | Admitting: Orthopedic Surgery

## 2018-02-28 DIAGNOSIS — Z79890 Hormone replacement therapy (postmenopausal): Secondary | ICD-10-CM | POA: Diagnosis not present

## 2018-02-28 DIAGNOSIS — E039 Hypothyroidism, unspecified: Secondary | ICD-10-CM | POA: Diagnosis not present

## 2018-02-28 DIAGNOSIS — I10 Essential (primary) hypertension: Secondary | ICD-10-CM | POA: Diagnosis not present

## 2018-02-28 DIAGNOSIS — Z888 Allergy status to other drugs, medicaments and biological substances status: Secondary | ICD-10-CM | POA: Diagnosis not present

## 2018-02-28 DIAGNOSIS — E785 Hyperlipidemia, unspecified: Secondary | ICD-10-CM | POA: Diagnosis not present

## 2018-02-28 DIAGNOSIS — Z79891 Long term (current) use of opiate analgesic: Secondary | ICD-10-CM | POA: Diagnosis not present

## 2018-02-28 DIAGNOSIS — Z7901 Long term (current) use of anticoagulants: Secondary | ICD-10-CM | POA: Diagnosis not present

## 2018-02-28 DIAGNOSIS — M1711 Unilateral primary osteoarthritis, right knee: Secondary | ICD-10-CM | POA: Diagnosis not present

## 2018-02-28 DIAGNOSIS — Z96652 Presence of left artificial knee joint: Secondary | ICD-10-CM | POA: Diagnosis not present

## 2018-02-28 LAB — BASIC METABOLIC PANEL
Anion gap: 9 (ref 5–15)
BUN: 72 mg/dL — AB (ref 6–20)
CALCIUM: 9.1 mg/dL (ref 8.9–10.3)
CHLORIDE: 109 mmol/L (ref 101–111)
CO2: 23 mmol/L (ref 22–32)
CREATININE: 1.98 mg/dL — AB (ref 0.44–1.00)
GFR calc Af Amer: 27 mL/min — ABNORMAL LOW (ref >60)
GFR calc non Af Amer: 24 mL/min — ABNORMAL LOW (ref >60)
Glucose, Bld: 146 mg/dL — ABNORMAL HIGH (ref 65–99)
Potassium: 4.7 mmol/L (ref 3.5–5.1)
Sodium: 141 mmol/L (ref 135–145)

## 2018-02-28 LAB — CBC
HEMATOCRIT: 28.5 % — AB (ref 36.0–46.0)
HEMOGLOBIN: 9.5 g/dL — AB (ref 12.0–15.0)
MCH: 31.8 pg (ref 26.0–34.0)
MCHC: 33.3 g/dL (ref 30.0–36.0)
MCV: 95.3 fL (ref 78.0–100.0)
Platelets: 163 10*3/uL (ref 150–400)
RBC: 2.99 MIL/uL — ABNORMAL LOW (ref 3.87–5.11)
RDW: 15 % (ref 11.5–15.5)
WBC: 15.2 10*3/uL — ABNORMAL HIGH (ref 4.0–10.5)

## 2018-02-28 LAB — PROTIME-INR
INR: 1.2
Prothrombin Time: 15.1 seconds (ref 11.4–15.2)

## 2018-02-28 MED ORDER — ENOXAPARIN SODIUM 30 MG/0.3ML ~~LOC~~ SOLN: 30.0000 mg | SUBCUTANEOUS | Status: DC

## 2018-02-28 MED ORDER — WARFARIN SODIUM 6 MG PO TABS
6.0000 mg | ORAL_TABLET | Freq: Once | ORAL | Status: DC
  Filled 2018-02-28: qty 1

## 2018-02-28 NOTE — Evaluation (Signed)
Physical Therapy Evaluation Patient Details Name: [name] [name] MRN: [id number] DOB: Jun 15, 1946 Today's Date: 02/28/2018   History of Present Illness  R tka, LTKA 1/19  Clinical Impression  The patient is progressing well. Plans Dc today after PT and should be ready.     Follow Up Recommendations Outpatient PT;Follow surgeon's recommendation for DC plan and follow-up therapies    Equipment Recommendations  None recommended by PT    Recommendations for Other Services       Precautions / Restrictions Precautions Precautions: Fall;Knee      Mobility  Bed Mobility Overal bed mobility: Independent                Transfers Overall transfer level: Needs assistance Equipment used: Rolling walker (2 wheeled) Transfers: Sit to/from Stand Sit to Stand: Supervision            Ambulation/Gait Ambulation/Gait assistance: Supervision Gait Distance (Feet): 200 Feet Assistive device: Rolling walker (2 wheeled) Gait Pattern/deviations: Step-through pattern     General Gait Details: cues for sequence  Stairs            Wheelchair Mobility    Modified Rankin (Stroke Patients Only)       Balance                                             Pertinent Vitals/Pain Pain Assessment: No/denies pain    Home Living Family/patient expects to be discharged to:: Private residence Living Arrangements: Spouse/significant other;Other relatives Available Help at Discharge: Family Type of Home: House Home Access: Stairs to enter   [employer] of Steps: 1 Home Layout: One level Home Equipment: Shower seat;Grab bars - tub/shower;Walker - 2 wheels;Bedside commode      Prior Function Level of Independence: Independent         Comments: Pt assists her sister. Husband can help pt a little.  She has a caregiver for sister right now     Hand Dominance        Extremity/Trunk Assessment   Upper Extremity Assessment Upper Extremity  Assessment: Overall WFL for tasks assessed    Lower Extremity Assessment Lower Extremity Assessment: RLE deficits/detail RLE Deficits / Details: knee flexion 0-80, SLR+       Communication   Communication: No difficulties  Cognition Arousal/Alertness: Awake/alert Behavior During Therapy: WFL for tasks assessed/performed                                          General Comments      Exercises Total Joint Exercises Ankle Circles/Pumps: AROM;Both;10 reps Quad Sets: AROM;Both;10 reps Towel Squeeze: AROM;Right;10 reps Short Arc Quad: AROM;Right;10 reps Heel Slides: AROM;Right;10 reps Hip ABduction/ADduction: AROM;10 reps;Right Straight Leg Raises: AROM;Right;10 reps   Assessment/Plan    PT Assessment Patient needs continued PT services  PT Problem List Decreased strength;Decreased range of motion;Decreased activity tolerance;Decreased mobility;Decreased knowledge of precautions;Decreased safety awareness;Decreased knowledge of use of DME       PT Treatment Interventions DME instruction;Gait training;Stair training;Functional mobility training;Therapeutic activities;Therapeutic exercise    PT Goals (Current goals can be found in the Care Plan section)  Acute Rehab PT Goals Patient Stated Goal: to go home PT Goal Formulation: With patient Time For Goal Achievement: 03/01/18 Potential to Achieve Goals: Good  Frequency 7X/week   Barriers to discharge        Co-evaluation               AM-PAC PT "6 Clicks" Daily Activity  Outcome Measure Difficulty turning over in bed (including adjusting bedclothes, sheets and blankets)?: None Difficulty moving from lying on back to sitting on the side of the bed? : None Difficulty sitting down on and standing up from a chair with arms (e.g., wheelchair, bedside commode, etc,.)?: A Little Help needed moving to and from a bed to chair (including a wheelchair)?: A Little Help needed walking in hospital room?: A  Little Help needed climbing 3-5 steps with a railing? : A Little 6 Click Score: 20    End of Session   Activity Tolerance: Patient tolerated treatment well Patient left: in chair;with call bell/phone within reach   PT Visit Diagnosis: Unsteadiness on feet (R26.81)    Time: 9692-4932 PT Time Calculation (min) (ACUTE ONLY): 20 min   Charges:   PT Evaluation $PT Eval Low Complexity: 1 Low     PT G CodesTresa Endo PT [phone]  Claretha Cooper 02/28/2018, 12:44 PM

## 2018-02-28 NOTE — Progress Notes (Signed)
[place] for warfarin Indication: VTE prophylaxis  Allergies  Allergen Reactions  . Adhesive [Tape] Other (See Comments)    Tears skin    Patient Measurements: Height: 5\' 4"  (162.6 cm) Weight: 207 lb 6.4 oz (94.1 kg) IBW/kg (Calculated) : 54.7  Vital Signs: Temp: 98.1 F (36.7 C) (06/19 0614) Temp Source: Oral (06/19 0614) BP: 95/67 (06/19 0614) Pulse Rate: 85 (06/19 0614)  Labs: Recent Labs    02/27/18 1130 02/27/18 1706 02/28/18 0541  HGB  --  10.3* 9.5*  HCT  --  32.0* 28.5*  PLT  --  164 163  LABPROT 14.0  --  15.1  INR 1.08  --  1.20  CREATININE  --  1.86* 1.98*    Estimated Creatinine Clearance: 27.7 mL/min (A) (by C-G formula based on SCr of 1.98 mg/dL (H)).   Medical History: Past Medical History:  Diagnosis Date  . AF (atrial fibrillation) ([place])   . Arthritis    OA  . Cholelithiasis   . Colon polyps   . Diverticulosis   . DJD (degenerative joint disease)   . Dyslipidemia   . Fatty liver disease, nonalcoholic   . GERD (gastroesophageal reflux disease)   . Gout   . Hepatitis A AGE 66  . History of hiatal hernia    PT THINKS  . Hypertension   . Hypothyroidism   . Obesity       Assessment: 72 yo female presents for right TKA, hx of AFib.  Pt takes warfarin 5mg  M-F and 2.5mg  on Sat and Sun.  Pharmacy to dosed warfarin.  Last dose 02/21/18.    02/28/2018 INR 1.2 H/H low Plts WNL  Goal of Therapy:  INR 2-3   Plan:  Warfarin 6mg  po x 1 tonight Enoxaparin bridge per MD Daily INR  Dolly Rias RPh 02/28/2018, 8:27 AM Pager [phone]

## 2018-02-28 NOTE — Progress Notes (Signed)
     Subjective: 1 Day Post-Op Procedure(s) (LRB): RIGHT TOTAL KNEE ARTHROPLASTY (Right)   Patient reports pain as mild, pain controlled. No events throughout the night. States that the knee is doing well and she has been through the left TKA earlier this year.  Looking forward to progressing with PT.  Ready to be discharged home.   Patient's anticipated LOS is less than 2 midnights, meeting these requirements: - Lives within 1 hour of care - Has a competent adult at home to recover with post-op recover - NO history of  - Chronic pain requiring opiods  - Diabetes  - Coronary Artery Disease  - Heart failure  - Heart attack  - Stroke  - DVT/VTE  - Cardiac arrhythmia  - Respiratory Failure/COPD  - Anemia   Objective:   VITALS:   Vitals:   02/28/18 0148 02/28/18 0614  BP: 127/82 95/67  Pulse: 78 85  Resp: 16 16  Temp: 98 F (36.7 C) 98.1 F (36.7 C)  SpO2: 100% 99%    Dorsiflexion/Plantar flexion intact Incision: dressing C/D/I No cellulitis present Compartment soft  LABS Recent Labs    02/27/18 1706 02/28/18 0541  HGB 10.3* 9.5*  HCT 32.0* 28.5*  WBC 8.1 15.2*  PLT 164 163    Recent Labs    02/27/18 1706 02/28/18 0541  NA  --  141  K  --  4.7  BUN  --  72*  CREATININE 1.86* 1.98*  GLUCOSE  --  146*     Assessment/Plan: 1 Day Post-Op Procedure(s) (LRB): RIGHT TOTAL KNEE ARTHROPLASTY (Right) Foley cath d/c'ed Advance diet Up with therapy D/C IV fluids Discharge home Follow up in 2 weeks at [facility] Follow up with OLIN,Omarii Scalzo D in 2 weeks.  Contact information:  [facility]) [address], [place] [phone]         Obese (BMI 30-39.9) Estimated body mass index is 35.6 kg/m as calculated from the following:   Height as of this encounter: 5\' 4"  (1.626 m).   Weight as of this encounter: 94.1 kg (207 lb 6.4 oz). Patient also  counseled that weight may inhibit the healing process Patient counseled that losing weight will help with future health issues      West Pugh. Sergey Ishler   PAC  02/28/2018, 8:45 AM

## 2018-02-28 NOTE — Progress Notes (Signed)
Physical Therapy Treatment Patient Details Name: [name] [name] MRN: [id number] DOB: 21-Nov-1945 Today's Date: 02/28/2018    History of Present Illness R tka, LTKA 1/19    PT Comments    The patient is progressing well. Ready for Dc.   Follow Up Recommendations  Outpatient PT;Follow surgeon's recommendation for DC plan and follow-up therapies     Equipment Recommendations  None recommended by PT    Recommendations for Other Services       Precautions / Restrictions Precautions Precautions: Fall;Knee    Mobility  Bed Mobility Overal bed mobility: Independent                Transfers Overall transfer level: Needs assistance Equipment used: Rolling walker (2 wheeled) Transfers: Sit to/from Stand Sit to Stand: Supervision            Ambulation/Gait Ambulation/Gait assistance: Supervision Gait Distance (Feet): 100 Feet Assistive device: Rolling walker (2 wheeled) Gait Pattern/deviations: Step-through pattern     General Gait Details: cues for sequence   Stairs             Wheelchair Mobility    Modified Rankin (Stroke Patients Only)       Balance                                            Cognition Arousal/Alertness: Awake/alert Behavior During Therapy: WFL for tasks assessed/performed                                          Exercises    General Comments        Pertinent Vitals/Pain Pain Assessment: 0-10 Pain Score: 4  Pain Descriptors / Indicators: Discomfort;Sore Pain Intervention(s): Monitored during session;RN gave pain meds during session    [place] expects to be discharged to:: Private residence Living Arrangements: Spouse/significant other;Other relatives Available Help at Discharge: Family Type of Home: House Home Access: Stairs to enter   Home Layout: One level Home Equipment: Shower seat;Grab bars - tub/shower;Walker - 2 wheels;Bedside commode      Prior  Function Level of Independence: Independent      Comments: Pt assists her sister. Husband can help pt a little.  She has a caregiver for sister right now   PT Goals (current goals can now be found in the care plan section) Acute Rehab PT Goals Patient Stated Goal: to go home PT Goal Formulation: With patient Time For Goal Achievement: 03/01/18 Potential to Achieve Goals: Good Progress towards PT goals: Progressing toward goals    Frequency    7X/week      PT Plan Current plan remains appropriate    Co-evaluation              AM-PAC PT "6 Clicks" Daily Activity  Outcome Measure  Difficulty turning over in bed (including adjusting bedclothes, sheets and blankets)?: None Difficulty moving from lying on back to sitting on the side of the bed? : None Difficulty sitting down on and standing up from a chair with arms (e.g., wheelchair, bedside commode, etc,.)?: A Little Help needed moving to and from a bed to chair (including a wheelchair)?: A Little Help needed walking in hospital room?: A Little Help needed climbing 3-5 steps with a railing? : A Little 6 Click  Score: 20    End of Session   Activity Tolerance: Patient tolerated treatment well Patient left: in bed Nurse Communication: Mobility status PT Visit Diagnosis: Unsteadiness on feet (R26.81)     Time: 4360-1658 PT Time Calculation (min) (ACUTE ONLY): 22 min  Charges:  $Gait Training: 8-22 mins                    G CodesTresa Endo PT [phone]    Claretha Cooper 02/28/2018, 2:56 PM

## 2018-03-01 DIAGNOSIS — Z7901 Long term (current) use of anticoagulants: Secondary | ICD-10-CM | POA: Diagnosis not present

## 2018-03-01 DIAGNOSIS — I482 Chronic atrial fibrillation: Secondary | ICD-10-CM | POA: Diagnosis not present

## 2018-03-02 DIAGNOSIS — M25561 Pain in right knee: Secondary | ICD-10-CM | POA: Diagnosis not present

## 2018-03-02 NOTE — Discharge Summary (Signed)
Physician Discharge Summary  Patient ID: [name] [name] MRN: [id number] DOB/AGE: Mar 14, 1946 72 y.o.  Admit date: 02/27/2018 Discharge date: 02/28/2018   Procedures:  Procedure(s) (LRB): RIGHT TOTAL KNEE ARTHROPLASTY (Right)  Attending Physician:  Dr. Paralee Cancel   Admission Diagnoses:   Right knee primary OA / pain  Discharge Diagnoses:  Principal Problem:   S/P right TKA Active Problems:   S/P total knee replacement  Past Medical History:  Diagnosis Date  . AF (atrial fibrillation) ([place])   . Arthritis    OA  . Cholelithiasis   . Colon polyps   . Diverticulosis   . DJD (degenerative joint disease)   . Dyslipidemia   . Fatty liver disease, nonalcoholic   . GERD (gastroesophageal reflux disease)   . Gout   . Hepatitis A AGE 69  . History of hiatal hernia    PT THINKS  . Hypertension   . Hypothyroidism   . Obesity     HPI:    [name] [name], 72 y.o. female, has a history of pain and functional disability in the right knee due to arthritis and has failed non-surgical conservative treatments for greater than 12 weeks to includeNSAID's and/or analgesics, corticosteriod injections and activity modification.  Onset of symptoms was gradual, starting >10 years ago with gradually worsening course since that time. The patient noted no past surgery on the right knee(s).  Patient currently rates pain in the right knee(s) at 9 out of 10 with activity. Patient has night pain, worsening of pain with activity and weight bearing, pain that interferes with activities of daily living, pain with passive range of motion, crepitus and joint swelling.  Patient has evidence of periarticular osteophytes and joint space narrowing by imaging studies.  There is no active infection.  Risks, benefits and expectations were discussed with the patient.  Risks including but not limited to the risk of anesthesia, blood clots, nerve damage, blood vessel damage, failure of the prosthesis, infection and up to and  including death.  Patient understand the risks, benefits and expectations and wishes to proceed with surgery.   PCP: [name], MD   Discharged Condition: good  Hospital Course:  Patient underwent the above stated procedure on 02/27/2018. Patient tolerated the procedure well and brought to the recovery room in good condition and subsequently to the floor.  POD #1 BP: 95/67 ; Pulse: 85 ; Temp: 98.1 F (36.7 C) ; Resp: 16 Patient reports pain as mild, pain controlled. No events throughout the night. States that the knee is doing well and she has been through the left TKA earlier this year.  Looking forward to progressing with PT.  Ready to be discharged home. Dorsiflexion/plantar flexion intact, incision: dressing C/D/I, no cellulitis present and compartment soft.   LABS  Basename    HGB     9.5  HCT     28.5    Discharge Exam: General appearance: alert, cooperative and no distress Extremities: Homans sign is negative, no sign of DVT, no edema, redness or tenderness in the calves or thighs and no ulcers, gangrene or trophic changes  Disposition:  Home with follow up in 2 weeks   Follow-up Information    Paralee Cancel, MD. Schedule an appointment as soon as possible for a visit in 2 weeks.   Specialty:  Orthopedic Surgery Contact information: [address] [place] [address] [phone]           Discharge Instructions    Call MD /  Call 911   Complete by:  As directed    If you experience chest pain or shortness of breath, CALL 911 and be transported to the hospital emergency room.  If you develope a fever above 101 F, pus (white drainage) or increased drainage or redness at the wound, or calf pain, call your surgeon's office.   Change dressing   Complete by:  As directed    Maintain surgical dressing until follow up in the clinic. If the edges start to pull up, may reinforce with tape. If the dressing is no longer working, may remove and cover  with gauze and tape, but must keep the area dry and clean.  Call with any questions or concerns.   Constipation Prevention   Complete by:  As directed    Drink plenty of fluids.  Prune juice may be helpful.  You may use a stool softener, such as Colace (over the counter) 100 mg twice a day.  Use MiraLax (over the counter) for constipation as needed.   Diet - low sodium heart healthy   Complete by:  As directed    Discharge instructions   Complete by:  As directed    Maintain surgical dressing until follow up in the clinic. If the edges start to pull up, may reinforce with tape. If the dressing is no longer working, may remove and cover with gauze and tape, but must keep the area dry and clean.  Follow up in 2 weeks at [facility]. Call with any questions or concerns.   Increase activity slowly as tolerated   Complete by:  As directed    Weight bearing as tolerated with assist device (walker, cane, etc) as directed, use it as long as suggested by your surgeon or therapist, typically at least 4-6 weeks.   TED hose   Complete by:  As directed    Use stockings (TED hose) for 2 weeks on both leg(s).  You may remove them at night for sleeping.      Allergies as of 02/28/2018      Reactions   Adhesive [tape] Other (See Comments)   Tears skin      Medication List    STOP taking these medications   traMADol 50 MG tablet Commonly known as:  ULTRAM   UNABLE TO FIND     TAKE these medications   allopurinol 300 MG tablet Commonly known as:  ZYLOPRIM Take 300 mg by mouth daily.   COENZYME Q10 PO Take 1 capsule by mouth daily.   docusate sodium 100 MG capsule Commonly known as:  COLACE Take 1 capsule (100 mg total) by mouth 2 (two) times daily.   enoxaparin 40 MG/0.4ML injection Commonly known as:  LOVENOX Inject 0.4 mLs (40 mg total) into the skin daily. Use along with Coumadin to obtain therapeutic INR. May stop Lovenox once INR >/= 1.8.   EX-LAX PO Take 1 tablet by  mouth daily as needed (constipation).   ferrous sulfate 325 (65 FE) MG tablet Commonly known as:  FERROUSUL Take 1 tablet (325 mg total) by mouth 3 (three) times daily with meals.   fexofenadine 180 MG tablet Commonly known as:  ALLEGRA Take 180 mg by mouth daily.   furosemide 20 MG tablet Commonly known as:  LASIX Take 20 mg by mouth daily.   HYDROcodone-acetaminophen 7.5-325 MG tablet Commonly known as:  NORCO Take 1-2 tablets by mouth every 4 (four) hours as needed for moderate pain. What changed:  reasons to take this  Investigational - Study Medication Take 3 tablets by mouth daily. Study name: Coco and multi vitamin   levothyroxine 100 MCG tablet Commonly known as:  SYNTHROID, LEVOTHROID Take 100 mcg by mouth daily before breakfast.   lisinopril-hydrochlorothiazide 20-12.5 MG tablet Commonly known as:  PRINZIDE,ZESTORETIC Take 1 tablet by mouth daily.   methocarbamol 500 MG tablet Commonly known as:  ROBAXIN Take 1 tablet (500 mg total) by mouth every 6 (six) hours as needed for muscle spasms. What changed:    when to take this  reasons to take this   omeprazole 20 MG capsule Commonly known as:  PRILOSEC Take 20 mg by mouth daily.   polyethylene glycol packet Commonly known as:  MIRALAX / GLYCOLAX Take 17 g by mouth 2 (two) times daily.   pravastatin 40 MG tablet Commonly known as:  PRAVACHOL Take 40 mg by mouth at bedtime.   SALONPAS PAIN RELIEF PATCH EX Apply 1 patch topically daily as needed (pain).   warfarin 5 MG tablet Commonly known as:  COUMADIN Take 2.5-5 mg by mouth See admin instructions. Take 5 mg by mouth daily at night on Monday and Friday. Take 2.5 mg by mouth daily at night on all other days            Discharge Care Instructions  (From admission, onward)        Start     Ordered   02/28/18 0000  Change dressing    Comments:  Maintain surgical dressing until follow up in the clinic. If the edges start to pull up, may  reinforce with tape. If the dressing is no longer working, may remove and cover with gauze and tape, but must keep the area dry and clean.  Call with any questions or concerns.   02/28/18 0849       Signed: West Pugh. Yaasir Menken   PA-C  03/02/2018, 3:37 PM

## 2018-03-05 DIAGNOSIS — Z96651 Presence of right artificial knee joint: Secondary | ICD-10-CM | POA: Diagnosis not present

## 2018-03-05 DIAGNOSIS — Z471 Aftercare following joint replacement surgery: Secondary | ICD-10-CM | POA: Diagnosis not present

## 2018-03-07 DIAGNOSIS — M25561 Pain in right knee: Secondary | ICD-10-CM | POA: Diagnosis not present

## 2018-03-09 DIAGNOSIS — M25561 Pain in right knee: Secondary | ICD-10-CM | POA: Diagnosis not present

## 2018-03-12 DIAGNOSIS — M25561 Pain in right knee: Secondary | ICD-10-CM | POA: Diagnosis not present

## 2018-03-13 DIAGNOSIS — Z7901 Long term (current) use of anticoagulants: Secondary | ICD-10-CM | POA: Diagnosis not present

## 2018-03-13 DIAGNOSIS — R82998 Other abnormal findings in urine: Secondary | ICD-10-CM | POA: Diagnosis not present

## 2018-03-13 DIAGNOSIS — E038 Other specified hypothyroidism: Secondary | ICD-10-CM | POA: Diagnosis not present

## 2018-03-13 DIAGNOSIS — M109 Gout, unspecified: Secondary | ICD-10-CM | POA: Diagnosis not present

## 2018-03-13 DIAGNOSIS — I10 Essential (primary) hypertension: Secondary | ICD-10-CM | POA: Diagnosis not present

## 2018-03-13 DIAGNOSIS — N184 Chronic kidney disease, stage 4 (severe): Secondary | ICD-10-CM | POA: Diagnosis not present

## 2018-03-16 DIAGNOSIS — M25561 Pain in right knee: Secondary | ICD-10-CM | POA: Diagnosis not present

## 2018-03-19 DIAGNOSIS — M25561 Pain in right knee: Secondary | ICD-10-CM | POA: Diagnosis not present

## 2018-03-20 DIAGNOSIS — Z7901 Long term (current) use of anticoagulants: Secondary | ICD-10-CM | POA: Diagnosis not present

## 2018-03-20 DIAGNOSIS — I482 Chronic atrial fibrillation: Secondary | ICD-10-CM | POA: Diagnosis not present

## 2018-03-21 DIAGNOSIS — M25561 Pain in right knee: Secondary | ICD-10-CM | POA: Diagnosis not present

## 2018-03-23 DIAGNOSIS — M25561 Pain in right knee: Secondary | ICD-10-CM | POA: Diagnosis not present

## 2018-03-23 DIAGNOSIS — Z1212 Encounter for screening for malignant neoplasm of rectum: Secondary | ICD-10-CM | POA: Diagnosis not present

## 2018-03-26 DIAGNOSIS — M25561 Pain in right knee: Secondary | ICD-10-CM | POA: Diagnosis not present

## 2018-03-28 DIAGNOSIS — M25561 Pain in right knee: Secondary | ICD-10-CM | POA: Diagnosis not present

## 2018-03-29 DIAGNOSIS — I129 Hypertensive chronic kidney disease with stage 1 through stage 4 chronic kidney disease, or unspecified chronic kidney disease: Secondary | ICD-10-CM | POA: Diagnosis not present

## 2018-03-29 DIAGNOSIS — N184 Chronic kidney disease, stage 4 (severe): Secondary | ICD-10-CM | POA: Diagnosis not present

## 2018-03-29 DIAGNOSIS — Z0000 Encounter for general adult medical examination without abnormal findings: Secondary | ICD-10-CM | POA: Diagnosis not present

## 2018-03-29 DIAGNOSIS — I77810 Thoracic aortic ectasia: Secondary | ICD-10-CM | POA: Diagnosis not present

## 2018-03-29 DIAGNOSIS — Z7901 Long term (current) use of anticoagulants: Secondary | ICD-10-CM | POA: Diagnosis not present

## 2018-03-29 DIAGNOSIS — I10 Essential (primary) hypertension: Secondary | ICD-10-CM | POA: Diagnosis not present

## 2018-03-29 DIAGNOSIS — G4739 Other sleep apnea: Secondary | ICD-10-CM | POA: Diagnosis not present

## 2018-03-29 DIAGNOSIS — Z96659 Presence of unspecified artificial knee joint: Secondary | ICD-10-CM | POA: Diagnosis not present

## 2018-03-29 DIAGNOSIS — D692 Other nonthrombocytopenic purpura: Secondary | ICD-10-CM | POA: Diagnosis not present

## 2018-03-30 DIAGNOSIS — M25561 Pain in right knee: Secondary | ICD-10-CM | POA: Diagnosis not present

## 2018-04-02 DIAGNOSIS — M25561 Pain in right knee: Secondary | ICD-10-CM | POA: Diagnosis not present

## 2018-04-04 DIAGNOSIS — M25561 Pain in right knee: Secondary | ICD-10-CM | POA: Diagnosis not present

## 2018-04-05 DIAGNOSIS — Z7901 Long term (current) use of anticoagulants: Secondary | ICD-10-CM | POA: Diagnosis not present

## 2018-04-05 DIAGNOSIS — I482 Chronic atrial fibrillation: Secondary | ICD-10-CM | POA: Diagnosis not present

## 2018-04-06 DIAGNOSIS — M25561 Pain in right knee: Secondary | ICD-10-CM | POA: Diagnosis not present

## 2018-04-09 DIAGNOSIS — M25561 Pain in right knee: Secondary | ICD-10-CM | POA: Diagnosis not present

## 2018-04-12 DIAGNOSIS — Z96651 Presence of right artificial knee joint: Secondary | ICD-10-CM | POA: Diagnosis not present

## 2018-04-12 DIAGNOSIS — Z471 Aftercare following joint replacement surgery: Secondary | ICD-10-CM | POA: Diagnosis not present

## 2018-04-13 DIAGNOSIS — M25561 Pain in right knee: Secondary | ICD-10-CM | POA: Diagnosis not present

## 2018-04-16 DIAGNOSIS — M25561 Pain in right knee: Secondary | ICD-10-CM | POA: Diagnosis not present

## 2018-04-20 DIAGNOSIS — M25561 Pain in right knee: Secondary | ICD-10-CM | POA: Diagnosis not present

## 2018-04-23 DIAGNOSIS — M25561 Pain in right knee: Secondary | ICD-10-CM | POA: Diagnosis not present

## 2018-04-27 DIAGNOSIS — M25561 Pain in right knee: Secondary | ICD-10-CM | POA: Diagnosis not present

## 2018-04-30 DIAGNOSIS — M25561 Pain in right knee: Secondary | ICD-10-CM | POA: Diagnosis not present

## 2018-05-04 DIAGNOSIS — M25561 Pain in right knee: Secondary | ICD-10-CM | POA: Diagnosis not present

## 2018-05-07 DIAGNOSIS — M25561 Pain in right knee: Secondary | ICD-10-CM | POA: Diagnosis not present

## 2018-05-08 DIAGNOSIS — I482 Chronic atrial fibrillation: Secondary | ICD-10-CM | POA: Diagnosis not present

## 2018-05-08 DIAGNOSIS — Z7901 Long term (current) use of anticoagulants: Secondary | ICD-10-CM | POA: Diagnosis not present

## 2018-05-09 DIAGNOSIS — M25561 Pain in right knee: Secondary | ICD-10-CM | POA: Diagnosis not present

## 2018-05-28 DIAGNOSIS — H52223 Regular astigmatism, bilateral: Secondary | ICD-10-CM | POA: Diagnosis not present

## 2018-05-28 DIAGNOSIS — H524 Presbyopia: Secondary | ICD-10-CM | POA: Diagnosis not present

## 2018-06-07 DIAGNOSIS — Z23 Encounter for immunization: Secondary | ICD-10-CM | POA: Diagnosis not present

## 2018-06-07 DIAGNOSIS — I482 Chronic atrial fibrillation: Secondary | ICD-10-CM | POA: Diagnosis not present

## 2018-06-07 DIAGNOSIS — Z7901 Long term (current) use of anticoagulants: Secondary | ICD-10-CM | POA: Diagnosis not present

## 2018-07-17 DIAGNOSIS — Z7901 Long term (current) use of anticoagulants: Secondary | ICD-10-CM | POA: Diagnosis not present

## 2018-07-17 DIAGNOSIS — I482 Chronic atrial fibrillation, unspecified: Secondary | ICD-10-CM | POA: Diagnosis not present

## 2018-08-13 DIAGNOSIS — Z96651 Presence of right artificial knee joint: Secondary | ICD-10-CM | POA: Diagnosis not present

## 2018-08-13 DIAGNOSIS — Z471 Aftercare following joint replacement surgery: Secondary | ICD-10-CM | POA: Diagnosis not present

## 2018-08-16 DIAGNOSIS — Z7901 Long term (current) use of anticoagulants: Secondary | ICD-10-CM | POA: Diagnosis not present

## 2018-08-16 DIAGNOSIS — I482 Chronic atrial fibrillation, unspecified: Secondary | ICD-10-CM | POA: Diagnosis not present

## 2018-08-27 DIAGNOSIS — M25661 Stiffness of right knee, not elsewhere classified: Secondary | ICD-10-CM | POA: Insufficient documentation

## 2018-08-28 DIAGNOSIS — M25661 Stiffness of right knee, not elsewhere classified: Secondary | ICD-10-CM | POA: Diagnosis not present

## 2018-08-30 DIAGNOSIS — M25661 Stiffness of right knee, not elsewhere classified: Secondary | ICD-10-CM | POA: Diagnosis not present

## 2018-09-03 DIAGNOSIS — M25661 Stiffness of right knee, not elsewhere classified: Secondary | ICD-10-CM | POA: Diagnosis not present

## 2018-09-10 DIAGNOSIS — M25661 Stiffness of right knee, not elsewhere classified: Secondary | ICD-10-CM | POA: Diagnosis not present

## 2018-09-14 DIAGNOSIS — M25661 Stiffness of right knee, not elsewhere classified: Secondary | ICD-10-CM | POA: Diagnosis not present

## 2018-09-17 DIAGNOSIS — M25661 Stiffness of right knee, not elsewhere classified: Secondary | ICD-10-CM | POA: Diagnosis not present

## 2018-09-19 DIAGNOSIS — I10 Essential (primary) hypertension: Secondary | ICD-10-CM | POA: Diagnosis not present

## 2018-09-19 DIAGNOSIS — J029 Acute pharyngitis, unspecified: Secondary | ICD-10-CM | POA: Diagnosis not present

## 2018-09-19 DIAGNOSIS — I482 Chronic atrial fibrillation, unspecified: Secondary | ICD-10-CM | POA: Diagnosis not present

## 2018-09-19 DIAGNOSIS — Z6833 Body mass index (BMI) 33.0-33.9, adult: Secondary | ICD-10-CM | POA: Diagnosis not present

## 2018-09-19 DIAGNOSIS — J069 Acute upper respiratory infection, unspecified: Secondary | ICD-10-CM | POA: Diagnosis not present

## 2018-09-20 DIAGNOSIS — Z7901 Long term (current) use of anticoagulants: Secondary | ICD-10-CM | POA: Diagnosis not present

## 2018-09-20 DIAGNOSIS — I482 Chronic atrial fibrillation, unspecified: Secondary | ICD-10-CM | POA: Diagnosis not present

## 2018-09-22 NOTE — Progress Notes (Signed)
[name] [name] Date of Birth: Mar 02, 1946   History of Present Illness: [name] [name] is seen for  followup of atrial fibrillation. She has a history of permanent atrial fibrillation treated with  anticoagulation with Coumadin. No need for rate control therapy since HR has been normal. Prior Holter monitor showed average HR 72. Range 38-139. Longest pause 2.5 sec. She was seen in January 2019 for clearance for knee surgery and was doing well.   In August 2017 she had ERCP for common duct stone. In November 2017 she had colonoscopy with findings of three polyps. In June 2019 she had left TKR then in June 2019 right TKR.  On follow up today she is doing quite well. No chest pain, dyspnea or palpitations. No dizziness. States coumadin checks have been "perfect".  Has a ganglion cyst on her foot she is going to see about.   Current Outpatient Medications on File Prior to Visit  Medication Sig Dispense Refill  . allopurinol (ZYLOPRIM) 300 MG tablet Take 300 mg by mouth daily.     Marland Kitchen COENZYME Q10 PO Take 1 capsule by mouth daily.     . fexofenadine (ALLEGRA) 180 MG tablet Take 180 mg by mouth daily.     . furosemide (LASIX) 20 MG tablet Take 20 mg by mouth daily.     . Investigational - Study Medication Take 3 tablets by mouth daily. Study name: Coco and multi vitamin    . levothyroxine (SYNTHROID, LEVOTHROID) 100 MCG tablet Take 100 mcg by mouth daily before breakfast.    . Liniments (SALONPAS PAIN RELIEF PATCH EX) Apply 1 patch topically daily as needed (pain).    Marland Kitchen lisinopril-hydrochlorothiazide (PRINZIDE,ZESTORETIC) 20-12.5 MG per tablet Take 1 tablet by mouth daily.     . methocarbamol (ROBAXIN) 500 MG tablet Take 1 tablet (500 mg total) by mouth every 6 (six) hours as needed for muscle spasms. 40 tablet 0  . omeprazole (PRILOSEC) 20 MG capsule Take 20 mg by mouth daily.     . pravastatin (PRAVACHOL) 40 MG tablet Take 40 mg by mouth at bedtime.     . Sennosides (EX-LAX PO) Take 1 tablet by mouth  daily as needed (constipation).    . warfarin (COUMADIN) 5 MG tablet Take 2.5-5 mg by mouth See admin instructions. Take 5 mg by mouth daily at night on Monday and Friday. Take 2.5 mg by mouth daily at night on all other days     No current facility-administered medications on file prior to visit.     Allergies  Allergen Reactions  . Adhesive [Tape] Other (See Comments)    Tears skin  . Celebrex [Celecoxib] Other (See Comments)    Causes nose bleeds  . Nsaids Other (See Comments)    Causes afib    Past Medical History:  Diagnosis Date  . AF (atrial fibrillation) ([place])   . Arthritis    OA  . Cholelithiasis   . Colon polyps   . Diverticulosis   . DJD (degenerative joint disease)   . Dyslipidemia   . Fatty liver disease, nonalcoholic   . GERD (gastroesophageal reflux disease)   . Gout   . Hepatitis A AGE 69  . History of hiatal hernia    PT THINKS  . Hypertension   . Hypothyroidism   . Obesity     Past Surgical History:  Procedure Laterality Date  . BREAST BIOPSY Bilateral    1 right, 2 left  . CHOLECYSTECTOMY  22 YRS AGO  . CYST  ON CERVIX REMOVED  1965  . ERCP  20 YRS AGO  . ERCP N/A 04/12/2016   Procedure: ENDOSCOPIC RETROGRADE CHOLANGIOPANCREATOGRAPHY (ERCP);  Surgeon: Irene Shipper, MD;  Location: [facility];  Service: Endoscopy;  Laterality: N/A;  . EYE SURGERY Bilateral 2015   LASER SURGERY DONE ALSO BOTH EYES FOR FILM OVER EYES  . KNEE ARTHROSCOPY 20 YRS AGO Left   . PLANTAR FASCIA SURGERY Left   . TOENAIL EXCISION Bilateral   . TONSILLECTOMY    . TOTAL ABDOMINAL HYSTERECTOMY     with appendectomy, COMPLETE  . TOTAL KNEE ARTHROPLASTY Left 09/25/2017   Procedure: LEFT TOTAL KNEE ARTHROPLASTY;  Surgeon: Paralee Cancel, MD;  Location: [facility];  Service: Orthopedics;  Laterality: Left;  90 mins  . TOTAL KNEE ARTHROPLASTY Right 02/27/2018   Procedure: RIGHT TOTAL KNEE ARTHROPLASTY;  Surgeon: Paralee Cancel, MD;  Location: [facility];  Service: Orthopedics;  Laterality:  Right;  Adductor Block    Social History   Tobacco Use  Smoking Status Former Smoker  . Packs/day: 1.00  . Years: 25.00  . Pack years: 25.00  . Types: Cigarettes  . Last attempt to quit: 09/12/1998  . Years since quitting: 20.0  Smokeless Tobacco Never Used    Social History   Substance and Sexual Activity  Alcohol Use No  . Alcohol/week: 0.0 standard drinks    Family History  Problem Relation Age of Onset  . Diabetes Mother   . Stroke Mother   . Heart disease Father   . Diabetes Father   . Arrhythmia Sister   . Heart disease Sister        x 2  . Breast cancer Sister        x 2  . Diabetes Sister        x 2    Review of Systems: As noted in history of present illness. All other systems were reviewed and are negative.  Physical Exam: BP 118/60   Pulse 66   Ht 5\' 4"  (1.626 m)   Wt 207 lb (93.9 kg)   BMI 35.53 kg/m  GENERAL:  Well appearing WF in NAD HEENT:  PERRL, EOMI, sclera are clear. Oropharynx is clear. NECK:  No jugular venous distention, carotid upstroke brisk and symmetric, no bruits, no thyromegaly or adenopathy LUNGS:  Clear to auscultation bilaterally CHEST:  Unremarkable HEART:  IRRR,  PMI not displaced or sustained,S1 and S2 within normal limits, no S3, no S4: no clicks, no rubs, no murmurs ABD:  Soft, nontender. BS +, no masses or bruits. No hepatomegaly, no splenomegaly EXT:  2 + pulses throughout, no edema, no cyanosis no clubbing SKIN:  Warm and dry.  No rashes NEURO:  Alert and oriented x 3. Cranial nerves II through XII intact. PSYCH:  Cognitively intact    LABORATORY DATA:  Lab Results  Component Value Date   WBC 15.2 (H) 02/28/2018   HGB 9.5 (L) 02/28/2018   HCT 28.5 (L) 02/28/2018   PLT 163 02/28/2018   GLUCOSE 146 (H) 02/28/2018   ALT 30 02/22/2018   AST 31 02/22/2018   NA 141 02/28/2018   K 4.7 02/28/2018   CL 109 02/28/2018   CREATININE 1.98 (H) 02/28/2018   BUN 72 (H) 02/28/2018   CO2 23 02/28/2018   INR 1.20  02/28/2018   Labs dated 03/03/16: cholesterol 154, triglycerides 72, LDL 86, HDL 54.  Dated 09/23/16: BUN 59, creatinine 1.5. CMET, TSH, CBC normal.  Dated 03/13/18: cholesterol 141, triglycerides 59, HDL 49,  LDL 80. Hgb 10, creatinine 1.6. ALT and TSH normal.  Ecg today shows Afib with rate 66, low voltage. Nonspecific ST-T changes. I have personally reviewed and interpreted this study.   Assessment / Plan: 1. Atrial fibrillation. Rate is well controlled on no medication. She is on anticoagulation with Coumadin.  Mali vasc score of 3. She is asymptomatic. I will follow up in one year.  2. Morbid obesity. Encouraged continued weight loss.   3. Hypercholesterolemia-on pravastatin. Controlled.

## 2018-09-24 ENCOUNTER — Encounter: Payer: Self-pay | Admitting: Cardiology

## 2018-09-24 ENCOUNTER — Ambulatory Visit: Payer: Medicare HMO | Admitting: Cardiology

## 2018-09-24 VITALS — BP 118/60 | HR 66 | Ht 64.0 in | Wt 207.0 lb

## 2018-09-24 DIAGNOSIS — E7800 Pure hypercholesterolemia, unspecified: Secondary | ICD-10-CM

## 2018-09-24 DIAGNOSIS — I10 Essential (primary) hypertension: Secondary | ICD-10-CM

## 2018-09-24 DIAGNOSIS — I4821 Permanent atrial fibrillation: Secondary | ICD-10-CM

## 2018-09-25 DIAGNOSIS — I4821 Permanent atrial fibrillation: Secondary | ICD-10-CM | POA: Diagnosis not present

## 2018-09-25 DIAGNOSIS — N184 Chronic kidney disease, stage 4 (severe): Secondary | ICD-10-CM | POA: Diagnosis not present

## 2018-09-25 DIAGNOSIS — I129 Hypertensive chronic kidney disease with stage 1 through stage 4 chronic kidney disease, or unspecified chronic kidney disease: Secondary | ICD-10-CM | POA: Diagnosis not present

## 2018-09-25 DIAGNOSIS — G4739 Other sleep apnea: Secondary | ICD-10-CM | POA: Diagnosis not present

## 2018-09-25 DIAGNOSIS — I77810 Thoracic aortic ectasia: Secondary | ICD-10-CM | POA: Diagnosis not present

## 2018-09-25 DIAGNOSIS — E038 Other specified hypothyroidism: Secondary | ICD-10-CM | POA: Diagnosis not present

## 2018-09-25 DIAGNOSIS — M25661 Stiffness of right knee, not elsewhere classified: Secondary | ICD-10-CM | POA: Diagnosis not present

## 2018-09-25 DIAGNOSIS — Z7901 Long term (current) use of anticoagulants: Secondary | ICD-10-CM | POA: Diagnosis not present

## 2018-09-25 DIAGNOSIS — D692 Other nonthrombocytopenic purpura: Secondary | ICD-10-CM | POA: Diagnosis not present

## 2018-09-25 DIAGNOSIS — E668 Other obesity: Secondary | ICD-10-CM | POA: Diagnosis not present

## 2018-09-26 DIAGNOSIS — M25561 Pain in right knee: Secondary | ICD-10-CM | POA: Diagnosis not present

## 2018-09-28 DIAGNOSIS — Z7901 Long term (current) use of anticoagulants: Secondary | ICD-10-CM | POA: Diagnosis not present

## 2018-09-28 DIAGNOSIS — I482 Chronic atrial fibrillation, unspecified: Secondary | ICD-10-CM | POA: Diagnosis not present

## 2018-10-04 DIAGNOSIS — J069 Acute upper respiratory infection, unspecified: Secondary | ICD-10-CM | POA: Diagnosis not present

## 2018-10-04 DIAGNOSIS — Z7901 Long term (current) use of anticoagulants: Secondary | ICD-10-CM | POA: Diagnosis not present

## 2018-10-04 DIAGNOSIS — I482 Chronic atrial fibrillation, unspecified: Secondary | ICD-10-CM | POA: Diagnosis not present

## 2018-10-04 DIAGNOSIS — R05 Cough: Secondary | ICD-10-CM | POA: Diagnosis not present

## 2018-10-09 ENCOUNTER — Ambulatory Visit: Payer: Medicare HMO | Admitting: Sports Medicine

## 2018-10-09 ENCOUNTER — Ambulatory Visit (INDEPENDENT_AMBULATORY_CARE_PROVIDER_SITE_OTHER): Payer: Medicare HMO

## 2018-10-09 ENCOUNTER — Encounter: Payer: Self-pay | Admitting: Sports Medicine

## 2018-10-09 ENCOUNTER — Other Ambulatory Visit: Payer: Self-pay | Admitting: Sports Medicine

## 2018-10-09 VITALS — BP 126/63 | HR 69

## 2018-10-09 DIAGNOSIS — M19072 Primary osteoarthritis, left ankle and foot: Secondary | ICD-10-CM

## 2018-10-09 DIAGNOSIS — M779 Enthesopathy, unspecified: Secondary | ICD-10-CM | POA: Diagnosis not present

## 2018-10-09 DIAGNOSIS — M79672 Pain in left foot: Secondary | ICD-10-CM

## 2018-10-09 NOTE — Progress Notes (Signed)
Subjective: [name] [name] is a 73 y.o. female patient who presents to office for evaluation of Left foot pain. Patient complains of progressive pain especially over the last few weeks that is now better. Reports she had an episode of swelling that she thought was a cyst with pain with walking that since making appointment is gone. Patient denies any other pedal complaints. Denies injury/trip/fall/sprain/any causative factors.   Review of Systems  Musculoskeletal: Positive for joint pain.  All other systems reviewed and are negative.    Patient Active Problem List   Diagnosis Date Noted  . Stiffness of right knee 08/27/2018  . S/P right TKA 02/27/2018  . Pain in right knee 01/22/2018  . S/P left TKA 09/25/2017  . S/P total knee replacement 09/25/2017  . Bile duct stone   . Hypertension   . Dyslipidemia   . Obesity   . GERD (gastroesophageal reflux disease)   . AF (atrial fibrillation) ([place])   . Hypercholesterolemia     Current Outpatient Medications on File Prior to Visit  Medication Sig Dispense Refill  . allopurinol (ZYLOPRIM) 300 MG tablet Take 300 mg by mouth daily.     Marland Kitchen amoxicillin (AMOXIL) 875 MG tablet amoxicillin 875 mg tablet    . Azelastine HCl 0.15 % SOLN     . benzonatate (TESSALON) 200 MG capsule TK 1 C PO TID PRF COUGH    . COENZYME Q10 PO Take 1 capsule by mouth daily.     . fexofenadine (ALLEGRA) 180 MG tablet Take 180 mg by mouth daily.     . fluticasone (FLONASE) 50 MCG/ACT nasal spray SHAKE LQ AND U 2 SPRAYS IEN D    . furosemide (LASIX) 20 MG tablet Take 20 mg by mouth daily.     . Investigational - Study Medication Take 3 tablets by mouth daily. Study name: Coco and multi vitamin    . levothyroxine (SYNTHROID, LEVOTHROID) 100 MCG tablet Take 100 mcg by mouth daily before breakfast.    . Liniments (SALONPAS PAIN RELIEF PATCH EX) Apply 1 patch topically daily as needed (pain).    Marland Kitchen lisinopril-hydrochlorothiazide (PRINZIDE,ZESTORETIC) 20-12.5 MG per tablet Take  1 tablet by mouth daily.     . methocarbamol (ROBAXIN) 500 MG tablet Take 1 tablet (500 mg total) by mouth every 6 (six) hours as needed for muscle spasms. 40 tablet 0  . omeprazole (PRILOSEC) 20 MG capsule Take 20 mg by mouth daily.     . pravastatin (PRAVACHOL) 40 MG tablet Take 40 mg by mouth at bedtime.     . predniSONE (DELTASONE) 5 MG tablet TK 6 TS PO ON DAY 1 AND TAPER BY 1 T OVER 6 DAYS.    . Sennosides (EX-LAX PO) Take 1 tablet by mouth daily as needed (constipation).    . traMADol (ULTRAM) 50 MG tablet tramadol 50 mg tablet    . warfarin (COUMADIN) 5 MG tablet Take 2.5-5 mg by mouth See admin instructions. Take 5 mg by mouth daily at night on Monday and Friday. Take 2.5 mg by mouth daily at night on all other days     No current facility-administered medications on file prior to visit.     Allergies  Allergen Reactions  . Adhesive [Tape] Other (See Comments)    Tears skin  . Celebrex [Celecoxib] Other (See Comments)    Causes nose bleeds  . Nsaids Other (See Comments)    Causes afib    Objective:  General: Alert and oriented x3 in no acute distress  Dermatology:No obvious cyst to top of left foot but palpable bone spur to midfoot, No open lesions bilateral lower extremities, no webspace macerations, no ecchymosis bilateral, all nails x 10 are well manicured.  Vascular: Dorsalis Pedis and Posterior Tibial pedal pulses palpable, Capillary Fill Time 3 seconds,(+) pedal hair growth bilateral, no edema bilateral lower extremities, Temperature gradient within normal limits.  Neurology: Johney Maine sensation intact via light touch bilateral.  Musculoskeletal: Mild tenderness with palpation at bone spur left dorsal medial midfoot supportive of arthritis,No pain with range of motion with limited midfoot ROM on left. Strength within normal limits in all groups bilateral.   Gait: Non Antalgic gait  Xrays  Left Foot   Impression:midfoot arthritis, no fracture, no dislocation, no other  acute findings.   Assessment and Plan: Problem List Items Addressed This Visit    None    Visit Diagnoses    Pain in left foot    -  Primary   Relevant Orders   DG Foot Complete Left   Arthritis of left foot       Relevant Medications   predniSONE (DELTASONE) 5 MG tablet   traMADol (ULTRAM) 50 MG tablet   Capsulitis           -Complete examination performed -Xrays reviewed -Discussed treatement options for arthritis -Patient declined injection since symptoms are better at this time -Recommend PRN topical OTC pain cream and good supportive shoes -Continue with custom insoles  -Patient to return to office as needed or sooner if condition worsens.  Landis Martins, DPM

## 2018-10-16 DIAGNOSIS — R040 Epistaxis: Secondary | ICD-10-CM | POA: Diagnosis not present

## 2018-10-16 DIAGNOSIS — K5909 Other constipation: Secondary | ICD-10-CM | POA: Diagnosis not present

## 2018-10-16 DIAGNOSIS — K5900 Constipation, unspecified: Secondary | ICD-10-CM | POA: Insufficient documentation

## 2018-10-16 DIAGNOSIS — Z7901 Long term (current) use of anticoagulants: Secondary | ICD-10-CM | POA: Diagnosis not present

## 2018-10-16 DIAGNOSIS — J069 Acute upper respiratory infection, unspecified: Secondary | ICD-10-CM | POA: Diagnosis not present

## 2018-10-25 DIAGNOSIS — I482 Chronic atrial fibrillation, unspecified: Secondary | ICD-10-CM | POA: Diagnosis not present

## 2018-10-25 DIAGNOSIS — Z7901 Long term (current) use of anticoagulants: Secondary | ICD-10-CM | POA: Diagnosis not present

## 2018-11-27 DIAGNOSIS — I4821 Permanent atrial fibrillation: Secondary | ICD-10-CM | POA: Diagnosis not present

## 2018-11-27 DIAGNOSIS — Z7901 Long term (current) use of anticoagulants: Secondary | ICD-10-CM | POA: Diagnosis not present

## 2018-12-03 DIAGNOSIS — L814 Other melanin hyperpigmentation: Secondary | ICD-10-CM | POA: Diagnosis not present

## 2018-12-03 DIAGNOSIS — D225 Melanocytic nevi of trunk: Secondary | ICD-10-CM | POA: Diagnosis not present

## 2018-12-03 DIAGNOSIS — L821 Other seborrheic keratosis: Secondary | ICD-10-CM | POA: Diagnosis not present

## 2018-12-03 DIAGNOSIS — L900 Lichen sclerosus et atrophicus: Secondary | ICD-10-CM | POA: Diagnosis not present

## 2018-12-24 ENCOUNTER — Telehealth: Payer: Self-pay | Admitting: Cardiology

## 2018-12-24 DIAGNOSIS — N184 Chronic kidney disease, stage 4 (severe): Secondary | ICD-10-CM | POA: Diagnosis not present

## 2018-12-24 DIAGNOSIS — I129 Hypertensive chronic kidney disease with stage 1 through stage 4 chronic kidney disease, or unspecified chronic kidney disease: Secondary | ICD-10-CM | POA: Diagnosis not present

## 2018-12-24 DIAGNOSIS — R55 Syncope and collapse: Secondary | ICD-10-CM | POA: Diagnosis not present

## 2018-12-24 DIAGNOSIS — Z7901 Long term (current) use of anticoagulants: Secondary | ICD-10-CM | POA: Diagnosis not present

## 2018-12-24 DIAGNOSIS — I4821 Permanent atrial fibrillation: Secondary | ICD-10-CM | POA: Diagnosis not present

## 2018-12-24 NOTE — Telephone Encounter (Signed)
Noted  

## 2018-12-24 NOTE — Telephone Encounter (Signed)
Smart phone/MyChart/pre reg complete. 12-24-18 ST Patient consents to virtual visit. 12-24-18 ST

## 2018-12-24 NOTE — Telephone Encounter (Signed)
New Message:    Pt needs to have an appt tomorrow per Lindsay,NP for Dr Osborne Casco at [phone]. Pt can see Dr Martinique or an APP.  I put pt on Luke's schedule for tomorrow.

## 2018-12-25 ENCOUNTER — Encounter: Payer: Self-pay | Admitting: Internal Medicine

## 2018-12-25 ENCOUNTER — Ambulatory Visit (INDEPENDENT_AMBULATORY_CARE_PROVIDER_SITE_OTHER): Payer: Medicare HMO | Admitting: Internal Medicine

## 2018-12-25 ENCOUNTER — Other Ambulatory Visit: Payer: Self-pay

## 2018-12-25 ENCOUNTER — Telehealth (INDEPENDENT_AMBULATORY_CARE_PROVIDER_SITE_OTHER): Payer: Medicare HMO | Admitting: Cardiology

## 2018-12-25 ENCOUNTER — Encounter: Payer: Self-pay | Admitting: Cardiology

## 2018-12-25 VITALS — HR 89 | Temp 96.3°F | Ht 65.0 in | Wt 200.0 lb

## 2018-12-25 VITALS — BP 82/48 | Resp 12 | Ht 65.0 in | Wt 201.9 lb

## 2018-12-25 DIAGNOSIS — I9589 Other hypotension: Secondary | ICD-10-CM

## 2018-12-25 DIAGNOSIS — I10 Essential (primary) hypertension: Secondary | ICD-10-CM | POA: Diagnosis not present

## 2018-12-25 DIAGNOSIS — I4821 Permanent atrial fibrillation: Secondary | ICD-10-CM | POA: Diagnosis not present

## 2018-12-25 DIAGNOSIS — I129 Hypertensive chronic kidney disease with stage 1 through stage 4 chronic kidney disease, or unspecified chronic kidney disease: Secondary | ICD-10-CM | POA: Diagnosis not present

## 2018-12-25 DIAGNOSIS — N184 Chronic kidney disease, stage 4 (severe): Secondary | ICD-10-CM | POA: Diagnosis not present

## 2018-12-25 DIAGNOSIS — I951 Orthostatic hypotension: Secondary | ICD-10-CM | POA: Diagnosis not present

## 2018-12-25 DIAGNOSIS — R55 Syncope and collapse: Secondary | ICD-10-CM

## 2018-12-25 DIAGNOSIS — I482 Chronic atrial fibrillation, unspecified: Secondary | ICD-10-CM | POA: Diagnosis not present

## 2018-12-25 DIAGNOSIS — Z7901 Long term (current) use of anticoagulants: Secondary | ICD-10-CM

## 2018-12-25 DIAGNOSIS — E7800 Pure hypercholesterolemia, unspecified: Secondary | ICD-10-CM

## 2018-12-25 DIAGNOSIS — E785 Hyperlipidemia, unspecified: Secondary | ICD-10-CM

## 2018-12-25 LAB — POCT INR: INR: 1.7 — AB (ref 2.0–3.0)

## 2018-12-25 NOTE — Patient Instructions (Signed)
Medication Instructions:  Stop: Lisinopril-Hydrochlorothiazide 20-25 mg  If you need a refill on your cardiac medications before your next appointment, please call your pharmacy.   Lab work: None   Testing/Procedures: None  Follow-Up: Your physician recommends that you schedule a follow-up appointment with Kerin Ransom, PA in 1 month.

## 2018-12-25 NOTE — Progress Notes (Signed)
OFFICE NOTE  Chief Complaint:  Recurrent syncope  Primary Care Physician: Tisovec, Fransico Him, MD  HPI:  [name] [name] is a 73 y.o. female patient of Dr. Martinique, seen by me in the office today as the DOD. She has past medial history significant for atrial fibrillation, hypertension, hypothyroidism, and dyslipidemia.  In the past she was noted to have A. fib with variable ventricular response with heart rates between the 40s and 120s and some short pauses up to 2.5 seconds.  She is not currently on any rate control although her heart rate is well controlled, suggesting maybe some conduction delay.  On Sunday and on Monday she had 2 syncopal episodes.  Both of which had no significant prodrome other than a very short period of dizziness prior to the episode.  She does feel positionally dizzy as well.  She had an ED visit with her PCP yesterday and lab work was ordered.  Unfortunately she did not have a blood pressure taken as she does not own a blood pressure cuff.  She also had an ED visit today with Kerin Ransom, PA-C, who felt that she would benefit from an in office visit and referred her to the office today.  EKG shows atrial fibrillation with controlled ventricular response no evidence of ischemia, pauses or other arrhythmias.  Her blood pressure however was markedly low at 82/48.  She did have some mild orthostatic symptoms.  She takes lisinopril HCTZ for blood pressure.  He denies chest pain or shortness of breath, cough, fever or other COVID-19 symptoms.  PMHx:  Past Medical History:  Diagnosis Date  . AF (atrial fibrillation) ([place])   . Arthritis    OA  . Cholelithiasis   . Colon polyps   . Diverticulosis   . DJD (degenerative joint disease)   . Dyslipidemia   . Fatty liver disease, nonalcoholic   . GERD (gastroesophageal reflux disease)   . Gout   . Hepatitis A AGE 20  . History of hiatal hernia    PT THINKS  . Hypertension   . Hypothyroidism   . Obesity     Past Surgical  History:  Procedure Laterality Date  . BREAST BIOPSY Bilateral    1 right, 2 left  . CHOLECYSTECTOMY  22 YRS AGO  . CYST ON CERVIX REMOVED  1965  . ERCP  20 YRS AGO  . ERCP N/A 04/12/2016   Procedure: ENDOSCOPIC RETROGRADE CHOLANGIOPANCREATOGRAPHY (ERCP);  Surgeon: Irene Shipper, MD;  Location: [facility];  Service: Endoscopy;  Laterality: N/A;  . EYE SURGERY Bilateral 2015   LASER SURGERY DONE ALSO BOTH EYES FOR FILM OVER EYES  . KNEE ARTHROSCOPY 20 YRS AGO Left   . PLANTAR FASCIA SURGERY Left   . TOENAIL EXCISION Bilateral   . TONSILLECTOMY    . TOTAL ABDOMINAL HYSTERECTOMY     with appendectomy, COMPLETE  . TOTAL KNEE ARTHROPLASTY Left 09/25/2017   Procedure: LEFT TOTAL KNEE ARTHROPLASTY;  Surgeon: Paralee Cancel, MD;  Location: [facility];  Service: Orthopedics;  Laterality: Left;  90 mins  . TOTAL KNEE ARTHROPLASTY Right 02/27/2018   Procedure: RIGHT TOTAL KNEE ARTHROPLASTY;  Surgeon: Paralee Cancel, MD;  Location: [facility];  Service: Orthopedics;  Laterality: Right;  Adductor Block    FAMHx:  Family History  Problem Relation Age of Onset  . Diabetes Mother   . Stroke Mother   . Heart disease Father   . Diabetes Father   . Arrhythmia Sister   . Heart disease Sister  x 2  . Breast cancer Sister        x 2  . Diabetes Sister        x 2    SOCHx:   reports that she quit smoking about 20 years ago. Her smoking use included cigarettes. She has a 25.00 pack-year smoking history. She has never used smokeless tobacco. She reports that she does not drink alcohol or use drugs.  ALLERGIES:  Allergies  Allergen Reactions  . Adhesive [Tape] Other (See Comments)    Tears skin  . Celebrex [Celecoxib] Other (See Comments)    Causes nose bleeds  . Nsaids Other (See Comments)    Causes afib    ROS: Pertinent items noted in HPI and remainder of comprehensive ROS otherwise negative.  HOME MEDS: Current Outpatient Medications on File Prior to Visit  Medication Sig Dispense Refill   . allopurinol (ZYLOPRIM) 300 MG tablet Take 300 mg by mouth daily.     Marland Kitchen COENZYME Q10 PO Take 1 capsule by mouth daily.     . fexofenadine (ALLEGRA) 180 MG tablet Take 180 mg by mouth daily.     . furosemide (LASIX) 20 MG tablet Take 20 mg by mouth daily.     . Investigational - Study Medication Take 3 tablets by mouth daily. Study name: Coco and multi vitamin    . levothyroxine (SYNTHROID, LEVOTHROID) 100 MCG tablet Take 100 mcg by mouth daily before breakfast.    . Liniments (SALONPAS PAIN RELIEF PATCH EX) Apply 1 patch topically daily as needed (pain).    Marland Kitchen LINZESS 72 MCG capsule Take 1 capsule by mouth daily.    . methocarbamol (ROBAXIN) 500 MG tablet Take 1 tablet (500 mg total) by mouth every 6 (six) hours as needed for muscle spasms. 40 tablet 0  . omeprazole (PRILOSEC) 20 MG capsule Take 20 mg by mouth daily.     . pravastatin (PRAVACHOL) 40 MG tablet Take 40 mg by mouth at bedtime.     . traMADol (ULTRAM) 50 MG tablet tramadol 50 mg tablet    . warfarin (COUMADIN) 5 MG tablet Take 2.5-5 mg by mouth See admin instructions. Take 5 mg by mouth daily at night on Monday and Friday. Take 2.5 mg by mouth daily at night on all other days     No current facility-administered medications on file prior to visit.     LABS/IMAGING: No results found for this or any previous visit (from the past 48 hour(s)). No results found.  LIPID PANEL: No results found for: CHOL, TRIG, HDL, CHOLHDL, VLDL, LDLCALC, LDLDIRECT   WEIGHTS: Wt Readings from Last 3 Encounters:  12/25/18 201 lb 14.4 oz (91.6 kg)  12/25/18 200 lb (90.7 kg)  09/24/18 207 lb (93.9 kg)    VITALS: BP (!) 82/48 (BP Location: Left Arm, Patient Position: Sitting, Cuff Size: Large)   Resp 12   Ht 5\' 5"  (1.651 m)   Wt 201 lb 14.4 oz (91.6 kg)   BMI 33.60 kg/m   EXAM: General appearance: alert and no distress Neck: no carotid bruit, no JVD and thyroid not enlarged, symmetric, no tenderness/mass/nodules Lungs: clear to  auscultation bilaterally Heart: irregularly irregular rhythm Abdomen: soft, non-tender; bowel sounds normal; no masses,  no organomegaly and obese Extremities: extremities normal, atraumatic, no cyanosis or edema Pulses: 2+ and symmetric Skin: Skin color, texture, turgor normal. No rashes or lesions Neurologic: Grossly normal Psych: Pleasant  EKG: AFib with CVR - personally reviewed  ASSESSMENT: 1. Orthostatic syncope 2. Hypotension  3. Afib with CVR  PLAN: 1.   Mrs. Kath is describing orthostatic syncope likely related to hypotension.  She has had about 6 pound weight loss since January however not sure that is the reason why she is all of a sudden hypotensive.  Either way, I discontinued her lisinopril HCTZ today.  I encouraged her highly to purchase a blood pressure cuff and monitor blood pressures at home.  She should contact us with those results and will schedule for a follow-up visit in a month.  Pixie Casino, MD, [facility], [place] Director of the [facility]  [facility] Diplomate of the American Board of Clinical Lipidology Attending Cardiologist  Direct Dial: [phone]  Fax: [phone]  Website:  [email or web address] 12/25/2018, 4:14 PM

## 2018-12-25 NOTE — Progress Notes (Signed)
Virtual Visit via Telephone Note   This visit type was conducted due to national recommendations for restrictions regarding the COVID-19 Pandemic (e.g. social distancing) in an effort to limit this patient's exposure and mitigate transmission in our community.  Due to her co-morbid illnesses, this patient is at least at moderate risk for complications without adequate follow up.  This format is felt to be most appropriate for this patient at this time.  The patient did not have access to video technology/had technical difficulties with video requiring transitioning to audio format only (telephone).  All issues noted in this document were discussed and addressed.  No physical exam could be performed with this format.  Please refer to the patient's chart for her  consent to telehealth for [facility].  Evaluation Performed:  Follow-up visit  This visit type was conducted due to national recommendations for restrictions regarding the COVID-19 Pandemic (e.g. social distancing).  This format is felt to be most appropriate for this patient at this time.  All issues noted in this document were discussed and addressed.  No physical exam was performed (except for noted visual exam findings with Video Visits).  Please refer to the patient's chart (MyChart message for video visits and phone note for telephone visits) for the patient's consent to telehealth for [facility].  Date:  12/25/2018   ID:  [name] [name], DOB [birth year], MRN [id number]  Patient Location: [place] [address]   Provider location:   [facility] [place]  PCP:  Tisovec, Fransico Him, MD  Cardiologist:  Dr Martinique Electrophysiologist:  None   Chief Complaint:  Syncope  History of Present Illness:    [name] [name] is a 73 y.o. female who presents via audio/video conferencing for a telehealth visit today.  The patient is a pleasant 73 year old female who is been followed by Dr. Martinique for some years.  She has a  history of permanent atrial fibrillation.  She has had controlled ventricular response on no medications.  Prior Holter monitor has shown rates from 40-138.  She did have a 2.5-second pause documented in the past.  Her last echocardiogram was in 2010.  She saw Dr. Martinique in January and was doing well.  She is on chronic Coumadin and followed by her primary care provider.  She also has significant renal insufficiency and some anemia.  Her creatinine in June 2019 was 1.98 with a GFR in the 20s.  Her hemoglobin then was 9.5.  She apparently contacted office after an episode of syncope.  She tells me that Sunday she was getting ready to go get the paper when she ended up on the floor.  She does not remember having a lot of warning prior to that episode.  She called out to her family and they helped her up.  Monday she was sitting at the kitchen table and became dizzy.  She does not remember becoming diaphoretic or having chest pain.  She felt like she was going to have to go lay down but the next thing she knew she was on the floor, she had fallen out of the chair.  EMS was contacted and helped her up but she was not transported to the hospital.  Today she says she feels fine.  Her husband has COPD and has a pulse ox at home.  She checked her heart rate based on the pulse ox and it was in the 80s.  She is always been unaware of her atrial fibrillation.  She did not indicate that she was injured in either event.  After discussion with Dr. Debara Pickett we felt it was best to send her over to the office for an EKG and vital signs and examination.  She tells me she had labs drawn today apparently ordered by her primary care provider, I do not have those records available to me.  The patient does not symptoms concerning for COVID-19 infection (fever, chills, cough, or new SHORTNESS OF BREATH).    Prior CV studies:   The following studies were reviewed today:   Past Medical History:  Diagnosis Date  . AF (atrial  fibrillation) ([place])   . Arthritis    OA  . Cholelithiasis   . Colon polyps   . Diverticulosis   . DJD (degenerative joint disease)   . Dyslipidemia   . Fatty liver disease, nonalcoholic   . GERD (gastroesophageal reflux disease)   . Gout   . Hepatitis A AGE 83  . History of hiatal hernia    PT THINKS  . Hypertension   . Hypothyroidism   . Obesity    Past Surgical History:  Procedure Laterality Date  . BREAST BIOPSY Bilateral    1 right, 2 left  . CHOLECYSTECTOMY  22 YRS AGO  . CYST ON CERVIX REMOVED  1965  . ERCP  20 YRS AGO  . ERCP N/A 04/12/2016   Procedure: ENDOSCOPIC RETROGRADE CHOLANGIOPANCREATOGRAPHY (ERCP);  Surgeon: Irene Shipper, MD;  Location: [facility];  Service: Endoscopy;  Laterality: N/A;  . EYE SURGERY Bilateral 2015   LASER SURGERY DONE ALSO BOTH EYES FOR FILM OVER EYES  . KNEE ARTHROSCOPY 20 YRS AGO Left   . PLANTAR FASCIA SURGERY Left   . TOENAIL EXCISION Bilateral   . TONSILLECTOMY    . TOTAL ABDOMINAL HYSTERECTOMY     with appendectomy, COMPLETE  . TOTAL KNEE ARTHROPLASTY Left 09/25/2017   Procedure: LEFT TOTAL KNEE ARTHROPLASTY;  Surgeon: Paralee Cancel, MD;  Location: [facility];  Service: Orthopedics;  Laterality: Left;  90 mins  . TOTAL KNEE ARTHROPLASTY Right 02/27/2018   Procedure: RIGHT TOTAL KNEE ARTHROPLASTY;  Surgeon: Paralee Cancel, MD;  Location: [facility];  Service: Orthopedics;  Laterality: Right;  Adductor Block     Current Meds  Medication Sig  . allopurinol (ZYLOPRIM) 300 MG tablet Take 300 mg by mouth daily.   Marland Kitchen COENZYME Q10 PO Take 1 capsule by mouth daily.   . fexofenadine (ALLEGRA) 180 MG tablet Take 180 mg by mouth daily.   . furosemide (LASIX) 20 MG tablet Take 20 mg by mouth daily.   . Investigational - Study Medication Take 3 tablets by mouth daily. Study name: Coco and multi vitamin  . levothyroxine (SYNTHROID, LEVOTHROID) 100 MCG tablet Take 100 mcg by mouth daily before breakfast.  . Liniments (SALONPAS PAIN RELIEF PATCH EX) Apply  1 patch topically daily as needed (pain).  Marland Kitchen LINZESS 72 MCG capsule Take 1 capsule by mouth daily.  Marland Kitchen lisinopril-hydrochlorothiazide (PRINZIDE,ZESTORETIC) 20-12.5 MG per tablet Take 1 tablet by mouth daily.   . methocarbamol (ROBAXIN) 500 MG tablet Take 1 tablet (500 mg total) by mouth every 6 (six) hours as needed for muscle spasms.  Marland Kitchen omeprazole (PRILOSEC) 20 MG capsule Take 20 mg by mouth daily.   . pravastatin (PRAVACHOL) 40 MG tablet Take 40 mg by mouth at bedtime.   . traMADol (ULTRAM) 50 MG tablet tramadol 50 mg tablet  . warfarin (COUMADIN) 5 MG tablet Take 2.5-5 mg by mouth See admin instructions.  Take 5 mg by mouth daily at night on Monday and Friday. Take 2.5 mg by mouth daily at night on all other days     Allergies:   Adhesive [tape]; Celebrex [celecoxib]; and Nsaids   Social History   Tobacco Use  . Smoking status: Former Smoker    Packs/day: 1.00    Years: 25.00    Pack years: 25.00    Types: Cigarettes    Last attempt to quit: 09/12/1998    Years since quitting: 20.2  . Smokeless tobacco: Never Used  Substance Use Topics  . Alcohol use: No    Alcohol/week: 0.0 standard drinks  . Drug use: No     Family Hx: The patient's family history includes Arrhythmia in her sister; Breast cancer in her sister; Diabetes in her father, mother, and sister; Heart disease in her father and sister; Stroke in her mother.  ROS:   Please see the history of present illness.    All other systems reviewed and are negative.   Labs/Other Tests and Data Reviewed:    Recent Labs: 02/22/2018: ALT 30 02/28/2018: BUN 72; Creatinine, Ser 1.98; Hemoglobin 9.5; Platelets 163; Potassium 4.7; Sodium 141   Recent Lipid Panel No results found for: CHOL, TRIG, HDL, CHOLHDL, LDLCALC, LDLDIRECT  Wt Readings from Last 3 Encounters:  12/25/18 200 lb (90.7 kg)  09/24/18 207 lb (93.9 kg)  02/27/18 207 lb 6.4 oz (94.1 kg)     Exam:    Vital Signs:  Pulse 89   Temp (!) 96.3 F (35.7 C)   Ht 5'  5" (1.651 m)   Wt 200 lb (90.7 kg)   SpO2 96%   BMI 33.28 kg/m    ASSESSMENT & PLAN:    1.  Syncope and collapse:  R/O arrhythmia  2.  CAF: rate previously controlled on no medications  3.  Chronic anticoagulation:  On Coumadin followed by PCP.  CHADS VASC=4  4.  CRI:  Last SCR 1.98 with GFR of 24  5. Emphysema:  Seen on CT 2017- remote smoker-quit 2000.   6.  HTN:  On Prinizide and Lasix 20 mg  COVID-19 Education: The signs and symptoms of COVID-19 were discussed with the patient and how to seek care for testing (follow up with PCP or arrange E-visit).  The importance of social distancing was discussed today.  Patient Risk:   After full review of this patients clinical status, I feel that they are at least moderate risk at this time.  Time:   Today, I have spent 15 minutes with the patient with telehealth technology discussing syncope.     Medication Adjustments/Labs and Tests Ordered: Current medicines are reviewed at length with the patient today.  Concerns regarding medicines are outlined above.  Tests Ordered: No orders of the defined types were placed in this encounter.  Medication Changes: No orders of the defined types were placed in this encounter.   Disposition:  pt has been instructed to come to the office to be seen by the DOD.   Signed, Kerin Ransom, PA-C  12/25/2018 2:21 PM    [facility]

## 2018-12-25 NOTE — Telephone Encounter (Signed)
Virtual Visit Pre-Appointment Phone Call  Steps For Call:  1. Confirm consent - "In the setting of the current Covid19 crisis, you are scheduled for a (phone or video) visit with your provider on (date) at (time).  Just as we do with many in-office visits, in order for you to participate in this visit, we must obtain consent.  If you'd like, I can send this to your mychart (if signed up) or email for you to review.  Otherwise, I can obtain your verbal consent now.  All virtual visits are billed to your insurance company just like a normal visit would be.  By agreeing to a virtual visit, we'd like you to understand that the technology does not allow for your provider to perform an examination, and thus may limit your provider's ability to fully assess your condition.  Finally, though the technology is pretty good, we cannot assure that it will always work on either your or our end, and in the setting of a video visit, we may have to convert it to a phone-only visit.  In either situation, we cannot ensure that we have a secure connection.  Are you willing to proceed?" STAFF: Did the patient verbally acknowledge consent to telehealth visit? YES  2. Confirm the BEST phone number to call the day of the visit: 1:45pm  3. Give patient instructions for WebEx/MyChart download to smartphone as below or Doximity/Doxy.me if video visit (depending on what platform provider is using)  4. Advise patient to be prepared with any vital sign or heart rhythm information, their current medicines, and a piece of paper and pen handy for any instructions they may receive the day of their visit  5. Inform patient they will receive a phone call 15 minutes prior to their appointment time (may be from unknown caller ID) so they should be prepared to answer  6. Confirm that appointment type is correct in Epic appointment notes (video vs telephone)     TELEPHONE CALL NOTE  [name] [name] has been deemed a candidate for  a follow-up tele-health visit to limit community exposure during the Covid-19 pandemic. I spoke with the patient via phone to ensure availability of phone/video source, confirm preferred email & phone number, and discuss instructions and expectations.  I reminded [name] [name] to be prepared with any vital sign and/or heart rhythm information that could potentially be obtained via home monitoring, at the time of her visit. I reminded [name] [name] to expect a phone call at the time of her visit if her visit.  Harold Hedge, CMA 12/25/2018 1:51 PM   DOWNLOADING THE WEBEX APP TO SMARTPHONE  - If Apple, ask patient to go to [employer] and type in WebEx in the search bar. [employer], the blue/green circle. If Android, go to [employer] and type in [employer] in the search bar. The app is free but as with any other app downloads, their phone may require them to verify saved payment information or Apple/Android password.  - The patient does NOT have to create an account. - On the day of the visit, the assist will walk the patient through joining the meeting with the meeting number/password.  DOWNLOADING THE MYCHART APP TO SMARTPHONE  - If Apple, go to [employer] and type in MyChart in the search bar and download the app. If Android, ask patient to go to [employer] and type in [place] in the search bar and download  the app. The app is free but as with any other app downloads, their phone may require them to verify saved payment information or Apple/Android password.  - The patient will need to then log into the app with their MyChart username and password, and select [facility] as their healthcare provider to link the account. When it is time for your visit, go to the MyChart app, find appointments, and click Begin Video Visit. Be sure to Select Allow for your device to access the Microphone and Camera for your visit. You will then be connected, and your provider will be with  you shortly.  **If they have any issues connecting, or need assistance please contact [place] [phone])**  **If using a computer, in order to ensure the best quality for your visit they will need to use either of the following Internet Browsers: Microsoft Justice, or Google Chrome**  [place]   I hereby voluntarily request, consent and authorize [facility] and its employed or contracted physicians, physician assistants, nurse practitioners or other licensed health care professionals (the Practitioner), to provide me with telemedicine health care services (the Services") as deemed necessary by the treating Practitioner. I acknowledge and consent to receive the Services by the Practitioner via telemedicine. I understand that the telemedicine visit will involve communicating with the Practitioner through live audiovisual communication technology and the disclosure of certain medical information by electronic transmission. I acknowledge that I have been given the opportunity to request an in-person assessment or other available alternative prior to the telemedicine visit and am voluntarily participating in the telemedicine visit.  I understand that I have the right to withhold or withdraw my consent to the use of telemedicine in the course of my care at any time, without affecting my right to future care or treatment, and that the Practitioner or I may terminate the telemedicine visit at any time. I understand that I have the right to inspect all information obtained and/or recorded in the course of the telemedicine visit and may receive copies of available information for a reasonable fee.  I understand that some of the potential risks of receiving the Services via telemedicine include:   Delay or interruption in medical evaluation due to technological equipment failure or disruption;  Information transmitted may not be sufficient (e.g. poor  resolution of images) to allow for appropriate medical decision making by the Practitioner; and/or   In rare instances, security protocols could fail, causing a breach of personal health information.  Furthermore, I acknowledge that it is my responsibility to provide information about my medical history, conditions and care that is complete and accurate to the best of my ability. I acknowledge that Practitioner's advice, recommendations, and/or decision may be based on factors not within their control, such as incomplete or inaccurate data provided by me or distortions of diagnostic images or specimens that may result from electronic transmissions. I understand that the practice of medicine is not an exact science and that Practitioner makes no warranties or guarantees regarding treatment outcomes. I acknowledge that I will receive a copy of this consent concurrently upon execution via email to the email address I last provided but may also request a printed copy by calling the office of [facility].    I understand that my insurance will be billed for this visit.   I have read or had this consent read to me.  I understand the contents of this consent, which adequately explains the benefits  and risks of the Services being provided via telemedicine.   I have been provided ample opportunity to ask questions regarding this consent and the Services and have had my questions answered to my satisfaction.  I give my informed consent for the services to be provided through the use of telemedicine in my medical care  By participating in this telemedicine visit I agree to the above.

## 2018-12-26 ENCOUNTER — Other Ambulatory Visit (INDEPENDENT_AMBULATORY_CARE_PROVIDER_SITE_OTHER): Payer: Medicare HMO

## 2018-12-26 DIAGNOSIS — I482 Chronic atrial fibrillation, unspecified: Secondary | ICD-10-CM | POA: Diagnosis not present

## 2018-12-26 DIAGNOSIS — I10 Essential (primary) hypertension: Secondary | ICD-10-CM

## 2018-12-26 DIAGNOSIS — R55 Syncope and collapse: Secondary | ICD-10-CM

## 2019-01-08 DIAGNOSIS — Z7901 Long term (current) use of anticoagulants: Secondary | ICD-10-CM | POA: Diagnosis not present

## 2019-01-08 DIAGNOSIS — I4821 Permanent atrial fibrillation: Secondary | ICD-10-CM | POA: Diagnosis not present

## 2019-01-21 ENCOUNTER — Telehealth: Payer: Self-pay | Admitting: Cardiology

## 2019-01-22 ENCOUNTER — Telehealth (INDEPENDENT_AMBULATORY_CARE_PROVIDER_SITE_OTHER): Payer: Medicare HMO | Admitting: Cardiology

## 2019-01-22 ENCOUNTER — Telehealth: Payer: Self-pay

## 2019-01-22 ENCOUNTER — Encounter: Payer: Self-pay | Admitting: Cardiology

## 2019-01-22 VITALS — BP 96/60 | HR 76 | Temp 96.5°F | Ht 65.0 in | Wt 207.0 lb

## 2019-01-22 DIAGNOSIS — R55 Syncope and collapse: Secondary | ICD-10-CM

## 2019-01-22 DIAGNOSIS — I951 Orthostatic hypotension: Secondary | ICD-10-CM | POA: Diagnosis not present

## 2019-01-22 DIAGNOSIS — Z7901 Long term (current) use of anticoagulants: Secondary | ICD-10-CM

## 2019-01-22 DIAGNOSIS — I482 Chronic atrial fibrillation, unspecified: Secondary | ICD-10-CM | POA: Diagnosis not present

## 2019-01-22 DIAGNOSIS — N184 Chronic kidney disease, stage 4 (severe): Secondary | ICD-10-CM

## 2019-01-22 DIAGNOSIS — J439 Emphysema, unspecified: Secondary | ICD-10-CM | POA: Diagnosis not present

## 2019-01-22 NOTE — Patient Instructions (Signed)
Medication Instructions:  Your physician recommends that you continue on your current medications as directed. Please refer to the Current Medication list given to you today. If you need a refill on your cardiac medications before your next appointment, please call your pharmacy.   Lab work: None  If you have labs (blood work) drawn today and your tests are completely normal, you will receive your results only by: Marland Kitchen MyChart Message (if you have MyChart) OR . A paper copy in the mail If you have any lab test that is abnormal or we need to change your treatment, we will call you to review the results.  Testing/Procedures: None   Follow-Up: At [facility], you and your health needs are our priority.  As part of our continuing mission to provide you with exceptional heart care, we have created designated Provider Care Teams.  These Care Teams include your primary Cardiologist (physician) and Advanced Practice Providers (APPs -  Physician Assistants and Nurse Practitioners) who all work together to provide you with the care you need, when you need it. You will need a follow up appointment in 3 months.  Please call our office 2 months in advance to schedule this appointment.  You may see Peter Martinique, MD or one of the following Advanced Practice Providers on your designated Care Team: [place], [place] . Fabian Sharp, PA-C  Any Other Special Instructions Will Be Listed Below (If Applicable).

## 2019-01-22 NOTE — Telephone Encounter (Signed)
Contacted patient to discuss AVS instructions. Patient agreeable with Luke recommendations and voiced understanding. 

## 2019-01-22 NOTE — Progress Notes (Signed)
Virtual Visit via Telephone Note   This visit type was conducted due to national recommendations for restrictions regarding the COVID-19 Pandemic (e.g. social distancing) in an effort to limit this patient's exposure and mitigate transmission in our community.  Due to her co-morbid illnesses, this patient is at least at moderate risk for complications without adequate follow up.  This format is felt to be most appropriate for this patient at this time.  The patient did not have access to video technology/had technical difficulties with video requiring transitioning to audio format only (telephone).  All issues noted in this document were discussed and addressed.  No physical exam could be performed with this format.  Please refer to the patient's chart for her  consent to telehealth for [facility].   Date:  01/22/2019   ID:  [name] [name], DOB Dec 14, 1945, MRN [id number]  Patient Location: Home Provider Location: Home  PCP:  Tisovec, Fransico Him, MD  Cardiologist:  Peter Martinique, MD  Electrophysiologist:  None   Evaluation Performed:  Follow-Up Visit  Chief Complaint:  none  History of Present Illness:    [name] [name] is a 73 y.o. female with a  history of permanent atrial fibrillation.  She has had controlled ventricular response on no medications.  Prior Holter monitor has shown rates from 40-138.  She did have a 2.5-second pause documented in the past.  Her last echocardiogram was in 2010.  She saw Dr. Martinique in January 2020 and was doing well.  She is on chronic Coumadin and followed by her primary care provider.  She also has significant renal insufficiency and some anemia.  Her creatinine in June 2019 was 1.98 with a GFR in the 20s.  Her hemoglobin then was 9.5.   She had a virtual visit with me 12/25/2018.  She had had a couple of episodes of syncope.  I had her go to the office and she was seen by Dr Debara Pickett.  She was orthostatic.  Her Lisinopril HCTZ was stopped.  The patient told me  she had labs drawn by her PCP just prior to the 12/25/2018 visit.   The patient has done well since, no further syncope or near syncope.    The patient does not have symptoms concerning for COVID-19 infection (fever, chills, cough, or new shortness of breath).    Past Medical History:  Diagnosis Date   AF (atrial fibrillation) (HCC)    Arthritis    OA   Cholelithiasis    Colon polyps    Diverticulosis    DJD (degenerative joint disease)    Dyslipidemia    Fatty liver disease, nonalcoholic    GERD (gastroesophageal reflux disease)    Gout    Hepatitis A AGE 65   History of hiatal hernia    PT THINKS   Hypertension    Hypothyroidism    Obesity    Past Surgical History:  Procedure Laterality Date   BREAST BIOPSY Bilateral    1 right, 2 left   CHOLECYSTECTOMY  22 YRS AGO   CYST ON CERVIX REMOVED  1965   ERCP  20 YRS AGO   ERCP N/A 04/12/2016   Procedure: ENDOSCOPIC RETROGRADE CHOLANGIOPANCREATOGRAPHY (ERCP);  Surgeon: Irene Shipper, MD;  Location: [facility];  Service: Endoscopy;  Laterality: N/A;   EYE SURGERY Bilateral 2015   LASER SURGERY DONE ALSO BOTH EYES FOR FILM OVER EYES   KNEE ARTHROSCOPY 20 YRS AGO Left    PLANTAR FASCIA SURGERY Left  TOENAIL EXCISION Bilateral    TONSILLECTOMY     TOTAL ABDOMINAL HYSTERECTOMY     with appendectomy, COMPLETE   TOTAL KNEE ARTHROPLASTY Left 09/25/2017   Procedure: LEFT TOTAL KNEE ARTHROPLASTY;  Surgeon: Paralee Cancel, MD;  Location: [facility];  Service: Orthopedics;  Laterality: Left;  90 mins   TOTAL KNEE ARTHROPLASTY Right 02/27/2018   Procedure: RIGHT TOTAL KNEE ARTHROPLASTY;  Surgeon: Paralee Cancel, MD;  Location: [facility];  Service: Orthopedics;  Laterality: Right;  Adductor Block     Current Meds  Medication Sig   allopurinol (ZYLOPRIM) 300 MG tablet Take 300 mg by mouth daily.    COENZYME Q10 PO Take 1 capsule by mouth daily.    fexofenadine (ALLEGRA) 180 MG tablet Take 180 mg by mouth daily.     furosemide (LASIX) 20 MG tablet Take 20 mg by mouth daily.    Investigational - Study Medication Take 3 tablets by mouth daily. Study name: Coco and multi vitamin   levothyroxine (SYNTHROID, LEVOTHROID) 100 MCG tablet Take 100 mcg by mouth daily before breakfast.   Liniments (SALONPAS PAIN RELIEF PATCH EX) Apply 1 patch topically daily as needed (pain).   LINZESS 72 MCG capsule Take 1 capsule by mouth daily.   methocarbamol (ROBAXIN) 500 MG tablet Take 1 tablet (500 mg total) by mouth every 6 (six) hours as needed for muscle spasms. (Patient taking differently: Take 500 mg by mouth 2 (two) times a day. )   omeprazole (PRILOSEC) 20 MG capsule Take 20 mg by mouth daily.    pravastatin (PRAVACHOL) 40 MG tablet Take 40 mg by mouth at bedtime.    traMADol (ULTRAM) 50 MG tablet Take 50 mg by mouth as needed.    warfarin (COUMADIN) 5 MG tablet Take 2.5-5 mg by mouth See admin instructions. Take 5 mg by mouth daily at night on Monday and Friday. Take 2.5 mg by mouth daily at night on all other days     Allergies:   Adhesive [tape]; Celebrex [celecoxib]; and Nsaids   Social History   Tobacco Use   Smoking status: Former Smoker    Packs/day: 1.00    Years: 25.00    Pack years: 25.00    Types: Cigarettes    Last attempt to quit: 09/12/1998    Years since quitting: 20.3   Smokeless tobacco: Never Used  Substance Use Topics   Alcohol use: No    Alcohol/week: 0.0 standard drinks   Drug use: No     Family Hx: The patient's family history includes Arrhythmia in her sister; Breast cancer in her sister; Diabetes in her father, mother, and sister; Heart disease in her father and sister; Stroke in her mother.  ROS:   Please see the history of present illness.    All other systems reviewed and are negative.   Prior CV studies:   The following studies were reviewed today:   Labs/Other Tests and Data Reviewed:    EKG:  No ECG reviewed.  Recent Labs: 02/22/2018: ALT  30 02/28/2018: BUN 72; Creatinine, Ser 1.98; Hemoglobin 9.5; Platelets 163; Potassium 4.7; Sodium 141   Recent Lipid Panel No results found for: CHOL, TRIG, HDL, CHOLHDL, LDLCALC, LDLDIRECT  Wt Readings from Last 3 Encounters:  01/22/19 207 lb (93.9 kg)  12/25/18 201 lb 14.4 oz (91.6 kg)  12/25/18 200 lb (90.7 kg)     Objective:    Vital Signs:  BP 96/60    Pulse 76    Temp (!) 96.5 F (35.8 C)  Ht 5\' 5"  (1.651 m)    Wt 207 lb (93.9 kg)    SpO2 96%    BMI 34.45 kg/m    VITAL SIGNS:  reviewed  ASSESSMENT & PLAN:    1.  Syncope and collapse:  secondary to orthostatic hypotension, no recurrence since lisnopril HCTZ stopped 12/25/2018.   2.  CAF: rate previously controlled on no medications  3.  Chronic anticoagulation:  On Coumadin followed by PCP.  CHADS VASC=4  4.  CRI:  Last SCR 1.98 with GFR of 24  5. Emphysema:  Seen on CT 2017- remote smoker-quit 2000.   COVID-19 Education: The signs and symptoms of COVID-19 were discussed with the patient and how to seek care for testing (follow up with PCP or arrange E-visit).  The importance of social distancing was discussed today.  Time:   Today, I have spent 5 minutes with the patient with telehealth technology discussing the above problems.     Medication Adjustments/Labs and Tests Ordered: Current medicines are reviewed at length with the patient today.  Concerns regarding medicines are outlined above.   Tests Ordered: No orders of the defined types were placed in this encounter.   Medication Changes: No orders of the defined types were placed in this encounter.   Disposition:  Follow up Dr Martinique- I will see if I can get copies of her labs from April  Signed, Koehn Salehi, [place]  01/22/2019 11:01 AM    [place]

## 2019-01-24 NOTE — Telephone Encounter (Signed)
VERBAL CONSENT GIVEN TO [place] ON 01/22/2019.     Virtual Visit Pre-Appointment Phone Call  "(Name), I am calling you today to discuss your upcoming appointment. We are currently trying to limit exposure to the virus that causes COVID-19 by seeing patients at home rather than in the office."  1. "What is the BEST phone number to call the day of the visit?" - include this in appointment notes  2. "Do you have or have access to (through a family member/friend) a smartphone with video capability that we can use for your visit?" a. If yes - list this number in appt notes as "cell" (if different from BEST phone #) and list the appointment type as a VIDEO visit in appointment notes b. If no - list the appointment type as a PHONE visit in appointment notes  3. Confirm consent - "In the setting of the current Covid19 crisis, you are scheduled for a (phone or video) visit with your provider on (date) at (time).  Just as we do with many in-office visits, in order for you to participate in this visit, we must obtain consent.  If you'd like, I can send this to your mychart (if signed up) or email for you to review.  Otherwise, I can obtain your verbal consent now.  All virtual visits are billed to your insurance company just like a normal visit would be.  By agreeing to a virtual visit, we'd like you to understand that the technology does not allow for your provider to perform an examination, and thus may limit your provider's ability to fully assess your condition. If your provider identifies any concerns that need to be evaluated in person, we will make arrangements to do so.  Finally, though the technology is pretty good, we cannot assure that it will always work on either your or our end, and in the setting of a video visit, we may have to convert it to a phone-only visit.  In either situation, we cannot ensure that we have a secure connection.  Are you willing to proceed?" STAFF: Did the patient  verbally acknowledge consent to telehealth visit? Document YES/NO here: YES  4. Advise patient to be prepared - "Two hours prior to your appointment, go ahead and check your blood pressure, pulse, oxygen saturation, and your weight (if you have the equipment to check those) and write them all down. When your visit starts, your provider will ask you for this information. If you have an Apple Watch or Kardia device, please plan to have heart rate information ready on the day of your appointment. Please have a pen and paper handy nearby the day of the visit as well."  5. Give patient instructions for MyChart download to smartphone OR Doximity/Doxy.me as below if video visit (depending on what platform provider is using)  6. Inform patient they will receive a phone call 15 minutes prior to their appointment time (may be from unknown caller ID) so they should be prepared to answer    TELEPHONE CALL NOTE  [name] [name] has been deemed a candidate for a follow-up tele-health visit to limit community exposure during the Covid-19 pandemic. I spoke with the patient via phone to ensure availability of phone/video source, confirm preferred email & phone number, and discuss instructions and expectations.  I reminded TYFFANY WALDROP to be prepared with any vital sign and/or heart rhythm information that could potentially be obtained via home monitoring, at the time of her visit. I reminded  [name] to expect a phone call prior to her visit.  Harold Hedge, CMA 01/24/2019 5:06 PM   INSTRUCTIONS FOR DOWNLOADING THE MYCHART APP TO SMARTPHONE  - The patient must first make sure to have activated MyChart and know their login information - If Apple, go to [employer] and type in MyChart in the search bar and download the app. If Android, ask patient to go to [employer] and type in [place] in the search bar and download the app. The app is free but as with any other app downloads, their phone may require  them to verify saved payment information or Apple/Android password.  - The patient will need to then log into the app with their MyChart username and password, and select [facility] as their healthcare provider to link the account. When it is time for your visit, go to the MyChart app, find appointments, and click Begin Video Visit. Be sure to Select Allow for your device to access the Microphone and Camera for your visit. You will then be connected, and your provider will be with you shortly.  **If they have any issues connecting, or need assistance please contact MyChart service desk [phone])**  **If using a computer, in order to ensure the best quality for their visit they will need to use either of the following Internet Browsers: [employer], or Google Chrome**  IF USING DOXIMITY or DOXY.ME - The patient will receive a link just prior to their visit by text.     FULL LENGTH CONSENT FOR TELE-HEALTH VISIT   I hereby voluntarily request, consent and authorize [facility] and its employed or contracted physicians, physician assistants, nurse practitioners or other licensed health care professionals (the Practitioner), to provide me with telemedicine health care services (the "Services") as deemed necessary by the treating Practitioner. I acknowledge and consent to receive the Services by the Practitioner via telemedicine. I understand that the telemedicine visit will involve communicating with the Practitioner through live audiovisual communication technology and the disclosure of certain medical information by electronic transmission. I acknowledge that I have been given the opportunity to request an in-person assessment or other available alternative prior to the telemedicine visit and am voluntarily participating in the telemedicine visit.  I understand that I have the right to withhold or withdraw my consent to the use of telemedicine in the course of my care at any  time, without affecting my right to future care or treatment, and that the Practitioner or I may terminate the telemedicine visit at any time. I understand that I have the right to inspect all information obtained and/or recorded in the course of the telemedicine visit and may receive copies of available information for a reasonable fee.  I understand that some of the potential risks of receiving the Services via telemedicine include:  Marland Kitchen Delay or interruption in medical evaluation due to technological equipment failure or disruption; . Information transmitted may not be sufficient (e.g. poor resolution of images) to allow for appropriate medical decision making by the Practitioner; and/or  . In rare instances, security protocols could fail, causing a breach of personal health information.  Furthermore, I acknowledge that it is my responsibility to provide information about my medical history, conditions and care that is complete and accurate to the best of my ability. I acknowledge that Practitioner's advice, recommendations, and/or decision may be based on factors not within their control, such as incomplete or inaccurate data provided by me or distortions of  diagnostic images or specimens that may result from electronic transmissions. I understand that the practice of medicine is not an exact science and that Practitioner makes no warranties or guarantees regarding treatment outcomes. I acknowledge that I will receive a copy of this consent concurrently upon execution via email to the email address I last provided but may also request a printed copy by calling the office of [facility].    I understand that my insurance will be billed for this visit.   I have read or had this consent read to me. . I understand the contents of this consent, which adequately explains the benefits and risks of the Services being provided via telemedicine.  . I have been provided ample opportunity to ask questions  regarding this consent and the Services and have had my questions answered to my satisfaction. . I give my informed consent for the services to be provided through the use of telemedicine in my medical care  By participating in this telemedicine visit I agree to the above.

## 2019-01-28 NOTE — Telephone Encounter (Signed)
° °  Patient calling to discuss diagnosis listed on AVS.  Patient states Chronic renal disease, stage 4 should not be listed

## 2019-01-28 NOTE — Telephone Encounter (Signed)
Pt does not understand why it says renal disease stage 4  on AVS and informed based on past labs has renal insuffiency.Pt did not agree with explanation Will forward to [facility]

## 2019-01-29 NOTE — Telephone Encounter (Signed)
Please tell her we are just going by what we see- lets ask if Dr Odette Fraction has any more recent labs that may indicate she is not stage 4  Sanjuana Mruk [facility] PA-C 01/29/2019 11:46 AM

## 2019-01-30 DIAGNOSIS — I4821 Permanent atrial fibrillation: Secondary | ICD-10-CM | POA: Diagnosis not present

## 2019-01-30 DIAGNOSIS — Z7901 Long term (current) use of anticoagulants: Secondary | ICD-10-CM | POA: Diagnosis not present

## 2019-01-31 NOTE — Telephone Encounter (Signed)
Pt aware forwarded message to Dr Osborne Casco .[name] [name]

## 2019-02-05 ENCOUNTER — Telehealth: Payer: Self-pay | Admitting: Cardiology

## 2019-02-05 NOTE — Telephone Encounter (Signed)
[name] [name] had called after her LOV with me concerned about the diagnosis of CRI-4. I explained that it was based on lab work.  Her [place] in jan 2019 was 1.4.  It gradually crept up to 1.98 in June 2019.  In April 2020 Dr Debara Pickett stopped her Lisinopril HCTZ.  One week later her SCr was 2.4 (GFR 19).  She told me that she was to see Dr Osborne Casco in July and I expect he will recheck her BMP then. I advised her to avoid NSAIDs which she does.   Kerin Ransom PA-C 02/05/2019 4:45 PM

## 2019-02-26 DIAGNOSIS — Z1231 Encounter for screening mammogram for malignant neoplasm of breast: Secondary | ICD-10-CM | POA: Diagnosis not present

## 2019-02-26 DIAGNOSIS — Z803 Family history of malignant neoplasm of breast: Secondary | ICD-10-CM | POA: Diagnosis not present

## 2019-02-27 DIAGNOSIS — R600 Localized edema: Secondary | ICD-10-CM | POA: Insufficient documentation

## 2019-02-27 DIAGNOSIS — I129 Hypertensive chronic kidney disease with stage 1 through stage 4 chronic kidney disease, or unspecified chronic kidney disease: Secondary | ICD-10-CM | POA: Diagnosis not present

## 2019-02-27 DIAGNOSIS — N184 Chronic kidney disease, stage 4 (severe): Secondary | ICD-10-CM | POA: Diagnosis not present

## 2019-03-06 DIAGNOSIS — Z7901 Long term (current) use of anticoagulants: Secondary | ICD-10-CM | POA: Diagnosis not present

## 2019-03-06 DIAGNOSIS — I4821 Permanent atrial fibrillation: Secondary | ICD-10-CM | POA: Diagnosis not present

## 2019-03-26 DIAGNOSIS — R82998 Other abnormal findings in urine: Secondary | ICD-10-CM | POA: Diagnosis not present

## 2019-03-26 DIAGNOSIS — M109 Gout, unspecified: Secondary | ICD-10-CM | POA: Diagnosis not present

## 2019-03-26 DIAGNOSIS — Z0000 Encounter for general adult medical examination without abnormal findings: Secondary | ICD-10-CM | POA: Insufficient documentation

## 2019-03-26 DIAGNOSIS — E038 Other specified hypothyroidism: Secondary | ICD-10-CM | POA: Diagnosis not present

## 2019-03-26 DIAGNOSIS — I10 Essential (primary) hypertension: Secondary | ICD-10-CM | POA: Diagnosis not present

## 2019-04-02 DIAGNOSIS — E7800 Pure hypercholesterolemia, unspecified: Secondary | ICD-10-CM | POA: Insufficient documentation

## 2019-04-02 DIAGNOSIS — N184 Chronic kidney disease, stage 4 (severe): Secondary | ICD-10-CM | POA: Diagnosis not present

## 2019-04-02 DIAGNOSIS — E669 Obesity, unspecified: Secondary | ICD-10-CM | POA: Diagnosis not present

## 2019-04-02 DIAGNOSIS — I4891 Unspecified atrial fibrillation: Secondary | ICD-10-CM | POA: Diagnosis not present

## 2019-04-02 DIAGNOSIS — Z0000 Encounter for general adult medical examination without abnormal findings: Secondary | ICD-10-CM | POA: Diagnosis not present

## 2019-04-02 DIAGNOSIS — Z7901 Long term (current) use of anticoagulants: Secondary | ICD-10-CM | POA: Diagnosis not present

## 2019-04-02 DIAGNOSIS — I129 Hypertensive chronic kidney disease with stage 1 through stage 4 chronic kidney disease, or unspecified chronic kidney disease: Secondary | ICD-10-CM | POA: Diagnosis not present

## 2019-04-02 DIAGNOSIS — G4730 Sleep apnea, unspecified: Secondary | ICD-10-CM | POA: Diagnosis not present

## 2019-04-02 DIAGNOSIS — K5909 Other constipation: Secondary | ICD-10-CM | POA: Diagnosis not present

## 2019-04-02 DIAGNOSIS — Z96659 Presence of unspecified artificial knee joint: Secondary | ICD-10-CM | POA: Diagnosis not present

## 2019-04-09 DIAGNOSIS — M722 Plantar fascial fibromatosis: Secondary | ICD-10-CM | POA: Insufficient documentation

## 2019-04-10 DIAGNOSIS — R600 Localized edema: Secondary | ICD-10-CM | POA: Diagnosis not present

## 2019-04-10 DIAGNOSIS — Z7901 Long term (current) use of anticoagulants: Secondary | ICD-10-CM | POA: Diagnosis not present

## 2019-04-10 DIAGNOSIS — I4821 Permanent atrial fibrillation: Secondary | ICD-10-CM | POA: Diagnosis not present

## 2019-04-12 DIAGNOSIS — M7732 Calcaneal spur, left foot: Secondary | ICD-10-CM | POA: Diagnosis not present

## 2019-04-12 DIAGNOSIS — M25775 Osteophyte, left foot: Secondary | ICD-10-CM | POA: Diagnosis not present

## 2019-04-12 DIAGNOSIS — M722 Plantar fascial fibromatosis: Secondary | ICD-10-CM | POA: Diagnosis not present

## 2019-04-12 DIAGNOSIS — M7752 Other enthesopathy of left foot: Secondary | ICD-10-CM | POA: Diagnosis not present

## 2019-04-19 DIAGNOSIS — M67472 Ganglion, left ankle and foot: Secondary | ICD-10-CM | POA: Diagnosis not present

## 2019-04-19 DIAGNOSIS — M722 Plantar fascial fibromatosis: Secondary | ICD-10-CM | POA: Diagnosis not present

## 2019-04-22 DIAGNOSIS — K5909 Other constipation: Secondary | ICD-10-CM | POA: Diagnosis not present

## 2019-04-22 DIAGNOSIS — R21 Rash and other nonspecific skin eruption: Secondary | ICD-10-CM | POA: Diagnosis not present

## 2019-04-29 DIAGNOSIS — M722 Plantar fascial fibromatosis: Secondary | ICD-10-CM | POA: Diagnosis not present

## 2019-04-29 DIAGNOSIS — M7752 Other enthesopathy of left foot: Secondary | ICD-10-CM | POA: Diagnosis not present

## 2019-05-09 DIAGNOSIS — Z7901 Long term (current) use of anticoagulants: Secondary | ICD-10-CM | POA: Diagnosis not present

## 2019-05-09 DIAGNOSIS — Z23 Encounter for immunization: Secondary | ICD-10-CM | POA: Diagnosis not present

## 2019-05-09 DIAGNOSIS — I4821 Permanent atrial fibrillation: Secondary | ICD-10-CM | POA: Diagnosis not present

## 2019-05-28 ENCOUNTER — Encounter

## 2019-05-30 DIAGNOSIS — H52203 Unspecified astigmatism, bilateral: Secondary | ICD-10-CM | POA: Diagnosis not present

## 2019-05-30 DIAGNOSIS — H524 Presbyopia: Secondary | ICD-10-CM | POA: Diagnosis not present

## 2019-05-31 DIAGNOSIS — M7061 Trochanteric bursitis, right hip: Secondary | ICD-10-CM | POA: Diagnosis not present

## 2019-05-31 DIAGNOSIS — M25551 Pain in right hip: Secondary | ICD-10-CM | POA: Diagnosis not present

## 2019-06-13 DIAGNOSIS — Z7901 Long term (current) use of anticoagulants: Secondary | ICD-10-CM | POA: Diagnosis not present

## 2019-06-13 DIAGNOSIS — I4821 Permanent atrial fibrillation: Secondary | ICD-10-CM | POA: Diagnosis not present

## 2019-06-26 NOTE — Progress Notes (Signed)
[name] [name] Date of Birth: February 21, 1946   History of Present Illness: [name] [name] is seen for  followup of atrial fibrillation. She has a history of permanent atrial fibrillation treated with  anticoagulation with Coumadin. No need for rate control therapy since HR has been normal. Prior Holter monitor showed average HR 72. Range 38-139. Longest pause 2.5 sec. She was seen in January 2019 for clearance for knee surgery and was doing well.    In August 2017 she had ERCP for common duct stone. In November 2017 she had colonoscopy with findings of three polyps. In June 2019 she had left TKR then in June 2019 right TKR.  She was seen in April after 2 syncopal episodes. She was found to have orthostatic hypotension and lisinopril HCT was stopped. On follow up she has had no further syncope.  On follow up today she is doing well. No chest pain, dyspnea or palpitations. No dizziness. No syncope.   Current Outpatient Medications on File Prior to Visit  Medication Sig Dispense Refill  . allopurinol (ZYLOPRIM) 300 MG tablet Take 300 mg by mouth daily.     Marland Kitchen COENZYME Q10 PO Take 1 capsule by mouth daily.     . fexofenadine (ALLEGRA) 180 MG tablet Take 180 mg by mouth daily.     . furosemide (LASIX) 20 MG tablet Take 20 mg by mouth daily.     . Investigational - Study Medication Take 3 tablets by mouth daily. Study name: Coco and multi vitamin    . levothyroxine (SYNTHROID, LEVOTHROID) 100 MCG tablet Take 100 mcg by mouth daily before breakfast.    . Liniments (SALONPAS PAIN RELIEF PATCH EX) Apply 1 patch topically daily as needed (pain).    Marland Kitchen LINZESS 72 MCG capsule Take 1 capsule by mouth daily.    . methocarbamol (ROBAXIN) 500 MG tablet Take 1 tablet (500 mg total) by mouth every 6 (six) hours as needed for muscle spasms. (Patient taking differently: Take 500 mg by mouth 2 (two) times a day. ) 40 tablet 0  . omeprazole (PRILOSEC) 20 MG capsule Take 20 mg by mouth daily.     . pravastatin (PRAVACHOL)  40 MG tablet Take 40 mg by mouth at bedtime.     . traMADol (ULTRAM) 50 MG tablet Take 50 mg by mouth as needed.     . warfarin (COUMADIN) 5 MG tablet Take 2.5-5 mg by mouth See admin instructions. Take 5 mg by mouth daily at night on Monday and Friday. Take 2.5 mg by mouth daily at night on all other days     No current facility-administered medications on file prior to visit.     Allergies  Allergen Reactions  . Adhesive [Tape] Other (See Comments)    Tears skin  . Celebrex [Celecoxib] Other (See Comments)    Causes nose bleeds  . Nsaids Other (See Comments)    Causes afib    Past Medical History:  Diagnosis Date  . AF (atrial fibrillation) ([place])   . Arthritis    OA  . Cholelithiasis   . Colon polyps   . Diverticulosis   . DJD (degenerative joint disease)   . Dyslipidemia   . Fatty liver disease, nonalcoholic   . GERD (gastroesophageal reflux disease)   . Gout   . Hepatitis A AGE 28  . History of hiatal hernia    PT THINKS  . Hypertension   . Hypothyroidism   . Obesity     Past Surgical History:  Procedure Laterality Date  . BREAST BIOPSY Bilateral    1 right, 2 left  . CHOLECYSTECTOMY  22 YRS AGO  . CYST ON CERVIX REMOVED  1965  . ERCP  20 YRS AGO  . ERCP N/A 04/12/2016   Procedure: ENDOSCOPIC RETROGRADE CHOLANGIOPANCREATOGRAPHY (ERCP);  Surgeon: Irene Shipper, MD;  Location: [facility];  Service: Endoscopy;  Laterality: N/A;  . EYE SURGERY Bilateral 2015   LASER SURGERY DONE ALSO BOTH EYES FOR FILM OVER EYES  . KNEE ARTHROSCOPY 20 YRS AGO Left   . PLANTAR FASCIA SURGERY Left   . TOENAIL EXCISION Bilateral   . TONSILLECTOMY    . TOTAL ABDOMINAL HYSTERECTOMY     with appendectomy, COMPLETE  . TOTAL KNEE ARTHROPLASTY Left 09/25/2017   Procedure: LEFT TOTAL KNEE ARTHROPLASTY;  Surgeon: Paralee Cancel, MD;  Location: [facility];  Service: Orthopedics;  Laterality: Left;  90 mins  . TOTAL KNEE ARTHROPLASTY Right 02/27/2018   Procedure: RIGHT TOTAL KNEE ARTHROPLASTY;   Surgeon: Paralee Cancel, MD;  Location: [facility];  Service: Orthopedics;  Laterality: Right;  Adductor Block    Social History   Tobacco Use  Smoking Status Former Smoker  . Packs/day: 1.00  . Years: 25.00  . Pack years: 25.00  . Types: Cigarettes  . Quit date: 09/12/1998  . Years since quitting: 20.8  Smokeless Tobacco Never Used    Social History   Substance and Sexual Activity  Alcohol Use No  . Alcohol/week: 0.0 standard drinks    Family History  Problem Relation Age of Onset  . Diabetes Mother   . Stroke Mother   . Heart disease Father   . Diabetes Father   . Arrhythmia Sister   . Heart disease Sister        x 2  . Breast cancer Sister        x 2  . Diabetes Sister        x 2    Review of Systems: As noted in history of present illness. All other systems were reviewed and are negative.  Physical Exam: BP (!) 150/78 (BP Location: Right Arm, Cuff Size: Normal)   Pulse 76   Ht 5\' 5"  (1.651 m)   Wt 207 lb (93.9 kg)   SpO2 96%   BMI 34.45 kg/m  GENERAL:  Well appearing WF in NAD HEENT:  PERRL, EOMI, sclera are clear. Oropharynx is clear. NECK:  No jugular venous distention, carotid upstroke brisk and symmetric, no bruits, no thyromegaly or adenopathy LUNGS:  Clear to auscultation bilaterally CHEST:  Unremarkable HEART:  IRRR,  PMI not displaced or sustained,S1 and S2 within normal limits, no S3, no S4: no clicks, no rubs, no murmurs ABD:  Soft, nontender. BS +, no masses or bruits. No hepatomegaly, no splenomegaly EXT:  2 + pulses throughout, no edema, no cyanosis no clubbing SKIN:  Warm and dry.  No rashes NEURO:  Alert and oriented x 3. Cranial nerves II through XII intact. PSYCH:  Cognitively intact    LABORATORY DATA:  Lab Results  Component Value Date   WBC 15.2 (H) 02/28/2018   HGB 9.5 (L) 02/28/2018   HCT 28.5 (L) 02/28/2018   PLT 163 02/28/2018   GLUCOSE 146 (H) 02/28/2018   ALT 30 02/22/2018   AST 31 02/22/2018   NA 141 02/28/2018   K 4.7  02/28/2018   CL 109 02/28/2018   CREATININE 1.98 (H) 02/28/2018   BUN 72 (H) 02/28/2018   CO2 23 02/28/2018   INR  1.7 (A) 12/25/2018   Labs dated 03/03/16: cholesterol 154, triglycerides 72, LDL 86, HDL 54.  Dated 09/23/16: BUN 59, creatinine 1.5. CMET, TSH, CBC normal.  Dated 03/13/18: cholesterol 141, triglycerides 59, HDL 49, LDL 80. Hgb 10, creatinine 1.6. ALT and TSH normal. Dated 03/26/19: cholesterol 145, triglycerides 66, HDL 56, LDL 76. Creatinine 1.3. other chemistries and TSH normal.     Assessment / Plan: 1. Atrial fibrillation. Rate is well controlled on no medication. She is on anticoagulation with Coumadin.  Mali vasc score of 3. She is asymptomatic.  2. Morbid obesity. Encouraged continued weight loss.   3. Hypercholesterolemia-on pravastatin. Controlled.   4. Orthostatic hypotension with syncope resolved with stopping lisinopril HCT. BP is a little high now. Will monitor. If BP remains high may need to consider addition of low dose lisinopril   5. CKD improved off HCTZ

## 2019-07-02 ENCOUNTER — Encounter: Payer: Self-pay | Admitting: Cardiology

## 2019-07-02 ENCOUNTER — Other Ambulatory Visit: Payer: Self-pay

## 2019-07-02 ENCOUNTER — Ambulatory Visit (INDEPENDENT_AMBULATORY_CARE_PROVIDER_SITE_OTHER): Payer: Medicare HMO | Admitting: Cardiology

## 2019-07-02 VITALS — BP 150/78 | HR 76 | Ht 65.0 in | Wt 207.0 lb

## 2019-07-02 DIAGNOSIS — I951 Orthostatic hypotension: Secondary | ICD-10-CM | POA: Diagnosis not present

## 2019-07-02 DIAGNOSIS — I482 Chronic atrial fibrillation, unspecified: Secondary | ICD-10-CM | POA: Diagnosis not present

## 2019-07-02 DIAGNOSIS — N184 Chronic kidney disease, stage 4 (severe): Secondary | ICD-10-CM

## 2019-07-02 DIAGNOSIS — I10 Essential (primary) hypertension: Secondary | ICD-10-CM | POA: Diagnosis not present

## 2019-07-12 DIAGNOSIS — M25551 Pain in right hip: Secondary | ICD-10-CM | POA: Diagnosis not present

## 2019-07-12 DIAGNOSIS — M7061 Trochanteric bursitis, right hip: Secondary | ICD-10-CM | POA: Diagnosis not present

## 2019-07-17 DIAGNOSIS — Z7901 Long term (current) use of anticoagulants: Secondary | ICD-10-CM | POA: Diagnosis not present

## 2019-07-17 DIAGNOSIS — I4821 Permanent atrial fibrillation: Secondary | ICD-10-CM | POA: Diagnosis not present

## 2019-07-29 ENCOUNTER — Other Ambulatory Visit: Payer: Self-pay

## 2019-07-29 DIAGNOSIS — Z20828 Contact with and (suspected) exposure to other viral communicable diseases: Secondary | ICD-10-CM | POA: Diagnosis not present

## 2019-07-29 DIAGNOSIS — Z20822 Contact with and (suspected) exposure to covid-19: Secondary | ICD-10-CM

## 2019-07-31 LAB — NOVEL CORONAVIRUS, NAA: SARS-CoV-2, NAA: DETECTED — AB

## 2019-08-12 ENCOUNTER — Other Ambulatory Visit: Payer: Self-pay

## 2019-08-12 DIAGNOSIS — Z20822 Contact with and (suspected) exposure to covid-19: Secondary | ICD-10-CM

## 2019-08-13 LAB — NOVEL CORONAVIRUS, NAA: SARS-CoV-2, NAA: NOT DETECTED

## 2019-08-29 DIAGNOSIS — I4821 Permanent atrial fibrillation: Secondary | ICD-10-CM | POA: Diagnosis not present

## 2019-08-29 DIAGNOSIS — Z7901 Long term (current) use of anticoagulants: Secondary | ICD-10-CM | POA: Diagnosis not present

## 2019-10-03 DIAGNOSIS — Z7901 Long term (current) use of anticoagulants: Secondary | ICD-10-CM | POA: Diagnosis not present

## 2019-10-03 DIAGNOSIS — I4821 Permanent atrial fibrillation: Secondary | ICD-10-CM | POA: Diagnosis not present

## 2019-10-04 DIAGNOSIS — I77810 Thoracic aortic ectasia: Secondary | ICD-10-CM | POA: Diagnosis not present

## 2019-10-04 DIAGNOSIS — E038 Other specified hypothyroidism: Secondary | ICD-10-CM | POA: Diagnosis not present

## 2019-10-04 DIAGNOSIS — N184 Chronic kidney disease, stage 4 (severe): Secondary | ICD-10-CM | POA: Diagnosis not present

## 2019-10-04 DIAGNOSIS — E7800 Pure hypercholesterolemia, unspecified: Secondary | ICD-10-CM | POA: Diagnosis not present

## 2019-10-04 DIAGNOSIS — Z7901 Long term (current) use of anticoagulants: Secondary | ICD-10-CM | POA: Diagnosis not present

## 2019-10-04 DIAGNOSIS — G4730 Sleep apnea, unspecified: Secondary | ICD-10-CM | POA: Diagnosis not present

## 2019-10-04 DIAGNOSIS — E669 Obesity, unspecified: Secondary | ICD-10-CM | POA: Diagnosis not present

## 2019-10-04 DIAGNOSIS — D692 Other nonthrombocytopenic purpura: Secondary | ICD-10-CM | POA: Diagnosis not present

## 2019-10-04 DIAGNOSIS — K5909 Other constipation: Secondary | ICD-10-CM | POA: Diagnosis not present

## 2019-10-04 DIAGNOSIS — I129 Hypertensive chronic kidney disease with stage 1 through stage 4 chronic kidney disease, or unspecified chronic kidney disease: Secondary | ICD-10-CM | POA: Diagnosis not present

## 2019-11-20 DIAGNOSIS — Z7901 Long term (current) use of anticoagulants: Secondary | ICD-10-CM | POA: Diagnosis not present

## 2019-11-20 DIAGNOSIS — I4821 Permanent atrial fibrillation: Secondary | ICD-10-CM | POA: Diagnosis not present

## 2019-12-30 NOTE — Progress Notes (Signed)
[name] [name] Date of Birth: [birth year]   History of Present Illness: [name] is seen for  followup of atrial fibrillation. She has a history of permanent atrial fibrillation treated with  anticoagulation with Coumadin. No need for rate control therapy since HR has been normal. Prior Holter monitor showed average HR 72. Range 38-139. Longest pause 2.5 sec. She was seen in January 2019 for clearance for knee surgery and was doing well.    In August 2017 she had ERCP for common duct stone. In November 2017 she had colonoscopy with findings of three polyps. In June 2019 she had left TKR then in June 2019 right TKR.  She was seen in April 2019after 2 syncopal episodes. She was found to have orthostatic hypotension and lisinopril HCT was stopped. On follow up she has had no further syncope.  On follow up today she is doing well. She did fall last week and strained her neck. No chest pain, dyspnea or palpitations. No dizziness. No syncope. Reports BP is much better at home.  Current Outpatient Medications on File Prior to Visit  Medication Sig Dispense Refill  . allopurinol (ZYLOPRIM) 300 MG tablet Take 300 mg by mouth daily.     Marland Kitchen COENZYME Q10 PO Take 1 capsule by mouth daily.     . fexofenadine (ALLEGRA) 180 MG tablet Take 180 mg by mouth daily.     . furosemide (LASIX) 20 MG tablet Take 40 mg by mouth daily.    . Investigational - Study Medication Take 3 tablets by mouth daily. Study name: Coco and multi vitamin    . levothyroxine (SYNTHROID, LEVOTHROID) 100 MCG tablet Take 100 mcg by mouth daily before breakfast.    . Liniments (SALONPAS PAIN RELIEF PATCH EX) Apply 1 patch topically daily as needed (pain).    Marland Kitchen LINZESS 72 MCG capsule Take 1 capsule by mouth daily.    . methocarbamol (ROBAXIN) 500 MG tablet Take 1 tablet (500 mg total) by mouth every 6 (six) hours as needed for muscle spasms. (Patient taking differently: Take 500 mg by mouth 2 (two) times a day. ) 40 tablet 0  . omeprazole  (PRILOSEC) 20 MG capsule Take 20 mg by mouth daily.     . pravastatin (PRAVACHOL) 40 MG tablet Take 40 mg by mouth at bedtime.     . traMADol (ULTRAM) 50 MG tablet Take 50 mg by mouth as needed.     . warfarin (COUMADIN) 5 MG tablet Take 2.5-5 mg by mouth See admin instructions. Take 5 mg by mouth daily at night on Monday and Friday. Take 2.5 mg by mouth daily at night on all other days     No current facility-administered medications on file prior to visit.    Allergies  Allergen Reactions  . Adhesive [Tape] Other (See Comments)    Tears skin  . Celebrex [Celecoxib] Other (See Comments)    Causes nose bleeds  . Diphenhydramine Other (See Comments)  . Nsaids Other (See Comments)    Causes afib    Past Medical History:  Diagnosis Date  . AF (atrial fibrillation) ([place])   . Arthritis    OA  . Cholelithiasis   . Colon polyps   . Diverticulosis   . DJD (degenerative joint disease)   . Dyslipidemia   . Fatty liver disease, nonalcoholic   . GERD (gastroesophageal reflux disease)   . Gout   . Hepatitis A AGE 41  . History of hiatal hernia    PT THINKS  .  Hypertension   . Hypothyroidism   . Obesity     Past Surgical History:  Procedure Laterality Date  . BREAST BIOPSY Bilateral    1 right, 2 left  . CHOLECYSTECTOMY  22 YRS AGO  . CYST ON CERVIX REMOVED  1965  . ERCP  20 YRS AGO  . ERCP N/A 04/12/2016   Procedure: ENDOSCOPIC RETROGRADE CHOLANGIOPANCREATOGRAPHY (ERCP);  Surgeon: Irene Shipper, MD;  Location: [facility];  Service: Endoscopy;  Laterality: N/A;  . EYE SURGERY Bilateral 2015   LASER SURGERY DONE ALSO BOTH EYES FOR FILM OVER EYES  . KNEE ARTHROSCOPY 20 YRS AGO Left   . PLANTAR FASCIA SURGERY Left   . TOENAIL EXCISION Bilateral   . TONSILLECTOMY    . TOTAL ABDOMINAL HYSTERECTOMY     with appendectomy, COMPLETE  . TOTAL KNEE ARTHROPLASTY Left 09/25/2017   Procedure: LEFT TOTAL KNEE ARTHROPLASTY;  Surgeon: Paralee Cancel, MD;  Location: [facility];  Service:  Orthopedics;  Laterality: Left;  90 mins  . TOTAL KNEE ARTHROPLASTY Right 02/27/2018   Procedure: RIGHT TOTAL KNEE ARTHROPLASTY;  Surgeon: Paralee Cancel, MD;  Location: [facility];  Service: Orthopedics;  Laterality: Right;  Adductor Block    Social History   Tobacco Use  Smoking Status Former Smoker  . Packs/day: 1.00  . Years: 25.00  . Pack years: 25.00  . Types: Cigarettes  . Quit date: 09/12/1998  . Years since quitting: 21.3  Smokeless Tobacco Never Used    Social History   Substance and Sexual Activity  Alcohol Use No  . Alcohol/week: 0.0 standard drinks    Family History  Problem Relation Age of Onset  . Diabetes Mother   . Stroke Mother   . Heart disease Father   . Diabetes Father   . Arrhythmia Sister   . Heart disease Sister        x 2  . Breast cancer Sister        x 2  . Diabetes Sister        x 2    Review of Systems: As noted in history of present illness. All other systems were reviewed and are negative.  Physical Exam: BP (!) 156/84   Pulse 79   Ht 5\' 5"  (1.651 m)   Wt 211 lb (95.7 kg)   SpO2 93%   BMI 35.11 kg/m  GENERAL:  Well appearing WF in NAD HEENT:  PERRL, EOMI, sclera are clear. Oropharynx is clear. NECK:  No jugular venous distention, carotid upstroke brisk and symmetric, no bruits, no thyromegaly or adenopathy LUNGS:  Clear to auscultation bilaterally CHEST:  Unremarkable HEART:  IRRR,  PMI not displaced or sustained,S1 and S2 within normal limits, no S3, no S4: no clicks, no rubs, no murmurs ABD:  Soft, nontender. BS +, no masses or bruits. No hepatomegaly, no splenomegaly EXT:  2 + pulses throughout, tr edema, no cyanosis no clubbing SKIN:  Warm and dry.  No rashes NEURO:  Alert and oriented x 3. Cranial nerves II through XII intact. PSYCH:  Cognitively intact    LABORATORY DATA:  Lab Results  Component Value Date   WBC 15.2 (H) 02/28/2018   HGB 9.5 (L) 02/28/2018   HCT 28.5 (L) 02/28/2018   PLT 163 02/28/2018   GLUCOSE 146  (H) 02/28/2018   ALT 30 02/22/2018   AST 31 02/22/2018   NA 141 02/28/2018   K 4.7 02/28/2018   CL 109 02/28/2018   CREATININE 1.98 (H) 02/28/2018   BUN 72 (  H) 02/28/2018   CO2 23 02/28/2018   INR 1.7 (A) 12/25/2018   Labs dated 03/03/16: cholesterol 154, triglycerides 72, LDL 86, HDL 54.  Dated 09/23/16: BUN 59, creatinine 1.5. CMET, TSH, CBC normal.  Dated 03/13/18: cholesterol 141, triglycerides 59, HDL 49, LDL 80. Hgb 10, creatinine 1.6. ALT and TSH normal. Dated 03/26/19: cholesterol 145, triglycerides 66, HDL 56, LDL 76. Creatinine 1.3. other chemistries and TSH normal.  Dated 10/04/19: creatinine 1.3, otherwise CMET, CBC, TFTs normal  Ecg today shows Afib with occ PVCs. Rate 79. Nonspecific ST changes. I have personally reviewed and interpreted this study.   Assessment / Plan: 1. Atrial fibrillation. Rate is well controlled on no medication. She is on anticoagulation with Coumadin.  Mali vasc score of 3. She is asymptomatic.  2. Morbid obesity. Encouraged  weight loss.   3. Hypercholesterolemia-on pravastatin. Controlled. Labs with primary care.  4. CKD improved off HCTZ

## 2020-01-01 DIAGNOSIS — I4821 Permanent atrial fibrillation: Secondary | ICD-10-CM | POA: Diagnosis not present

## 2020-01-01 DIAGNOSIS — Z7901 Long term (current) use of anticoagulants: Secondary | ICD-10-CM | POA: Diagnosis not present

## 2020-01-07 ENCOUNTER — Ambulatory Visit: Payer: Medicare HMO | Admitting: Cardiology

## 2020-01-07 ENCOUNTER — Other Ambulatory Visit: Payer: Self-pay

## 2020-01-07 ENCOUNTER — Encounter: Payer: Self-pay | Admitting: Cardiology

## 2020-01-07 VITALS — BP 156/84 | HR 79 | Ht 65.0 in | Wt 211.0 lb

## 2020-01-07 DIAGNOSIS — I10 Essential (primary) hypertension: Secondary | ICD-10-CM | POA: Diagnosis not present

## 2020-01-07 DIAGNOSIS — E7800 Pure hypercholesterolemia, unspecified: Secondary | ICD-10-CM | POA: Diagnosis not present

## 2020-01-07 DIAGNOSIS — I4891 Unspecified atrial fibrillation: Secondary | ICD-10-CM | POA: Diagnosis not present

## 2020-01-07 DIAGNOSIS — Z7901 Long term (current) use of anticoagulants: Secondary | ICD-10-CM | POA: Diagnosis not present

## 2020-01-07 DIAGNOSIS — I482 Chronic atrial fibrillation, unspecified: Secondary | ICD-10-CM

## 2020-02-24 ENCOUNTER — Ambulatory Visit (INDEPENDENT_AMBULATORY_CARE_PROVIDER_SITE_OTHER): Payer: Medicare HMO

## 2020-02-24 ENCOUNTER — Encounter: Payer: Self-pay | Admitting: Podiatry

## 2020-02-24 ENCOUNTER — Ambulatory Visit: Payer: Medicare HMO | Admitting: Podiatry

## 2020-02-24 ENCOUNTER — Other Ambulatory Visit: Payer: Self-pay

## 2020-02-24 ENCOUNTER — Other Ambulatory Visit: Payer: Self-pay | Admitting: Podiatry

## 2020-02-24 DIAGNOSIS — M778 Other enthesopathies, not elsewhere classified: Secondary | ICD-10-CM | POA: Diagnosis not present

## 2020-02-24 DIAGNOSIS — M722 Plantar fascial fibromatosis: Secondary | ICD-10-CM

## 2020-02-24 DIAGNOSIS — M79672 Pain in left foot: Secondary | ICD-10-CM

## 2020-02-24 NOTE — Progress Notes (Signed)
Subjective:   Patient ID: [name] [name], female   DOB: 74 y.o.   MRN: [id number]   HPI Patient states that she has a area of irritation on top of the left foot which is been bothering her for the last few months and also has concerns about her left heel which has bothered her for a fairly long period of time   ROS      Objective:  Physical Exam  Vascular status intact with patient not seen for a year and a half with inflammation pain of the dorsal tendon complex extensor left and mild to moderate discomfort plantar heel left at insertion calcaneus     Assessment:  Extensor tendinitis of the left midtarsal joint with inflammation pain of a mild nature of the plantar fascia     Plan:  H&P reviewed condition today I did sterile prep and injected the extensor complex left 3 mg Kenalog 5 mg Xylocaine and for the plantar fascial I recommended stretching exercises and supportive therapy.  Patient will be seen back to recheck

## 2020-02-25 DIAGNOSIS — I4821 Permanent atrial fibrillation: Secondary | ICD-10-CM | POA: Diagnosis not present

## 2020-02-25 DIAGNOSIS — Z7901 Long term (current) use of anticoagulants: Secondary | ICD-10-CM | POA: Diagnosis not present

## 2020-02-28 DIAGNOSIS — Z1231 Encounter for screening mammogram for malignant neoplasm of breast: Secondary | ICD-10-CM | POA: Diagnosis not present

## 2020-03-10 DIAGNOSIS — Z7901 Long term (current) use of anticoagulants: Secondary | ICD-10-CM | POA: Diagnosis not present

## 2020-03-10 DIAGNOSIS — I4821 Permanent atrial fibrillation: Secondary | ICD-10-CM | POA: Diagnosis not present

## 2020-04-01 DIAGNOSIS — I4821 Permanent atrial fibrillation: Secondary | ICD-10-CM | POA: Diagnosis not present

## 2020-04-01 DIAGNOSIS — Z7901 Long term (current) use of anticoagulants: Secondary | ICD-10-CM | POA: Diagnosis not present

## 2020-04-03 DIAGNOSIS — M109 Gout, unspecified: Secondary | ICD-10-CM | POA: Diagnosis not present

## 2020-04-03 DIAGNOSIS — E038 Other specified hypothyroidism: Secondary | ICD-10-CM | POA: Diagnosis not present

## 2020-04-03 DIAGNOSIS — Z0000 Encounter for general adult medical examination without abnormal findings: Secondary | ICD-10-CM | POA: Diagnosis not present

## 2020-04-03 DIAGNOSIS — E7800 Pure hypercholesterolemia, unspecified: Secondary | ICD-10-CM | POA: Diagnosis not present

## 2020-04-06 DIAGNOSIS — Z1212 Encounter for screening for malignant neoplasm of rectum: Secondary | ICD-10-CM | POA: Diagnosis not present

## 2020-04-10 DIAGNOSIS — K219 Gastro-esophageal reflux disease without esophagitis: Secondary | ICD-10-CM | POA: Diagnosis not present

## 2020-04-10 DIAGNOSIS — E039 Hypothyroidism, unspecified: Secondary | ICD-10-CM | POA: Diagnosis not present

## 2020-04-10 DIAGNOSIS — Z7901 Long term (current) use of anticoagulants: Secondary | ICD-10-CM | POA: Diagnosis not present

## 2020-04-10 DIAGNOSIS — K769 Liver disease, unspecified: Secondary | ICD-10-CM | POA: Diagnosis not present

## 2020-04-10 DIAGNOSIS — R82998 Other abnormal findings in urine: Secondary | ICD-10-CM | POA: Diagnosis not present

## 2020-04-10 DIAGNOSIS — M1990 Unspecified osteoarthritis, unspecified site: Secondary | ICD-10-CM | POA: Diagnosis not present

## 2020-04-10 DIAGNOSIS — Z0000 Encounter for general adult medical examination without abnormal findings: Secondary | ICD-10-CM | POA: Diagnosis not present

## 2020-04-10 DIAGNOSIS — I4821 Permanent atrial fibrillation: Secondary | ICD-10-CM | POA: Diagnosis not present

## 2020-04-10 DIAGNOSIS — I129 Hypertensive chronic kidney disease with stage 1 through stage 4 chronic kidney disease, or unspecified chronic kidney disease: Secondary | ICD-10-CM | POA: Diagnosis not present

## 2020-04-10 DIAGNOSIS — M109 Gout, unspecified: Secondary | ICD-10-CM | POA: Diagnosis not present

## 2020-04-10 DIAGNOSIS — H6123 Impacted cerumen, bilateral: Secondary | ICD-10-CM | POA: Diagnosis not present

## 2020-04-15 DIAGNOSIS — I4821 Permanent atrial fibrillation: Secondary | ICD-10-CM | POA: Diagnosis not present

## 2020-04-15 DIAGNOSIS — Z7901 Long term (current) use of anticoagulants: Secondary | ICD-10-CM | POA: Diagnosis not present

## 2020-04-15 DIAGNOSIS — D6869 Other thrombophilia: Secondary | ICD-10-CM | POA: Diagnosis not present

## 2020-05-06 DIAGNOSIS — I4821 Permanent atrial fibrillation: Secondary | ICD-10-CM | POA: Diagnosis not present

## 2020-05-06 DIAGNOSIS — D6869 Other thrombophilia: Secondary | ICD-10-CM | POA: Diagnosis not present

## 2020-05-06 DIAGNOSIS — Z7901 Long term (current) use of anticoagulants: Secondary | ICD-10-CM | POA: Diagnosis not present

## 2020-06-01 DIAGNOSIS — Z961 Presence of intraocular lens: Secondary | ICD-10-CM | POA: Diagnosis not present

## 2020-06-01 DIAGNOSIS — H524 Presbyopia: Secondary | ICD-10-CM | POA: Diagnosis not present

## 2020-06-01 DIAGNOSIS — H5203 Hypermetropia, bilateral: Secondary | ICD-10-CM | POA: Diagnosis not present

## 2020-06-10 DIAGNOSIS — D6869 Other thrombophilia: Secondary | ICD-10-CM | POA: Diagnosis not present

## 2020-06-10 DIAGNOSIS — Z23 Encounter for immunization: Secondary | ICD-10-CM | POA: Diagnosis not present

## 2020-06-10 DIAGNOSIS — I4821 Permanent atrial fibrillation: Secondary | ICD-10-CM | POA: Diagnosis not present

## 2020-06-10 DIAGNOSIS — Z7901 Long term (current) use of anticoagulants: Secondary | ICD-10-CM | POA: Diagnosis not present

## 2020-06-18 NOTE — Progress Notes (Signed)
[name] [name] Date of Birth: 1946-03-31   History of Present Illness: [name] [name] is seen for  followup of atrial fibrillation. She has a history of permanent atrial fibrillation treated with  anticoagulation with Coumadin. No need for rate control therapy since HR has been normal. Prior Holter monitor showed average HR 72. Range 38-139. Longest pause 2.5 sec.    In August 2017 she had ERCP for common duct stone. In November 2017 she had colonoscopy with findings of three polyps. In June 2019 she had left TKR then in June 2019 right TKR.  She was seen in April 2019 after 2 syncopal episodes. She was found to have orthostatic hypotension and lisinopril HCT was stopped. On follow up she has had no further syncope.  On follow up today she is doing well. She denies any dizziness, syncope, dyspnea, chest pain. No change in ankle swelling. Feels well.   Current Outpatient Medications on File Prior to Visit  Medication Sig Dispense Refill  . allopurinol (ZYLOPRIM) 300 MG tablet Take 300 mg by mouth daily.     Marland Kitchen COENZYME Q10 PO Take 1 capsule by mouth daily.     . fexofenadine (ALLEGRA) 180 MG tablet Take 180 mg by mouth daily.     . furosemide (LASIX) 20 MG tablet Take 40 mg by mouth daily.    . Investigational - Study Medication Take 3 tablets by mouth daily. Study name: Coco and multi vitamin    . levothyroxine (SYNTHROID, LEVOTHROID) 100 MCG tablet Take 100 mcg by mouth daily before breakfast.    . Liniments (SALONPAS PAIN RELIEF PATCH EX) Apply 1 patch topically daily as needed (pain).    Marland Kitchen LINZESS 72 MCG capsule Take 1 capsule by mouth as needed.     . methocarbamol (ROBAXIN) 500 MG tablet Take 1 tablet (500 mg total) by mouth every 6 (six) hours as needed for muscle spasms. (Patient taking differently: Take 500 mg by mouth daily. ) 40 tablet 0  . omeprazole (PRILOSEC) 20 MG capsule Take 20 mg by mouth daily.     . pravastatin (PRAVACHOL) 40 MG tablet Take 40 mg by mouth at bedtime.     .  traMADol (ULTRAM) 50 MG tablet Take 50 mg by mouth as needed.     . warfarin (COUMADIN) 5 MG tablet Take 2.5-5 mg by mouth See admin instructions. Take 5 mg by mouth daily at night on Monday and Friday. Take 2.5 mg by mouth daily at night on all other days     No current facility-administered medications on file prior to visit.    Allergies  Allergen Reactions  . Adhesive [Tape] Other (See Comments)    Tears skin  . Celebrex [Celecoxib] Other (See Comments)    Causes nose bleeds  . Diphenhydramine Other (See Comments)  . Nsaids Other (See Comments)    Causes afib    Past Medical History:  Diagnosis Date  . AF (atrial fibrillation) ([place])   . Arthritis    OA  . Cholelithiasis   . Colon polyps   . Diverticulosis   . DJD (degenerative joint disease)   . Dyslipidemia   . Fatty liver disease, nonalcoholic   . GERD (gastroesophageal reflux disease)   . Gout   . Hepatitis A AGE 15  . History of hiatal hernia    PT THINKS  . Hypertension   . Hypothyroidism   . Obesity     Past Surgical History:  Procedure Laterality Date  . BREAST BIOPSY  Bilateral    1 right, 2 left  . CHOLECYSTECTOMY  22 YRS AGO  . CYST ON CERVIX REMOVED  1965  . ERCP  20 YRS AGO  . ERCP N/A 04/12/2016   Procedure: ENDOSCOPIC RETROGRADE CHOLANGIOPANCREATOGRAPHY (ERCP);  Surgeon: Irene Shipper, MD;  Location: [facility];  Service: Endoscopy;  Laterality: N/A;  . EYE SURGERY Bilateral 2015   LASER SURGERY DONE ALSO BOTH EYES FOR FILM OVER EYES  . KNEE ARTHROSCOPY 20 YRS AGO Left   . PLANTAR FASCIA SURGERY Left   . TOENAIL EXCISION Bilateral   . TONSILLECTOMY    . TOTAL ABDOMINAL HYSTERECTOMY     with appendectomy, COMPLETE  . TOTAL KNEE ARTHROPLASTY Left 09/25/2017   Procedure: LEFT TOTAL KNEE ARTHROPLASTY;  Surgeon: Paralee Cancel, MD;  Location: [facility];  Service: Orthopedics;  Laterality: Left;  90 mins  . TOTAL KNEE ARTHROPLASTY Right 02/27/2018   Procedure: RIGHT TOTAL KNEE ARTHROPLASTY;  Surgeon:  Paralee Cancel, MD;  Location: [facility];  Service: Orthopedics;  Laterality: Right;  Adductor Block    Social History   Tobacco Use  Smoking Status Former Smoker  . Packs/day: 1.00  . Years: 25.00  . Pack years: 25.00  . Types: Cigarettes  . Quit date: 09/12/1998  . Years since quitting: 21.7  Smokeless Tobacco Never Used    Social History   Substance and Sexual Activity  Alcohol Use No  . Alcohol/week: 0.0 standard drinks    Family History  Problem Relation Age of Onset  . Diabetes Mother   . Stroke Mother   . Heart disease Father   . Diabetes Father   . Arrhythmia Sister   . Heart disease Sister        x 2  . Breast cancer Sister        x 2  . Diabetes Sister        x 2    Review of Systems: As noted in history of present illness. All other systems were reviewed and are negative.  Physical Exam: BP 140/76 (BP Location: Right Arm, Patient Position: Sitting, Cuff Size: Large)   Pulse 70   Ht 5\' 4"  (1.626 m)   Wt 208 lb 3.2 oz (94.4 kg)   BMI 35.74 kg/m  GENERAL:  Well appearing WF in NAD HEENT:  PERRL, EOMI, sclera are clear. Oropharynx is clear. NECK:  No jugular venous distention, carotid upstroke brisk and symmetric, no bruits, no thyromegaly or adenopathy LUNGS:  Clear to auscultation bilaterally CHEST:  Unremarkable HEART:  IRRR,  PMI not displaced or sustained,S1 and S2 within normal limits, no S3, no S4: no clicks, no rubs, no murmurs ABD:  Soft, nontender. BS +, no masses or bruits. No hepatomegaly, no splenomegaly EXT:  2 + pulses throughout, tr edema, no cyanosis no clubbing SKIN:  Warm and dry.  No rashes NEURO:  Alert and oriented x 3. Cranial nerves II through XII intact. PSYCH:  Cognitively intact    LABORATORY DATA:  Lab Results  Component Value Date   WBC 15.2 (H) 02/28/2018   HGB 9.5 (L) 02/28/2018   HCT 28.5 (L) 02/28/2018   PLT 163 02/28/2018   GLUCOSE 146 (H) 02/28/2018   ALT 30 02/22/2018   AST 31 02/22/2018   NA 141 02/28/2018    K 4.7 02/28/2018   CL 109 02/28/2018   CREATININE 1.98 (H) 02/28/2018   BUN 72 (H) 02/28/2018   CO2 23 02/28/2018   INR 1.7 (A) 12/25/2018   Labs dated  03/03/16: cholesterol 154, triglycerides 72, LDL 86, HDL 54.  Dated 09/23/16: BUN 59, creatinine 1.5. CMET, TSH, CBC normal.  Dated 03/13/18: cholesterol 141, triglycerides 59, HDL 49, LDL 80. Hgb 10, creatinine 1.6. ALT and TSH normal. Dated 03/26/19: cholesterol 145, triglycerides 66, HDL 56, LDL 76. Creatinine 1.3. other chemistries and TSH normal.  Dated 10/04/19: creatinine 1.3, otherwise CMET, CBC, TFTs normal Dated 04/03/20: cholesterol 144, triglycerides 63, HDL 59, LDL 72. Creatinine 1.4. otherwise CMET and CBC normal   Assessment / Plan: 1. Atrial fibrillation. Rate is well controlled on no medication. She is on anticoagulation with Coumadin.  Mali vasc score of 3. She is asymptomatic.  2. Morbid obesity. Encouraged  weight loss.   3. Hypercholesterolemia-on pravastatin. Controlled.   4. CKD stable.   Follow up in one year

## 2020-06-23 ENCOUNTER — Ambulatory Visit: Payer: Medicare HMO | Admitting: Cardiology

## 2020-06-23 ENCOUNTER — Other Ambulatory Visit: Payer: Self-pay

## 2020-06-23 ENCOUNTER — Encounter: Payer: Self-pay | Admitting: Cardiology

## 2020-06-23 VITALS — BP 140/76 | HR 70 | Ht 64.0 in | Wt 208.2 lb

## 2020-06-23 DIAGNOSIS — I482 Chronic atrial fibrillation, unspecified: Secondary | ICD-10-CM

## 2020-06-23 DIAGNOSIS — I10 Essential (primary) hypertension: Secondary | ICD-10-CM | POA: Diagnosis not present

## 2020-06-23 DIAGNOSIS — E7800 Pure hypercholesterolemia, unspecified: Secondary | ICD-10-CM | POA: Diagnosis not present

## 2020-07-16 DIAGNOSIS — Z7901 Long term (current) use of anticoagulants: Secondary | ICD-10-CM | POA: Diagnosis not present

## 2020-07-16 DIAGNOSIS — D6869 Other thrombophilia: Secondary | ICD-10-CM | POA: Diagnosis not present

## 2020-07-16 DIAGNOSIS — I4821 Permanent atrial fibrillation: Secondary | ICD-10-CM | POA: Diagnosis not present

## 2020-08-05 DIAGNOSIS — M25561 Pain in right knee: Secondary | ICD-10-CM | POA: Diagnosis not present

## 2020-08-05 DIAGNOSIS — M25562 Pain in left knee: Secondary | ICD-10-CM | POA: Diagnosis not present

## 2020-08-19 DIAGNOSIS — Z7901 Long term (current) use of anticoagulants: Secondary | ICD-10-CM | POA: Diagnosis not present

## 2020-08-19 DIAGNOSIS — D6869 Other thrombophilia: Secondary | ICD-10-CM | POA: Diagnosis not present

## 2020-08-19 DIAGNOSIS — I4821 Permanent atrial fibrillation: Secondary | ICD-10-CM | POA: Diagnosis not present

## 2020-09-22 DIAGNOSIS — D6869 Other thrombophilia: Secondary | ICD-10-CM | POA: Diagnosis not present

## 2020-09-22 DIAGNOSIS — Z7901 Long term (current) use of anticoagulants: Secondary | ICD-10-CM | POA: Diagnosis not present

## 2020-09-22 DIAGNOSIS — I4821 Permanent atrial fibrillation: Secondary | ICD-10-CM | POA: Diagnosis not present

## 2020-10-20 DIAGNOSIS — E039 Hypothyroidism, unspecified: Secondary | ICD-10-CM | POA: Diagnosis not present

## 2020-10-20 DIAGNOSIS — E669 Obesity, unspecified: Secondary | ICD-10-CM | POA: Diagnosis not present

## 2020-10-20 DIAGNOSIS — I77810 Thoracic aortic ectasia: Secondary | ICD-10-CM | POA: Diagnosis not present

## 2020-10-20 DIAGNOSIS — E7800 Pure hypercholesterolemia, unspecified: Secondary | ICD-10-CM | POA: Diagnosis not present

## 2020-10-20 DIAGNOSIS — Z7901 Long term (current) use of anticoagulants: Secondary | ICD-10-CM | POA: Diagnosis not present

## 2020-10-20 DIAGNOSIS — M109 Gout, unspecified: Secondary | ICD-10-CM | POA: Diagnosis not present

## 2020-10-20 DIAGNOSIS — I129 Hypertensive chronic kidney disease with stage 1 through stage 4 chronic kidney disease, or unspecified chronic kidney disease: Secondary | ICD-10-CM | POA: Diagnosis not present

## 2020-10-20 DIAGNOSIS — D6869 Other thrombophilia: Secondary | ICD-10-CM | POA: Diagnosis not present

## 2020-10-20 DIAGNOSIS — N184 Chronic kidney disease, stage 4 (severe): Secondary | ICD-10-CM | POA: Diagnosis not present

## 2020-10-20 DIAGNOSIS — I4821 Permanent atrial fibrillation: Secondary | ICD-10-CM | POA: Diagnosis not present

## 2020-11-24 DIAGNOSIS — Z7901 Long term (current) use of anticoagulants: Secondary | ICD-10-CM | POA: Diagnosis not present

## 2020-11-24 DIAGNOSIS — D6869 Other thrombophilia: Secondary | ICD-10-CM | POA: Diagnosis not present

## 2020-11-24 DIAGNOSIS — I4821 Permanent atrial fibrillation: Secondary | ICD-10-CM | POA: Diagnosis not present

## 2020-12-29 DIAGNOSIS — Z7901 Long term (current) use of anticoagulants: Secondary | ICD-10-CM | POA: Diagnosis not present

## 2020-12-29 DIAGNOSIS — I4821 Permanent atrial fibrillation: Secondary | ICD-10-CM | POA: Diagnosis not present

## 2020-12-29 DIAGNOSIS — D6869 Other thrombophilia: Secondary | ICD-10-CM | POA: Diagnosis not present

## 2021-02-01 DIAGNOSIS — Z7901 Long term (current) use of anticoagulants: Secondary | ICD-10-CM | POA: Diagnosis not present

## 2021-02-01 DIAGNOSIS — I4821 Permanent atrial fibrillation: Secondary | ICD-10-CM | POA: Diagnosis not present

## 2021-02-01 DIAGNOSIS — D6869 Other thrombophilia: Secondary | ICD-10-CM | POA: Diagnosis not present

## 2021-02-24 DIAGNOSIS — Z7901 Long term (current) use of anticoagulants: Secondary | ICD-10-CM | POA: Diagnosis not present

## 2021-02-24 DIAGNOSIS — D6869 Other thrombophilia: Secondary | ICD-10-CM | POA: Diagnosis not present

## 2021-02-24 DIAGNOSIS — I4821 Permanent atrial fibrillation: Secondary | ICD-10-CM | POA: Diagnosis not present

## 2021-03-05 DIAGNOSIS — Z1231 Encounter for screening mammogram for malignant neoplasm of breast: Secondary | ICD-10-CM | POA: Diagnosis not present

## 2021-03-30 DIAGNOSIS — Z7901 Long term (current) use of anticoagulants: Secondary | ICD-10-CM | POA: Diagnosis not present

## 2021-03-30 DIAGNOSIS — D6869 Other thrombophilia: Secondary | ICD-10-CM | POA: Diagnosis not present

## 2021-03-30 DIAGNOSIS — I4821 Permanent atrial fibrillation: Secondary | ICD-10-CM | POA: Diagnosis not present

## 2021-04-16 DIAGNOSIS — M109 Gout, unspecified: Secondary | ICD-10-CM | POA: Diagnosis not present

## 2021-04-16 DIAGNOSIS — E7800 Pure hypercholesterolemia, unspecified: Secondary | ICD-10-CM | POA: Diagnosis not present

## 2021-04-16 DIAGNOSIS — E039 Hypothyroidism, unspecified: Secondary | ICD-10-CM | POA: Diagnosis not present

## 2021-04-27 DIAGNOSIS — I4821 Permanent atrial fibrillation: Secondary | ICD-10-CM | POA: Diagnosis not present

## 2021-04-27 DIAGNOSIS — N184 Chronic kidney disease, stage 4 (severe): Secondary | ICD-10-CM | POA: Diagnosis not present

## 2021-04-27 DIAGNOSIS — D692 Other nonthrombocytopenic purpura: Secondary | ICD-10-CM | POA: Diagnosis not present

## 2021-04-27 DIAGNOSIS — Z0000 Encounter for general adult medical examination without abnormal findings: Secondary | ICD-10-CM | POA: Diagnosis not present

## 2021-04-27 DIAGNOSIS — I129 Hypertensive chronic kidney disease with stage 1 through stage 4 chronic kidney disease, or unspecified chronic kidney disease: Secondary | ICD-10-CM | POA: Diagnosis not present

## 2021-04-27 DIAGNOSIS — Z7901 Long term (current) use of anticoagulants: Secondary | ICD-10-CM | POA: Diagnosis not present

## 2021-04-27 DIAGNOSIS — E7800 Pure hypercholesterolemia, unspecified: Secondary | ICD-10-CM | POA: Diagnosis not present

## 2021-04-27 DIAGNOSIS — E039 Hypothyroidism, unspecified: Secondary | ICD-10-CM | POA: Diagnosis not present

## 2021-04-27 DIAGNOSIS — E669 Obesity, unspecified: Secondary | ICD-10-CM | POA: Diagnosis not present

## 2021-04-28 DIAGNOSIS — R82998 Other abnormal findings in urine: Secondary | ICD-10-CM | POA: Diagnosis not present

## 2021-05-10 DIAGNOSIS — Z1212 Encounter for screening for malignant neoplasm of rectum: Secondary | ICD-10-CM | POA: Diagnosis not present

## 2021-06-01 DIAGNOSIS — H5212 Myopia, left eye: Secondary | ICD-10-CM | POA: Diagnosis not present

## 2021-06-01 DIAGNOSIS — Z961 Presence of intraocular lens: Secondary | ICD-10-CM | POA: Diagnosis not present

## 2021-06-01 DIAGNOSIS — H524 Presbyopia: Secondary | ICD-10-CM | POA: Diagnosis not present

## 2021-06-01 DIAGNOSIS — H52203 Unspecified astigmatism, bilateral: Secondary | ICD-10-CM | POA: Diagnosis not present

## 2021-06-02 DIAGNOSIS — Z23 Encounter for immunization: Secondary | ICD-10-CM | POA: Diagnosis not present

## 2021-06-02 DIAGNOSIS — D6869 Other thrombophilia: Secondary | ICD-10-CM | POA: Diagnosis not present

## 2021-06-02 DIAGNOSIS — I4821 Permanent atrial fibrillation: Secondary | ICD-10-CM | POA: Diagnosis not present

## 2021-06-02 DIAGNOSIS — Z7901 Long term (current) use of anticoagulants: Secondary | ICD-10-CM | POA: Diagnosis not present

## 2021-07-01 DIAGNOSIS — Z7901 Long term (current) use of anticoagulants: Secondary | ICD-10-CM | POA: Diagnosis not present

## 2021-07-01 DIAGNOSIS — I4821 Permanent atrial fibrillation: Secondary | ICD-10-CM | POA: Diagnosis not present

## 2021-07-01 DIAGNOSIS — D6869 Other thrombophilia: Secondary | ICD-10-CM | POA: Diagnosis not present

## 2021-07-27 DIAGNOSIS — Z7901 Long term (current) use of anticoagulants: Secondary | ICD-10-CM | POA: Diagnosis not present

## 2021-07-27 DIAGNOSIS — D6869 Other thrombophilia: Secondary | ICD-10-CM | POA: Diagnosis not present

## 2021-07-27 DIAGNOSIS — I4821 Permanent atrial fibrillation: Secondary | ICD-10-CM | POA: Diagnosis not present

## 2021-07-29 ENCOUNTER — Encounter: Payer: Self-pay | Admitting: Internal Medicine

## 2021-08-26 DIAGNOSIS — Z7901 Long term (current) use of anticoagulants: Secondary | ICD-10-CM | POA: Diagnosis not present

## 2021-08-26 DIAGNOSIS — D6869 Other thrombophilia: Secondary | ICD-10-CM | POA: Diagnosis not present

## 2021-08-26 DIAGNOSIS — I4821 Permanent atrial fibrillation: Secondary | ICD-10-CM | POA: Diagnosis not present

## 2021-08-26 NOTE — Progress Notes (Signed)
[name] [name] Date of Birth: [birth year]   History of Present Illness: [name] [name] is seen for  followup of atrial fibrillation. She has a history of permanent atrial fibrillation treated with  anticoagulation with Coumadin. No need for rate control therapy since HR has been normal. Prior Holter monitor showed average HR 72. Range 38-139. Longest pause 2.5 sec.    In August 2017 she had ERCP for common duct stone. In November 2017 she had colonoscopy with findings of three polyps. In June 2019 she had left TKR then in June 2019 right TKR.  She was seen in April 2019 after 2 syncopal episodes. She was found to have orthostatic hypotension and lisinopril HCT was stopped. On follow up she has had no further syncope.  On follow up today she is doing OK. States that she is stressed to the max. Caring for sister with Alzhiemers and husband who has lung Ca. . She denies any dizziness, syncope, dyspnea, chest pain. No change in ankle swelling. Feels well. Notes she has gained 7-9 lbs.   Current Outpatient Medications on File Prior to Visit  Medication Sig Dispense Refill   acetaminophen (TYLENOL) 650 MG CR tablet Tylenol     allopurinol (ZYLOPRIM) 300 MG tablet Take 300 mg by mouth daily.      COENZYME Q10 PO Take 1 capsule by mouth daily.      fexofenadine (ALLEGRA) 180 MG tablet Take 180 mg by mouth daily.      furosemide (LASIX) 20 MG tablet Take 40 mg by mouth daily.     levothyroxine (SYNTHROID, LEVOTHROID) 100 MCG tablet Take 100 mcg by mouth daily before breakfast.     Liniments (SALONPAS PAIN RELIEF PATCH EX) Apply 1 patch topically daily as needed (pain).     LINZESS 72 MCG capsule Take 1 capsule by mouth as needed.      methocarbamol (ROBAXIN) 500 MG tablet Take 1 tablet (500 mg total) by mouth every 6 (six) hours as needed for muscle spasms. (Patient taking differently: Take 500 mg by mouth daily.) 40 tablet 0   omeprazole (PRILOSEC) 20 MG capsule Take 20 mg by mouth daily.       pravastatin (PRAVACHOL) 40 MG tablet Take 40 mg by mouth at bedtime.      traMADol (ULTRAM) 50 MG tablet Take 50 mg by mouth as needed.      warfarin (COUMADIN) 5 MG tablet Take 2.5-5 mg by mouth See admin instructions. Take 5 mg by mouth daily at night on Monday and Friday. Take 2.5 mg by mouth daily at night on all other days     Investigational - Study Medication Take 3 tablets by mouth daily. Study name: [name] [name] and multi vitamin (Patient not taking: Reported on 08/30/2021)     No current facility-administered medications on file prior to visit.    Allergies  Allergen Reactions   Adhesive [Tape] Other (See Comments)    Tears skin   Celebrex [Celecoxib] Other (See Comments)    Causes nose bleeds   Diphenhydramine Other (See Comments)   Nsaids Other (See Comments)    Causes afib    Past Medical History:  Diagnosis Date   AF (atrial fibrillation) ([facility])    Arthritis    OA   Cholelithiasis    Colon polyps    Diverticulosis    DJD (degenerative joint disease)    Dyslipidemia    Fatty liver disease, nonalcoholic    GERD (gastroesophageal reflux disease)    Gout  Hepatitis A AGE 52   History of hiatal hernia    PT THINKS   Hypertension    Hypothyroidism    Obesity     Past Surgical History:  Procedure Laterality Date   BREAST BIOPSY Bilateral    1 right, 2 left   CHOLECYSTECTOMY  22 YRS AGO   CYST ON CERVIX REMOVED  1965   ERCP  20 YRS AGO   ERCP N/A 04/12/2016   Procedure: ENDOSCOPIC RETROGRADE CHOLANGIOPANCREATOGRAPHY (ERCP);  Surgeon: [name] Shipper, MD;  [name];  Service: Endoscopy;  Laterality: N/A;   EYE SURGERY Bilateral 2015   LASER SURGERY DONE ALSO BOTH EYES FOR FILM OVER EYES   KNEE ARTHROSCOPY 20 YRS AGO Left    PLANTAR FASCIA SURGERY Left    TOENAIL EXCISION Bilateral    TONSILLECTOMY     TOTAL ABDOMINAL HYSTERECTOMY     with appendectomy, COMPLETE   TOTAL KNEE ARTHROPLASTY Left 09/25/2017   Procedure: LEFT TOTAL KNEE ARTHROPLASTY;   Surgeon: [name] Cancel, MD;  [facility]: [name];  Service: Orthopedics;  Laterality: Left;  90 mins   TOTAL KNEE ARTHROPLASTY Right 02/27/2018   Procedure: RIGHT TOTAL KNEE ARTHROPLASTY;  Surgeon: [name] Cancel, MD;  [facility]: [name];  Service: Orthopedics;  Laterality: Right;  Adductor [name]    Social History   Tobacco Use  Smoking Status Former   Packs/day: 1.00   Years: 25.00   Pack years: 25.00   Types: Cigarettes   Quit date: 09/12/1998   Years since quitting: 22.9  Smokeless Tobacco Never    Social History   Substance and Sexual Activity  Alcohol Use No   Alcohol/week: 0.0 standard drinks    Family History  Problem Relation Age of Onset   Diabetes Mother    Stroke Mother    Heart disease Father    Diabetes Father    Arrhythmia Sister    Heart disease Sister        x 2   Breast cancer Sister        x 2   Diabetes Sister        x 2    Review of Systems: As noted in history of present illness. All other systems were reviewed and are negative.  Physical Exam: BP 140/76 (BP Location: Left Arm, Cuff Size: Large)    Pulse 60    Ht 5\' 5"  (1.651 m)    Wt 217 lb 3.2 oz (98.5 kg)    SpO2 98%    BMI 36.14 kg/m  GENERAL:  Well appearing WF in NAD HEENT:  PERRL, EOMI, sclera are clear. Oropharynx is clear. NECK:  No jugular venous distention, carotid upstroke brisk and symmetric, no bruits, no thyromegaly or adenopathy LUNGS:  Clear to auscultation bilaterally CHEST:  Unremarkable HEART:  IRRR,  PMI not displaced or sustained,S1 and S2 within normal limits, no S3, no S4: no clicks, no rubs, no murmurs ABD:  Soft, nontender. BS +, no masses or bruits. No hepatomegaly, no splenomegaly EXT:  2 + pulses throughout, tr edema, no cyanosis no clubbing SKIN:  Warm and dry.  No rashes NEURO:  Alert and oriented x 3. Cranial nerves II through XII intact. PSYCH:  Cognitively intact    LABORATORY DATA:  Lab Results  Component Value Date   WBC 15.2 (H) 02/28/2018   HGB 9.5 (L)  02/28/2018   HCT 28.5 (L) 02/28/2018   PLT 163 02/28/2018   GLUCOSE 146 (H) 02/28/2018   ALT 30 02/22/2018  AST 31 02/22/2018   NA 141 02/28/2018   K 4.7 02/28/2018   CL 109 02/28/2018   CREATININE 1.98 (H) 02/28/2018   BUN 72 (H) 02/28/2018   CO2 23 02/28/2018   INR 1.7 (A) 12/25/2018   Labs dated 03/03/16: cholesterol 154, triglycerides 72, LDL 86, HDL 54.  Dated 09/23/16: BUN 59, creatinine 1.5. CMET, TSH, CBC normal.  Dated 03/13/18: cholesterol 141, triglycerides 59, HDL 49, LDL 80. Hgb 10, creatinine 1.6. ALT and TSH normal. Dated 03/26/19: cholesterol 145, triglycerides 66, HDL 56, LDL 76. Creatinine 1.3. other chemistries and TSH normal.  Dated 10/04/19: creatinine 1.3, otherwise CMET, CBC, TFTs normal Dated 04/03/20: cholesterol 144, triglycerides 63, HDL 59, LDL 72. Creatinine 1.4. otherwise CMET and CBC normal Dated 10/20/20: CMET and TFTs normal. Dated 04/16/21: BUn 35, creatinine 1.3. otherwise CMET and CBC normal. Cholesterol 151, triglycerides 79, HDL 56, LDL 79. TSH 0.3. free T4 1.0.   Ecg today shows Afib with rate 60. Low voltage. I have personally reviewed and interpreted this study.   Assessment / Plan: 1. Atrial fibrillation. Rate is well controlled on no medication. She is on anticoagulation with Coumadin.  Mali vasc score of 3. She is asymptomatic.  2. Morbid obesity. Encouraged  weight loss.   3. Hypercholesterolemia-on pravastatin. Controlled.   4. CKD stable.   Follow up in one year

## 2021-08-30 ENCOUNTER — Encounter: Payer: Self-pay | Admitting: Cardiology

## 2021-08-30 ENCOUNTER — Ambulatory Visit: Payer: Medicare HMO | Admitting: Cardiology

## 2021-08-30 ENCOUNTER — Other Ambulatory Visit: Payer: Self-pay

## 2021-08-30 ENCOUNTER — Ambulatory Visit: Payer: Medicare HMO | Admitting: Podiatry

## 2021-08-30 VITALS — BP 140/76 | HR 60 | Ht 65.0 in | Wt 217.2 lb

## 2021-08-30 DIAGNOSIS — Z7901 Long term (current) use of anticoagulants: Secondary | ICD-10-CM

## 2021-08-30 DIAGNOSIS — I482 Chronic atrial fibrillation, unspecified: Secondary | ICD-10-CM | POA: Diagnosis not present

## 2021-08-30 DIAGNOSIS — I10 Essential (primary) hypertension: Secondary | ICD-10-CM

## 2021-08-30 DIAGNOSIS — E7800 Pure hypercholesterolemia, unspecified: Secondary | ICD-10-CM

## 2021-08-30 DIAGNOSIS — I4891 Unspecified atrial fibrillation: Secondary | ICD-10-CM | POA: Diagnosis not present

## 2021-08-31 ENCOUNTER — Encounter: Payer: Self-pay | Admitting: Podiatry

## 2021-08-31 ENCOUNTER — Ambulatory Visit: Payer: Medicare HMO | Admitting: Podiatry

## 2021-08-31 DIAGNOSIS — B351 Tinea unguium: Secondary | ICD-10-CM | POA: Diagnosis not present

## 2021-08-31 DIAGNOSIS — M79675 Pain in left toe(s): Secondary | ICD-10-CM | POA: Diagnosis not present

## 2021-08-31 DIAGNOSIS — M79674 Pain in right toe(s): Secondary | ICD-10-CM

## 2021-08-31 DIAGNOSIS — D6859 Other primary thrombophilia: Secondary | ICD-10-CM

## 2021-08-31 DIAGNOSIS — Z8616 Personal history of COVID-19: Secondary | ICD-10-CM | POA: Insufficient documentation

## 2021-08-31 DIAGNOSIS — L603 Nail dystrophy: Secondary | ICD-10-CM | POA: Insufficient documentation

## 2021-08-31 DIAGNOSIS — L84 Corns and callosities: Secondary | ICD-10-CM | POA: Diagnosis not present

## 2021-09-07 NOTE — Progress Notes (Signed)
°  Subjective:  Patient ID: [name] [name], female    DOB: [birth year],  MRN: [id number]  [name] [name] presents to clinic today for at risk foot care with h/o clotting disorder and painful elongated mycotic toenails 1-5 bilaterally which are tender when wearing enclosed shoe gear. Pain is relieved with periodic professional debridement.  Patient relates no new pedal concerns on today's visit.  PCP is Tisovec, Fransico Him, MD , and last visit was 07/01/2021.  Allergies  Allergen Reactions   Adhesive [Tape] Other (See Comments)    Tears skin   Celebrex [Celecoxib] Other (See Comments)    Causes nose bleeds   Diphenhydramine Other (See Comments)   Nsaids Other (See Comments)    Causes afib    Review of Systems: Negative except as noted in the HPI. Objective:   Constitutional [name] [name] is a pleasant 75 y.o. Caucasian female, in NAD. AAO x 3.   Vascular CFT immediate b/l LE. Palpable DP/PT pulses b/l LE. Digital hair present b/l. Skin temperature gradient WNL b/l. No pain with calf compression b/l. No edema noted b/l. No cyanosis or clubbing noted b/l LE.  Neurologic Normal speech. Oriented to person, place, and time. Protective sensation intact 5/5 intact bilaterally with 10g monofilament b/l.  Dermatologic Pedal integument with normal turgor, texture and tone BLE. Toenails 2-5 bilaterally and R hallux elongated, discolored, dystrophic, thickened, and crumbly with subungual debris and tenderness to dorsal palpation. Anonychia noted L hallux. Nailbed(s) epithelialized.  Hyperkeratotic lesion(s) bilateral great toes and submet head 5 b/l.  No erythema, no edema, no drainage, no fluctuance.  Orthopedic: Normal muscle strength 5/5 to all lower extremity muscle groups bilaterally. No pain, crepitus or joint limitation noted with ROM b/l LE. No gross bony pedal deformities b/l. Patient ambulates independently without assistive aids.   Radiographs: None   Assessment:   1. Pain due to  onychomycosis of toenails of both feet   2. Callus   3. Hypercoagulable state ([place])    Plan:  Patient was evaluated and treated and all questions answered. Consent given for treatment as described below: -Patient to continue soft, supportive shoe gear daily. -Mycotic toenails 2-5 bilaterally and R hallux were debrided in length and girth with sterile nail nippers and dremel without iatrogenic bleeding. -Callus(es) bilateral great toes and submet head 5 b/l pared utilizing sterile scalpel blade without complication or incident. Total number debrided =4. -Patient/POA to call should there be question/concern in the interim.  Return in about 3 months (around 11/29/2021).  Marzetta Board, DPM

## 2021-10-06 DIAGNOSIS — I4821 Permanent atrial fibrillation: Secondary | ICD-10-CM | POA: Diagnosis not present

## 2021-10-06 DIAGNOSIS — Z7901 Long term (current) use of anticoagulants: Secondary | ICD-10-CM | POA: Diagnosis not present

## 2021-10-06 DIAGNOSIS — D6869 Other thrombophilia: Secondary | ICD-10-CM | POA: Diagnosis not present

## 2021-10-28 DIAGNOSIS — I77810 Thoracic aortic ectasia: Secondary | ICD-10-CM | POA: Diagnosis not present

## 2021-10-28 DIAGNOSIS — N184 Chronic kidney disease, stage 4 (severe): Secondary | ICD-10-CM | POA: Diagnosis not present

## 2021-10-28 DIAGNOSIS — I129 Hypertensive chronic kidney disease with stage 1 through stage 4 chronic kidney disease, or unspecified chronic kidney disease: Secondary | ICD-10-CM | POA: Diagnosis not present

## 2021-10-28 DIAGNOSIS — D692 Other nonthrombocytopenic purpura: Secondary | ICD-10-CM | POA: Diagnosis not present

## 2021-10-28 DIAGNOSIS — Z7901 Long term (current) use of anticoagulants: Secondary | ICD-10-CM | POA: Diagnosis not present

## 2021-10-28 DIAGNOSIS — Z1331 Encounter for screening for depression: Secondary | ICD-10-CM | POA: Diagnosis not present

## 2021-10-28 DIAGNOSIS — E7800 Pure hypercholesterolemia, unspecified: Secondary | ICD-10-CM | POA: Diagnosis not present

## 2021-10-28 DIAGNOSIS — Z23 Encounter for immunization: Secondary | ICD-10-CM | POA: Diagnosis not present

## 2021-10-28 DIAGNOSIS — D6869 Other thrombophilia: Secondary | ICD-10-CM | POA: Diagnosis not present

## 2021-10-28 DIAGNOSIS — E039 Hypothyroidism, unspecified: Secondary | ICD-10-CM | POA: Diagnosis not present

## 2021-10-28 DIAGNOSIS — I4821 Permanent atrial fibrillation: Secondary | ICD-10-CM | POA: Diagnosis not present

## 2021-10-28 DIAGNOSIS — I509 Heart failure, unspecified: Secondary | ICD-10-CM | POA: Diagnosis not present

## 2021-10-28 DIAGNOSIS — M109 Gout, unspecified: Secondary | ICD-10-CM | POA: Diagnosis not present

## 2021-10-28 DIAGNOSIS — E669 Obesity, unspecified: Secondary | ICD-10-CM | POA: Diagnosis not present

## 2021-11-25 DIAGNOSIS — Z7901 Long term (current) use of anticoagulants: Secondary | ICD-10-CM | POA: Diagnosis not present

## 2021-11-25 DIAGNOSIS — I4821 Permanent atrial fibrillation: Secondary | ICD-10-CM | POA: Diagnosis not present

## 2021-11-25 DIAGNOSIS — D6869 Other thrombophilia: Secondary | ICD-10-CM | POA: Diagnosis not present

## 2021-11-25 DIAGNOSIS — I129 Hypertensive chronic kidney disease with stage 1 through stage 4 chronic kidney disease, or unspecified chronic kidney disease: Secondary | ICD-10-CM | POA: Diagnosis not present

## 2021-12-07 ENCOUNTER — Ambulatory Visit: Payer: Medicare HMO | Admitting: Podiatry

## 2021-12-08 ENCOUNTER — Encounter: Payer: Self-pay | Admitting: Podiatry

## 2021-12-08 ENCOUNTER — Ambulatory Visit: Payer: Medicare HMO | Admitting: Podiatry

## 2021-12-08 ENCOUNTER — Other Ambulatory Visit: Payer: Self-pay

## 2021-12-08 DIAGNOSIS — M79675 Pain in left toe(s): Secondary | ICD-10-CM | POA: Diagnosis not present

## 2021-12-08 DIAGNOSIS — M79674 Pain in right toe(s): Secondary | ICD-10-CM

## 2021-12-08 DIAGNOSIS — L84 Corns and callosities: Secondary | ICD-10-CM

## 2021-12-08 DIAGNOSIS — B351 Tinea unguium: Secondary | ICD-10-CM

## 2021-12-08 DIAGNOSIS — D6859 Other primary thrombophilia: Secondary | ICD-10-CM

## 2021-12-12 NOTE — Progress Notes (Signed)
?  Subjective:  ?Patient ID: [name] [name], female    DOB: 28-Dec-1945,  MRN: [id number] ? ?[name] [name] presents to clinic today for at risk foot care with h/o clotting disorder and callus(es) b/l lower extremities and painful thick toenails that are difficult to trim. Painful toenails interfere with ambulation. Aggravating factors include wearing enclosed shoe gear. Pain is relieved with periodic professional debridement. Painful calluses are aggravated when weightbearing with and without shoegear. Pain is relieved with periodic professional debridement. ? ?New problem(s): None.  ? ?PCP is Tisovec, Fransico Him, MD , and last visit was October 28, 2021. ? ?Allergies  ?Allergen Reactions  ? Adhesive [Tape] Other (See Comments)  ?  Tears skin  ? Celebrex [Celecoxib] Other (See Comments)  ?  Causes nose bleeds  ? Diphenhydramine Other (See Comments)  ? Nsaids Other (See Comments)  ?  Causes afib  ? ? ?Review of Systems: Negative except as noted in the HPI. ? ?Objective: No changes noted in today's physical examination. ?Constitutional [name] [name] is a pleasant 76 y.o. Caucasian female, in NAD. AAO x 3.   ?Vascular CFT immediate b/l LE. Palpable DP/PT pulses b/l LE. Digital hair present b/l. Skin temperature gradient WNL b/l. No pain with calf compression b/l. No edema noted b/l. No cyanosis or clubbing noted b/l LE.  ?Neurologic Normal speech. Oriented to person, place, and time. Protective sensation intact 5/5 intact bilaterally with 10g monofilament b/l.  ?Dermatologic Pedal integument with normal turgor, texture and tone BLE. Toenails 2-5 bilaterally and R hallux elongated, discolored, dystrophic, thickened, and crumbly with subungual debris and tenderness to dorsal palpation. Anonychia noted L hallux. Nailbed(s) epithelialized.  Hyperkeratotic lesion(s) bilateral great toes and submet head 5 b/l.  No erythema, no edema, no drainage, no fluctuance.  ?Orthopedic: Normal muscle strength 5/5 to all lower extremity  muscle groups bilaterally. No pain, crepitus or joint limitation noted with ROM b/l LE. No gross bony pedal deformities b/l. Patient ambulates independently without assistive aids.  ? ?Radiographs: None ?Assessment/Plan: ?1. Pain due to onychomycosis of toenails of both feet   ?2. Callus   ?3. Hypercoagulable state ([name] [name])   ?-Examined patient. ?-No new findings. No new orders. ?-Toenails 1-5 b/l were debrided in length and girth with sterile nail nippers and dremel without iatrogenic bleeding.  ?-Callus(es) bilateral great toes and submet head 5 b/l pared utilizing sterile scalpel blade without complication or incident. Total number debrided =4. ?-Patient/POA to call should there be question/concern in the interim.  ? ?Return in about 3 months (around 03/10/2022). ? ?[name] [name], DPM  ?

## 2021-12-30 DIAGNOSIS — I4821 Permanent atrial fibrillation: Secondary | ICD-10-CM | POA: Diagnosis not present

## 2021-12-30 DIAGNOSIS — Z7901 Long term (current) use of anticoagulants: Secondary | ICD-10-CM | POA: Diagnosis not present

## 2021-12-30 DIAGNOSIS — I129 Hypertensive chronic kidney disease with stage 1 through stage 4 chronic kidney disease, or unspecified chronic kidney disease: Secondary | ICD-10-CM | POA: Diagnosis not present

## 2022-02-01 DIAGNOSIS — Z7901 Long term (current) use of anticoagulants: Secondary | ICD-10-CM | POA: Diagnosis not present

## 2022-02-01 DIAGNOSIS — I4821 Permanent atrial fibrillation: Secondary | ICD-10-CM | POA: Diagnosis not present

## 2022-02-01 DIAGNOSIS — D6869 Other thrombophilia: Secondary | ICD-10-CM | POA: Diagnosis not present

## 2022-02-18 DIAGNOSIS — B369 Superficial mycosis, unspecified: Secondary | ICD-10-CM | POA: Diagnosis not present

## 2022-02-18 DIAGNOSIS — R350 Frequency of micturition: Secondary | ICD-10-CM | POA: Diagnosis not present

## 2022-03-16 DIAGNOSIS — D6869 Other thrombophilia: Secondary | ICD-10-CM | POA: Diagnosis not present

## 2022-03-16 DIAGNOSIS — I4821 Permanent atrial fibrillation: Secondary | ICD-10-CM | POA: Diagnosis not present

## 2022-03-16 DIAGNOSIS — Z7901 Long term (current) use of anticoagulants: Secondary | ICD-10-CM | POA: Diagnosis not present

## 2022-03-18 ENCOUNTER — Ambulatory Visit: Payer: Medicare HMO | Admitting: Podiatry

## 2022-03-28 DIAGNOSIS — Z1231 Encounter for screening mammogram for malignant neoplasm of breast: Secondary | ICD-10-CM | POA: Diagnosis not present

## 2022-03-30 DIAGNOSIS — D6869 Other thrombophilia: Secondary | ICD-10-CM | POA: Diagnosis not present

## 2022-03-30 DIAGNOSIS — Z7901 Long term (current) use of anticoagulants: Secondary | ICD-10-CM | POA: Diagnosis not present

## 2022-03-30 DIAGNOSIS — I4821 Permanent atrial fibrillation: Secondary | ICD-10-CM | POA: Diagnosis not present

## 2022-04-11 ENCOUNTER — Ambulatory Visit: Payer: Medicare HMO | Admitting: Podiatry

## 2022-04-27 DIAGNOSIS — M109 Gout, unspecified: Secondary | ICD-10-CM | POA: Diagnosis not present

## 2022-04-27 DIAGNOSIS — R7989 Other specified abnormal findings of blood chemistry: Secondary | ICD-10-CM | POA: Diagnosis not present

## 2022-04-27 DIAGNOSIS — E7800 Pure hypercholesterolemia, unspecified: Secondary | ICD-10-CM | POA: Diagnosis not present

## 2022-04-27 DIAGNOSIS — E039 Hypothyroidism, unspecified: Secondary | ICD-10-CM | POA: Diagnosis not present

## 2022-05-02 DIAGNOSIS — Z0000 Encounter for general adult medical examination without abnormal findings: Secondary | ICD-10-CM | POA: Diagnosis not present

## 2022-05-03 ENCOUNTER — Ambulatory Visit: Payer: Medicare HMO | Admitting: Podiatry

## 2022-05-03 DIAGNOSIS — R32 Unspecified urinary incontinence: Secondary | ICD-10-CM | POA: Diagnosis not present

## 2022-05-03 DIAGNOSIS — I4821 Permanent atrial fibrillation: Secondary | ICD-10-CM | POA: Diagnosis not present

## 2022-05-03 DIAGNOSIS — Z7901 Long term (current) use of anticoagulants: Secondary | ICD-10-CM | POA: Diagnosis not present

## 2022-05-03 DIAGNOSIS — E039 Hypothyroidism, unspecified: Secondary | ICD-10-CM | POA: Diagnosis not present

## 2022-05-03 DIAGNOSIS — I129 Hypertensive chronic kidney disease with stage 1 through stage 4 chronic kidney disease, or unspecified chronic kidney disease: Secondary | ICD-10-CM | POA: Diagnosis not present

## 2022-05-03 DIAGNOSIS — Z0000 Encounter for general adult medical examination without abnormal findings: Secondary | ICD-10-CM | POA: Diagnosis not present

## 2022-05-03 DIAGNOSIS — N184 Chronic kidney disease, stage 4 (severe): Secondary | ICD-10-CM | POA: Diagnosis not present

## 2022-05-03 DIAGNOSIS — R82998 Other abnormal findings in urine: Secondary | ICD-10-CM | POA: Diagnosis not present

## 2022-05-03 DIAGNOSIS — Z1331 Encounter for screening for depression: Secondary | ICD-10-CM | POA: Diagnosis not present

## 2022-05-03 DIAGNOSIS — D6869 Other thrombophilia: Secondary | ICD-10-CM | POA: Diagnosis not present

## 2022-05-03 DIAGNOSIS — Z1389 Encounter for screening for other disorder: Secondary | ICD-10-CM | POA: Diagnosis not present

## 2022-05-03 DIAGNOSIS — E7800 Pure hypercholesterolemia, unspecified: Secondary | ICD-10-CM | POA: Diagnosis not present

## 2022-05-16 ENCOUNTER — Emergency Department (HOSPITAL_COMMUNITY)
Admission: EM | Admit: 2022-05-16 | Discharge: 2022-05-16 | Disposition: A | Discharge status: Home / Self Care | Payer: Medicare HMO | Attending: Emergency Medicine | Admitting: Emergency Medicine

## 2022-05-16 ENCOUNTER — Encounter (HOSPITAL_COMMUNITY): Payer: Self-pay

## 2022-05-16 ENCOUNTER — Other Ambulatory Visit: Payer: Self-pay

## 2022-05-16 DIAGNOSIS — Z79899 Other long term (current) drug therapy: Secondary | ICD-10-CM | POA: Insufficient documentation

## 2022-05-16 DIAGNOSIS — I4891 Unspecified atrial fibrillation: Secondary | ICD-10-CM | POA: Diagnosis not present

## 2022-05-16 DIAGNOSIS — R21 Rash and other nonspecific skin eruption: Secondary | ICD-10-CM | POA: Insufficient documentation

## 2022-05-16 DIAGNOSIS — I10 Essential (primary) hypertension: Secondary | ICD-10-CM | POA: Insufficient documentation

## 2022-05-16 DIAGNOSIS — E039 Hypothyroidism, unspecified: Secondary | ICD-10-CM | POA: Insufficient documentation

## 2022-05-16 MED ORDER — PREDNISONE 20 MG PO TABS: 20.0000 mg | ORAL_TABLET | Freq: Every day | ORAL | 0 refills | Status: AC

## 2022-05-16 NOTE — Discharge Instructions (Signed)
Please STOP taking your Bactrim. You may use topical cream for itching and I am prescribing a low dose steroid course for the next 4 days. Return with any new or worsening symptoms. Make your PCP aware of your symptoms and reaction to Bactrim so that they do not prescribe this medication.

## 2022-05-16 NOTE — ED Provider Notes (Signed)
Emergency Department Provider Note   I have reviewed the triage vital signs and the nursing notes.   HISTORY  Chief Complaint Rash and Pruritis   HPI [name] [name] is a 76 y.o. female with past history reviewed below including A-fib on Coumadin presents to the emergency department for evaluation of itchy rash over the past 2 days.  Patient has diffuse rash over the trunk mainly, worse on the back, but also including the arms and legs.  No oral lesions or sores.  Patient was prescribed Bactrim 5 days ago for an asymptomatic bacteriuria.  She states she went to her primary care physician for routine follow-up and was told she had a urinary infection.  She began Bactrim and rash began 2 days later.  No tongue or throat swelling.  No abdominal symptoms.   Past Medical History:  Diagnosis Date   AF (atrial fibrillation) (HCC)    Arthritis    OA   Cholelithiasis    Colon polyps    Diverticulosis    DJD (degenerative joint disease)    Dyslipidemia    Fatty liver disease, nonalcoholic    GERD (gastroesophageal reflux disease)    Gout    Hepatitis A AGE 71   History of hiatal hernia    PT THINKS   Hypertension    Hypothyroidism    Obesity     Review of Systems  Constitutional: No fever/chills Cardiovascular: Denies chest pain. Respiratory: Denies shortness of breath. Gastrointestinal: No abdominal pain.  Genitourinary: Negative for dysuria. Musculoskeletal: Negative for back pain. Skin: Positive rash Neurological: Negative for headaches.   ____________________________________________   PHYSICAL EXAM:  VITAL SIGNS: ED Triage Vitals  Enc Vitals Group     BP 05/16/22 0805 139/78     Pulse Rate 05/16/22 0805 (!) 105     Resp 05/16/22 0805 18     Temp 05/16/22 0805 99.8 F (37.7 C)     Temp Source 05/16/22 0805 Oral     SpO2 05/16/22 0805 98 %     Weight 05/16/22 0805 229 lb (103.9 kg)     Height 05/16/22 0805 '5\' 5"'$  (1.651 m)    Constitutional: Alert and  oriented. Well appearing and in no acute distress. Eyes: Conjunctivae are normal.  Head: Atraumatic. Nose: No congestion/rhinnorhea. Mouth/Throat: Mucous membranes are moist.   Neck: No stridor.  Cardiovascular: Good peripheral circulation. Respiratory: Normal respiratory effort.  Gastrointestinal:  No distention.  Musculoskeletal: No gross deformities of extremities. Neurologic:  Normal speech and language.  Skin:  Skin is warm and dry.  Coalescing erythematous rash mainly over the back and abdomen but more patchy areas in the extremities.  No oral mucosal involvement.  ____________________________________________   PROCEDURES  Procedure(s) performed:   Procedures  None  ____________________________________________   INITIAL IMPRESSION / ASSESSMENT AND PLAN / ED COURSE  Pertinent labs & imaging results that were available during my care of the patient were reviewed by me and considered in my medical decision making (see chart for details).   This patient is Presenting for Evaluation of rash, which does require a range of treatment options, and is a complaint that involves a high risk of morbidity and mortality.  The Differential Diagnoses include drug rash, contact dermatitis, cellulitis, DRESS, SJS/TEN, etc.   I did obtain Additional Historical Information from daughter.  Medical Decision Making: Summary:  Patient presents emergency department with rash that seems most consistent with acute drug eruption from Bactrim.  She was being treated for an asymptomatic  UTI.  I think it is reasonable for her to discontinue this antibiotic given her rash presentation and I do not plan to start additional antibiotic if she is not having any symptoms of UTI.  I have listed Bactrim as an allergy for her.  She will throw away the additional tablets of her prescription and make her PCP aware of the reaction.  I discussed symptomatic management with topical over-the-counter medications.  Also  offered a low-dose prednisone for 4 days to help with symptoms.  Patient notes that she has had steroid in the past and tolerated this well without complication.    Disposition: discharge  ____________________________________________  FINAL CLINICAL IMPRESSION(S) / ED DIAGNOSES  Final diagnoses:  Rash     NEW OUTPATIENT MEDICATIONS STARTED DURING THIS VISIT:  New Prescriptions   PREDNISONE (DELTASONE) 20 MG TABLET    Take 1 tablet (20 mg total) by mouth daily for 4 days.    Note:  This document was prepared using Dragon voice recognition software and may include unintentional dictation errors.  Nanda Quinton, MD, [facility]    Rondalyn Belford, [facility], MD 05/16/22 [phone]

## 2022-05-16 NOTE — ED Triage Notes (Signed)
Patient reports that she has had itching and a rash since yesterday. Rash is on the entire body especially chest and back.

## 2022-05-20 DIAGNOSIS — L270 Generalized skin eruption due to drugs and medicaments taken internally: Secondary | ICD-10-CM | POA: Diagnosis not present

## 2022-05-20 DIAGNOSIS — L509 Urticaria, unspecified: Secondary | ICD-10-CM | POA: Diagnosis not present

## 2022-05-20 DIAGNOSIS — T3695XA Adverse effect of unspecified systemic antibiotic, initial encounter: Secondary | ICD-10-CM | POA: Diagnosis not present

## 2022-05-27 DIAGNOSIS — N393 Stress incontinence (female) (male): Secondary | ICD-10-CM | POA: Diagnosis not present

## 2022-05-27 DIAGNOSIS — N3941 Urge incontinence: Secondary | ICD-10-CM | POA: Diagnosis not present

## 2022-06-07 DIAGNOSIS — I4821 Permanent atrial fibrillation: Secondary | ICD-10-CM | POA: Diagnosis not present

## 2022-06-07 DIAGNOSIS — D6869 Other thrombophilia: Secondary | ICD-10-CM | POA: Diagnosis not present

## 2022-06-07 DIAGNOSIS — Z7901 Long term (current) use of anticoagulants: Secondary | ICD-10-CM | POA: Diagnosis not present

## 2022-06-07 DIAGNOSIS — I129 Hypertensive chronic kidney disease with stage 1 through stage 4 chronic kidney disease, or unspecified chronic kidney disease: Secondary | ICD-10-CM | POA: Diagnosis not present

## 2022-06-08 ENCOUNTER — Ambulatory Visit: Payer: Medicare HMO | Admitting: Podiatry

## 2022-06-08 ENCOUNTER — Encounter: Payer: Self-pay | Admitting: Podiatry

## 2022-06-08 DIAGNOSIS — D6859 Other primary thrombophilia: Secondary | ICD-10-CM

## 2022-06-08 DIAGNOSIS — M79675 Pain in left toe(s): Secondary | ICD-10-CM

## 2022-06-08 DIAGNOSIS — B351 Tinea unguium: Secondary | ICD-10-CM

## 2022-06-08 DIAGNOSIS — M79674 Pain in right toe(s): Secondary | ICD-10-CM | POA: Diagnosis not present

## 2022-06-11 NOTE — Progress Notes (Signed)
  Subjective:  Patient ID: [name] [name], female    DOB: 1946/03/09,  MRN: [id number]  [name] [name] presents to clinic today for:  Chief Complaint  Patient presents with   Nail Problem    Routine foot care PCP-Tisovec PCP VST-04/2022   New problem(s): None.   PCP is Tisovec, Fransico Him, MD , and last visit was  March 16, 2022.  Allergies  Allergen Reactions   Bactrim [Sulfamethoxazole-Trimethoprim] Rash    Drug eruption type rash    Adhesive [Tape] Other (See Comments)    Tears skin   Celebrex [Celecoxib] Other (See Comments)    Causes nose bleeds   Diphenhydramine Other (See Comments)   Nsaids Other (See Comments)    Causes afib    Review of Systems: Negative except as noted in the HPI.  Objective: No changes noted in today's physical examination.  [name] [name] is a pleasant 76 y.o. female in NAD. AAO x 3. Vascular CFT immediate b/l LE. Palpable DP/PT pulses b/l LE. Digital hair present b/l. Skin temperature gradient WNL b/l. No pain with calf compression b/l. No edema noted b/l. No cyanosis or clubbing noted b/l LE.  Neurologic Normal speech. Oriented to person, place, and time. Protective sensation intact 5/5 intact bilaterally with 10g monofilament b/l.  Dermatologic Pedal integument with normal turgor, texture and tone BLE. Toenails 2-5 bilaterally and R hallux elongated, discolored, dystrophic, thickened, and crumbly with subungual debris and tenderness to dorsal palpation. Anonychia noted L hallux. Nailbed(s) epithelialized.   Orthopedic: Normal muscle strength 5/5 to all lower extremity muscle groups bilaterally. No pain, crepitus or joint limitation noted with ROM b/l LE. No gross bony pedal deformities b/l. Patient ambulates independently without assistive aids.   Radiographs: None Assessment/Plan: 1. Pain due to onychomycosis of toenails of both feet   2. Hypercoagulable state ([place])     No orders of the defined types were placed in this encounter.   -Patient's  family member present. All questions/concerns addressed on today's visit. -Examined patient. -Toenails 1-5 b/l were debrided in length and girth with sterile nail nippers and dremel without iatrogenic bleeding.  -Patient/POA to call should there be question/concern in the interim.   No follow-ups on file.  Marzetta Board, DPM

## 2022-06-20 DIAGNOSIS — Z961 Presence of intraocular lens: Secondary | ICD-10-CM | POA: Diagnosis not present

## 2022-06-20 DIAGNOSIS — H524 Presbyopia: Secondary | ICD-10-CM | POA: Diagnosis not present

## 2022-06-22 DIAGNOSIS — I4821 Permanent atrial fibrillation: Secondary | ICD-10-CM | POA: Diagnosis not present

## 2022-06-22 DIAGNOSIS — Z7901 Long term (current) use of anticoagulants: Secondary | ICD-10-CM | POA: Diagnosis not present

## 2022-06-22 DIAGNOSIS — D6869 Other thrombophilia: Secondary | ICD-10-CM | POA: Diagnosis not present

## 2022-06-29 DIAGNOSIS — Z23 Encounter for immunization: Secondary | ICD-10-CM | POA: Diagnosis not present

## 2022-06-29 DIAGNOSIS — I4821 Permanent atrial fibrillation: Secondary | ICD-10-CM | POA: Diagnosis not present

## 2022-06-29 DIAGNOSIS — Z7901 Long term (current) use of anticoagulants: Secondary | ICD-10-CM | POA: Diagnosis not present

## 2022-06-29 DIAGNOSIS — D6869 Other thrombophilia: Secondary | ICD-10-CM | POA: Diagnosis not present

## 2022-06-30 DIAGNOSIS — L720 Epidermal cyst: Secondary | ICD-10-CM | POA: Diagnosis not present

## 2022-06-30 DIAGNOSIS — L538 Other specified erythematous conditions: Secondary | ICD-10-CM | POA: Diagnosis not present

## 2022-06-30 DIAGNOSIS — L820 Inflamed seborrheic keratosis: Secondary | ICD-10-CM | POA: Diagnosis not present

## 2022-06-30 DIAGNOSIS — D179 Benign lipomatous neoplasm, unspecified: Secondary | ICD-10-CM | POA: Diagnosis not present

## 2022-06-30 DIAGNOSIS — D171 Benign lipomatous neoplasm of skin and subcutaneous tissue of trunk: Secondary | ICD-10-CM | POA: Diagnosis not present

## 2022-06-30 DIAGNOSIS — D485 Neoplasm of uncertain behavior of skin: Secondary | ICD-10-CM | POA: Diagnosis not present

## 2022-06-30 DIAGNOSIS — L304 Erythema intertrigo: Secondary | ICD-10-CM | POA: Diagnosis not present

## 2022-06-30 DIAGNOSIS — L821 Other seborrheic keratosis: Secondary | ICD-10-CM | POA: Diagnosis not present

## 2022-07-08 DIAGNOSIS — N3941 Urge incontinence: Secondary | ICD-10-CM | POA: Diagnosis not present

## 2022-07-08 DIAGNOSIS — N393 Stress incontinence (female) (male): Secondary | ICD-10-CM | POA: Diagnosis not present

## 2022-07-28 DIAGNOSIS — D6869 Other thrombophilia: Secondary | ICD-10-CM | POA: Diagnosis not present

## 2022-07-28 DIAGNOSIS — Z7901 Long term (current) use of anticoagulants: Secondary | ICD-10-CM | POA: Diagnosis not present

## 2022-07-28 DIAGNOSIS — I4821 Permanent atrial fibrillation: Secondary | ICD-10-CM | POA: Diagnosis not present

## 2022-08-31 DIAGNOSIS — Z7901 Long term (current) use of anticoagulants: Secondary | ICD-10-CM | POA: Diagnosis not present

## 2022-08-31 DIAGNOSIS — I4821 Permanent atrial fibrillation: Secondary | ICD-10-CM | POA: Diagnosis not present

## 2022-08-31 DIAGNOSIS — D6869 Other thrombophilia: Secondary | ICD-10-CM | POA: Diagnosis not present

## 2022-09-27 NOTE — Progress Notes (Unsigned)
[name] [name] Date of Birth: 06-23-1946   History of Present Illness: [name] [name] is seen for  followup of atrial fibrillation. She has a history of permanent atrial fibrillation treated with  anticoagulation with Coumadin. No need for rate control therapy since HR has been normal. Prior Holter monitor showed average HR 72. Range 38-139. Longest pause 2.5 sec.    In August 2017 she had ERCP for common duct stone. In November 2017 she had colonoscopy with findings of three polyps. In June 2019 she had left TKR then in June 2019 right TKR.  She was seen in April 2019 after 2 syncopal episodes. She was found to have orthostatic hypotension and lisinopril HCT was stopped. On follow up she has had no further syncope.  On follow up today she is doing OK. States that she is stressed to the max. Caring for sister with Alzhiemers and husband who has lung Ca. . She denies any dizziness, syncope, dyspnea, chest pain. No change in ankle swelling. Feels well. Notes she has gained 7-9 lbs.   Current Outpatient Medications on File Prior to Visit  Medication Sig Dispense Refill   acetaminophen (TYLENOL) 650 MG CR tablet Tylenol     allopurinol (ZYLOPRIM) 300 MG tablet Take 1 tablet by mouth daily.     allopurinol (ZYLOPRIM) 300 MG tablet Take 1 tablet by mouth daily.     COENZYME Q10 PO Take 1 capsule by mouth daily.      fexofenadine (ALLEGRA) 180 MG tablet Take 180 mg by mouth daily.      furosemide (LASIX) 20 MG tablet Take 40 mg by mouth daily.     levothyroxine (SYNTHROID) 100 MCG tablet Take 1 tablet by mouth daily.     levothyroxine (SYNTHROID, LEVOTHROID) 100 MCG tablet Take 100 mcg by mouth daily before breakfast.     Liniments (SALONPAS PAIN RELIEF PATCH EX) Apply 1 patch topically daily as needed (pain).     LINZESS 72 MCG capsule Take 1 capsule by mouth as needed.      methocarbamol (ROBAXIN) 500 MG tablet Take 1 tablet by mouth 2 (two) times daily.     olmesartan (BENICAR) 20 MG tablet Take  20 mg by mouth daily.     omeprazole (PRILOSEC) 20 MG capsule Take 1 capsule by mouth daily.     pravastatin (PRAVACHOL) 40 MG tablet Take 1 tablet by mouth daily.     traMADol (ULTRAM) 50 MG tablet Take 50 mg by mouth as needed.      Vibegron (GEMTESA) 75 MG TABS 1 tablet Orally Once a day     warfarin (COUMADIN) 5 MG tablet Take 2.5-5 mg by mouth See admin instructions. Take 5 mg by mouth daily at night on Monday and Friday. Take 2.5 mg by mouth daily at night on all other days     No current facility-administered medications on file prior to visit.    Allergies  Allergen Reactions   Bactrim [Sulfamethoxazole-Trimethoprim] Rash    Drug eruption type rash    Adhesive [Tape] Other (See Comments)    Tears skin   Celebrex [Celecoxib] Other (See Comments)    Causes nose bleeds   Diphenhydramine Other (See Comments)   Nsaids Other (See Comments)    Causes afib    Past Medical History:  Diagnosis Date   AF (atrial fibrillation) (HCC)    Arthritis    OA   Cholelithiasis    Colon polyps    Diverticulosis    DJD (degenerative joint  disease)    Dyslipidemia    Fatty liver disease, nonalcoholic    GERD (gastroesophageal reflux disease)    Gout    Hepatitis A AGE 77   History of hiatal hernia    PT THINKS   Hypertension    Hypothyroidism    Obesity     Past Surgical History:  Procedure Laterality Date   BREAST BIOPSY Bilateral    1 right, 2 left   CHOLECYSTECTOMY  22 YRS AGO   CYST ON CERVIX REMOVED  1965   ERCP  20 YRS AGO   ERCP N/A 04/12/2016   Procedure: ENDOSCOPIC RETROGRADE CHOLANGIOPANCREATOGRAPHY (ERCP);  Surgeon: Irene Shipper, MD;  Location: [facility];  Service: Endoscopy;  Laterality: N/A;   EYE SURGERY Bilateral 2015   LASER SURGERY DONE ALSO BOTH EYES FOR FILM OVER EYES   KNEE ARTHROSCOPY 20 YRS AGO Left    PLANTAR FASCIA SURGERY Left    TOENAIL EXCISION Bilateral    TONSILLECTOMY     TOTAL ABDOMINAL HYSTERECTOMY     with appendectomy, COMPLETE   TOTAL  KNEE ARTHROPLASTY Left 09/25/2017   Procedure: LEFT TOTAL KNEE ARTHROPLASTY;  Surgeon: Paralee Cancel, MD;  Location: [facility];  Service: Orthopedics;  Laterality: Left;  90 mins   TOTAL KNEE ARTHROPLASTY Right 02/27/2018   Procedure: RIGHT TOTAL KNEE ARTHROPLASTY;  Surgeon: Paralee Cancel, MD;  Location: [facility];  Service: Orthopedics;  Laterality: Right;  Adductor Block    Social History   Tobacco Use  Smoking Status Former   Packs/day: 1.00   Years: 25.00   Total pack years: 25.00   Types: Cigarettes   Quit date: 09/12/1998   Years since quitting: 24.0  Smokeless Tobacco Never    Social History   Substance and Sexual Activity  Alcohol Use No   Alcohol/week: 0.0 standard drinks of alcohol    Family History  Problem Relation Age of Onset   Diabetes Mother    Stroke Mother    Heart disease Father    Diabetes Father    Arrhythmia Sister    Heart disease Sister        x 2   Breast cancer Sister        x 2   Diabetes Sister        x 2    Review of Systems: As noted in history of present illness. All other systems were reviewed and are negative.  Physical Exam: There were no vitals taken for this visit. GENERAL:  Well appearing WF in NAD HEENT:  PERRL, EOMI, sclera are clear. Oropharynx is clear. NECK:  No jugular venous distention, carotid upstroke brisk and symmetric, no bruits, no thyromegaly or adenopathy LUNGS:  Clear to auscultation bilaterally CHEST:  Unremarkable HEART:  IRRR,  PMI not displaced or sustained,S1 and S2 within normal limits, no S3, no S4: no clicks, no rubs, no murmurs ABD:  Soft, nontender. BS +, no masses or bruits. No hepatomegaly, no splenomegaly EXT:  2 + pulses throughout, tr edema, no cyanosis no clubbing SKIN:  Warm and dry.  No rashes NEURO:  Alert and oriented x 3. Cranial nerves II through XII intact. PSYCH:  Cognitively intact    LABORATORY DATA:  Lab Results  Component Value Date   WBC 15.2 (H) 02/28/2018   HGB 9.5 (L) 02/28/2018    HCT 28.5 (L) 02/28/2018   PLT 163 02/28/2018   GLUCOSE 146 (H) 02/28/2018   ALT 30 02/22/2018   AST 31 02/22/2018   NA 141  02/28/2018   K 4.7 02/28/2018   CL 109 02/28/2018   CREATININE 1.98 (H) 02/28/2018   BUN 72 (H) 02/28/2018   CO2 23 02/28/2018   INR 1.7 (A) 12/25/2018   Labs dated 03/03/16: cholesterol 154, triglycerides 72, LDL 86, HDL 54.  Dated 09/23/16: BUN 59, creatinine 1.5. CMET, TSH, CBC normal.  Dated 03/13/18: cholesterol 141, triglycerides 59, HDL 49, LDL 80. Hgb 10, creatinine 1.6. ALT and TSH normal. Dated 03/26/19: cholesterol 145, triglycerides 66, HDL 56, LDL 76. Creatinine 1.3. other chemistries and TSH normal.  Dated 10/04/19: creatinine 1.3, otherwise CMET, CBC, TFTs normal Dated 04/03/20: cholesterol 144, triglycerides 63, HDL 59, LDL 72. Creatinine 1.4. otherwise CMET and CBC normal Dated 10/20/20: CMET and TFTs normal. Dated 04/16/21: BUn 35, creatinine 1.3. otherwise CMET and CBC normal. Cholesterol 151, triglycerides 79, HDL 56, LDL 79. TSH 0.3. free T4 1.0.  Dated 04/28/22: cholesterol 135, triglycerides 83, HDL 50, LDL 68. LFTs and potassium normal. TFTs normal.   Ecg today shows Afib with rate 60. Low voltage. I have personally reviewed and interpreted this study.   Assessment / Plan: 1. Atrial fibrillation. Rate is well controlled on no medication. She is on anticoagulation with Coumadin.  Mali vasc score of 3. She is asymptomatic.  2. Morbid obesity. Encouraged  weight loss.   3. Hypercholesterolemia-on pravastatin. Controlled.   4. CKD stable.   Follow up in one year

## 2022-09-28 ENCOUNTER — Ambulatory Visit: Payer: Medicare HMO | Admitting: Podiatry

## 2022-10-03 ENCOUNTER — Encounter: Payer: Self-pay | Admitting: Podiatry

## 2022-10-03 ENCOUNTER — Ambulatory Visit: Payer: Medicare HMO | Admitting: Podiatry

## 2022-10-03 VITALS — BP 126/47

## 2022-10-03 DIAGNOSIS — D6859 Other primary thrombophilia: Secondary | ICD-10-CM

## 2022-10-03 DIAGNOSIS — L84 Corns and callosities: Secondary | ICD-10-CM

## 2022-10-03 DIAGNOSIS — M79675 Pain in left toe(s): Secondary | ICD-10-CM | POA: Diagnosis not present

## 2022-10-03 DIAGNOSIS — B351 Tinea unguium: Secondary | ICD-10-CM | POA: Diagnosis not present

## 2022-10-03 DIAGNOSIS — M79674 Pain in right toe(s): Secondary | ICD-10-CM | POA: Diagnosis not present

## 2022-10-03 NOTE — Progress Notes (Signed)
  Subjective:  Patient ID: [name] [name], female    DOB: [birth year],  MRN: [id number]  [name] [name] presents to clinic today for at risk foot care with h/o clotting disorder and painful thick toenails that are difficult to trim. Pain interferes with ambulation. Aggravating factors include wearing enclosed shoe gear. Pain is relieved with periodic professional debridement.  Chief Complaint  Patient presents with   Nail Problem    [facility] PCP-Tisovec PCP VST-6 months ago   New problem(s): None.   PCP is Tisovec, Fransico Him, MD.  Allergies  Allergen Reactions   Bactrim [Sulfamethoxazole-Trimethoprim] Rash    Drug eruption type rash    Adhesive [Tape] Other (See Comments)    Tears skin   Celebrex [Celecoxib] Other (See Comments)    Causes nose bleeds   Diphenhydramine Other (See Comments)   Nsaids Other (See Comments)    Causes afib    Review of Systems: Negative except as noted in the HPI.  Objective: No changes noted in today's physical examination. Vitals:   10/03/22 1056  BP: (!) 126/47    [name] [name] is a pleasant 77 y.o. female in NAD. AAO x 3.  Vascular CFT immediate b/l LE. Palpable DP/PT pulses b/l LE. Digital hair present b/l. Skin temperature gradient WNL b/l. No pain with calf compression b/l. No edema noted b/l. No cyanosis or clubbing noted b/l LE.  Neurologic Normal speech. Oriented to person, place, and time. Protective sensation intact 5/5 intact bilaterally with 10g monofilament b/l.  Dermatologic Pedal integument with normal turgor, texture and tone BLE. Toenails 2-5 bilaterally and R hallux elongated, discolored, dystrophic, thickened, and crumbly with subungual debris and tenderness to dorsal palpation. Anonychia noted L hallux. Nailbed(s) epithelialized.   Orthopedic: Normal muscle strength 5/5 to all lower extremity muscle groups bilaterally. No pain, crepitus or joint limitation noted with ROM b/l LE. No gross bony pedal deformities b/l. Patient ambulates  independently without assistive aids.   Radiographs: None Assessment/Plan: 1. Pain due to onychomycosis of toenails of both feet   2. Hypercoagulable state ([place])      -Patient was evaluated and treated. All patient's and/or POA's questions/concerns answered on today's visit. -Patient to continue soft, supportive shoe gear daily. -Mycotic toenails 1-5 bilaterally were debrided in length and girth with sterile nail nippers and dremel without incident. -Patient/POA to call should there be question/concern in the interim.   Return in about 3 months (around 01/02/2023).  Marzetta Board, DPM

## 2022-10-04 DIAGNOSIS — D6869 Other thrombophilia: Secondary | ICD-10-CM | POA: Diagnosis not present

## 2022-10-04 DIAGNOSIS — I4821 Permanent atrial fibrillation: Secondary | ICD-10-CM | POA: Diagnosis not present

## 2022-10-04 DIAGNOSIS — Z7901 Long term (current) use of anticoagulants: Secondary | ICD-10-CM | POA: Diagnosis not present

## 2022-10-05 ENCOUNTER — Encounter: Payer: Self-pay | Admitting: Cardiology

## 2022-10-05 ENCOUNTER — Ambulatory Visit: Payer: Medicare HMO | Attending: Cardiology | Admitting: Cardiology

## 2022-10-05 VITALS — BP 130/74 | HR 73 | Ht 65.0 in | Wt 231.6 lb

## 2022-10-05 DIAGNOSIS — E7800 Pure hypercholesterolemia, unspecified: Secondary | ICD-10-CM | POA: Diagnosis not present

## 2022-10-05 DIAGNOSIS — I4891 Unspecified atrial fibrillation: Secondary | ICD-10-CM | POA: Diagnosis not present

## 2022-10-05 DIAGNOSIS — Z7901 Long term (current) use of anticoagulants: Secondary | ICD-10-CM

## 2022-10-05 DIAGNOSIS — I10 Essential (primary) hypertension: Secondary | ICD-10-CM | POA: Diagnosis not present

## 2022-10-05 DIAGNOSIS — I482 Chronic atrial fibrillation, unspecified: Secondary | ICD-10-CM

## 2022-10-05 NOTE — Patient Instructions (Signed)
Medication Instructions:  No Changes In Medications at this time.  *If you need a refill on your cardiac medications before your next appointment, please call your pharmacy*  Lab Work: None Ordered At This Time.  If you have labs (blood work) drawn today and your tests are completely normal, you will receive your results only by: [place] (if you have MyChart) OR A paper copy in the mail If you have any lab test that is abnormal or we need to change your treatment, we will call you to review the results.  Testing/Procedures: None Ordered At This Time.   Follow-Up: At [facility], you and your health needs are our priority.  As part of our continuing mission to provide you with exceptional heart care, we have created designated Provider Care Teams.  These Care Teams include your primary Cardiologist (physician) and Advanced Practice Providers (APPs -  Physician Assistants and Nurse Practitioners) who all work together to provide you with the care you need, when you need it.  Your next appointment:   1 year(s)  Provider:   Peter Martinique, MD

## 2022-10-25 DIAGNOSIS — L900 Lichen sclerosus et atrophicus: Secondary | ICD-10-CM | POA: Diagnosis not present

## 2022-10-25 DIAGNOSIS — L814 Other melanin hyperpigmentation: Secondary | ICD-10-CM | POA: Diagnosis not present

## 2022-10-25 DIAGNOSIS — L821 Other seborrheic keratosis: Secondary | ICD-10-CM | POA: Diagnosis not present

## 2022-10-25 DIAGNOSIS — D225 Melanocytic nevi of trunk: Secondary | ICD-10-CM | POA: Diagnosis not present

## 2022-10-25 DIAGNOSIS — L304 Erythema intertrigo: Secondary | ICD-10-CM | POA: Diagnosis not present

## 2022-11-09 DIAGNOSIS — M109 Gout, unspecified: Secondary | ICD-10-CM | POA: Diagnosis not present

## 2022-11-09 DIAGNOSIS — N184 Chronic kidney disease, stage 4 (severe): Secondary | ICD-10-CM | POA: Diagnosis not present

## 2022-11-09 DIAGNOSIS — D6869 Other thrombophilia: Secondary | ICD-10-CM | POA: Diagnosis not present

## 2022-11-09 DIAGNOSIS — I129 Hypertensive chronic kidney disease with stage 1 through stage 4 chronic kidney disease, or unspecified chronic kidney disease: Secondary | ICD-10-CM | POA: Diagnosis not present

## 2022-11-09 DIAGNOSIS — I77810 Thoracic aortic ectasia: Secondary | ICD-10-CM | POA: Diagnosis not present

## 2022-11-09 DIAGNOSIS — E7800 Pure hypercholesterolemia, unspecified: Secondary | ICD-10-CM | POA: Diagnosis not present

## 2022-11-09 DIAGNOSIS — D692 Other nonthrombocytopenic purpura: Secondary | ICD-10-CM | POA: Diagnosis not present

## 2022-11-09 DIAGNOSIS — E039 Hypothyroidism, unspecified: Secondary | ICD-10-CM | POA: Diagnosis not present

## 2022-11-09 DIAGNOSIS — I4821 Permanent atrial fibrillation: Secondary | ICD-10-CM | POA: Diagnosis not present

## 2022-11-11 DIAGNOSIS — N393 Stress incontinence (female) (male): Secondary | ICD-10-CM | POA: Diagnosis not present

## 2022-11-11 DIAGNOSIS — N3941 Urge incontinence: Secondary | ICD-10-CM | POA: Diagnosis not present

## 2022-12-07 DIAGNOSIS — D6869 Other thrombophilia: Secondary | ICD-10-CM | POA: Diagnosis not present

## 2022-12-07 DIAGNOSIS — I4821 Permanent atrial fibrillation: Secondary | ICD-10-CM | POA: Diagnosis not present

## 2022-12-07 DIAGNOSIS — Z7901 Long term (current) use of anticoagulants: Secondary | ICD-10-CM | POA: Diagnosis not present

## 2023-01-04 DIAGNOSIS — D6869 Other thrombophilia: Secondary | ICD-10-CM | POA: Diagnosis not present

## 2023-01-04 DIAGNOSIS — I4821 Permanent atrial fibrillation: Secondary | ICD-10-CM | POA: Diagnosis not present

## 2023-01-04 DIAGNOSIS — Z7901 Long term (current) use of anticoagulants: Secondary | ICD-10-CM | POA: Diagnosis not present

## 2023-01-16 DIAGNOSIS — R5383 Other fatigue: Secondary | ICD-10-CM | POA: Diagnosis not present

## 2023-01-16 DIAGNOSIS — Z7901 Long term (current) use of anticoagulants: Secondary | ICD-10-CM | POA: Diagnosis not present

## 2023-01-16 DIAGNOSIS — R058 Other specified cough: Secondary | ICD-10-CM | POA: Diagnosis not present

## 2023-01-16 DIAGNOSIS — J209 Acute bronchitis, unspecified: Secondary | ICD-10-CM | POA: Diagnosis not present

## 2023-01-16 DIAGNOSIS — I4821 Permanent atrial fibrillation: Secondary | ICD-10-CM | POA: Diagnosis not present

## 2023-01-16 DIAGNOSIS — Z1152 Encounter for screening for COVID-19: Secondary | ICD-10-CM | POA: Diagnosis not present

## 2023-01-16 DIAGNOSIS — R0981 Nasal congestion: Secondary | ICD-10-CM | POA: Diagnosis not present

## 2023-01-16 DIAGNOSIS — G4730 Sleep apnea, unspecified: Secondary | ICD-10-CM | POA: Diagnosis not present

## 2023-01-17 ENCOUNTER — Ambulatory Visit: Payer: Medicare HMO | Admitting: Podiatry

## 2023-01-17 DIAGNOSIS — M79674 Pain in right toe(s): Secondary | ICD-10-CM | POA: Diagnosis not present

## 2023-01-17 DIAGNOSIS — B351 Tinea unguium: Secondary | ICD-10-CM | POA: Diagnosis not present

## 2023-01-17 DIAGNOSIS — M79675 Pain in left toe(s): Secondary | ICD-10-CM | POA: Diagnosis not present

## 2023-01-17 DIAGNOSIS — D6859 Other primary thrombophilia: Secondary | ICD-10-CM

## 2023-01-18 ENCOUNTER — Encounter: Payer: Self-pay | Admitting: Podiatry

## 2023-01-18 DIAGNOSIS — Z7901 Long term (current) use of anticoagulants: Secondary | ICD-10-CM | POA: Diagnosis not present

## 2023-01-18 DIAGNOSIS — I4821 Permanent atrial fibrillation: Secondary | ICD-10-CM | POA: Diagnosis not present

## 2023-01-18 NOTE — Progress Notes (Signed)
  Subjective:  Patient ID: [name] [name], female    DOB: 04-16-1946,  MRN: [id number]  [name] [name] presents to clinic today for painful elongated mycotic toenails 1-5 bilaterally which are tender when wearing enclosed shoe gear. Pain is relieved with periodic professional debridement.  Chief Complaint  Patient presents with   Nail Problem    RFC PCP-Tisovec PCP VST-10/2022   New problem(s): None.   PCP is Tisovec, Adelfa Koh, MD.  Allergies  Allergen Reactions   Bactrim [Sulfamethoxazole-Trimethoprim] Rash    Drug eruption type rash    Adhesive [Tape] Other (See Comments)    Tears skin   Celebrex [Celecoxib] Other (See Comments)    Causes nose bleeds   Diphenhydramine Other (See Comments)   Nsaids Other (See Comments)    Causes afib    Review of Systems: Negative except as noted in the HPI.  Objective: No changes noted in today's physical examination. There were no vitals filed for this visit. [name] [name] is a pleasant 77 y.o. female in NAD. AAO x 3.  Vascular Examination: Capillary refill time immediate b/l. Vascular status intact b/l with palpable pedal pulses. Pedal hair present b/l. No edema. No pain with calf compression b/l. Skin temperature gradient WNL b/l. No cyanosis or clubbing noted b/l LE.  Neurological Examination: Sensation grossly intact b/l with 10 gram monofilament. Vibratory sensation intact b/l.   Dermatological Examination: Pedal skin with normal turgor, texture and tone b/l.  No open wounds. No interdigital macerations.   Toenails right great toe and 2-5 b/l thick, discolored, elongated with subungual debris and pain on dorsal palpation.   Anonychia noted left great toe. Nailbed(s) epithelialized.  No hyperkeratotic nor porokeratotic lesions present on today's visit.  Musculoskeletal Examination: Muscle strength 5/5 to all lower extremity muscle groups bilaterally. No pain, crepitus or joint limitation noted with ROM bilateral LE. No gross bony  deformities bilaterally.  Radiographs: None  Assessment/Plan: 1. Pain due to onychomycosis of toenails of both feet   2. Hypercoagulable state (HCC)     -Consent given for treatment as described below: -Examined patient. -Patient to continue soft, supportive shoe gear daily. -Toenails 1-5 b/l were debrided in length and girth with sterile nail nippers and dremel without iatrogenic bleeding.  -Patient/POA to call should there be question/concern in the interim.   Return in about 3 months (around 04/19/2023).  Freddie Breech, DPM

## 2023-02-01 DIAGNOSIS — I4821 Permanent atrial fibrillation: Secondary | ICD-10-CM | POA: Diagnosis not present

## 2023-02-01 DIAGNOSIS — Z7901 Long term (current) use of anticoagulants: Secondary | ICD-10-CM | POA: Diagnosis not present

## 2023-02-01 DIAGNOSIS — D6869 Other thrombophilia: Secondary | ICD-10-CM | POA: Diagnosis not present

## 2023-03-02 DIAGNOSIS — D6869 Other thrombophilia: Secondary | ICD-10-CM | POA: Diagnosis not present

## 2023-03-02 DIAGNOSIS — I4821 Permanent atrial fibrillation: Secondary | ICD-10-CM | POA: Diagnosis not present

## 2023-03-02 DIAGNOSIS — Z7901 Long term (current) use of anticoagulants: Secondary | ICD-10-CM | POA: Diagnosis not present

## 2023-03-30 DIAGNOSIS — I4821 Permanent atrial fibrillation: Secondary | ICD-10-CM | POA: Diagnosis not present

## 2023-03-30 DIAGNOSIS — D6869 Other thrombophilia: Secondary | ICD-10-CM | POA: Diagnosis not present

## 2023-03-30 DIAGNOSIS — Z7901 Long term (current) use of anticoagulants: Secondary | ICD-10-CM | POA: Diagnosis not present

## 2023-04-03 DIAGNOSIS — Z1231 Encounter for screening mammogram for malignant neoplasm of breast: Secondary | ICD-10-CM | POA: Diagnosis not present

## 2023-04-24 ENCOUNTER — Ambulatory Visit: Payer: Medicare HMO | Admitting: Podiatry

## 2023-04-24 DIAGNOSIS — M79674 Pain in right toe(s): Secondary | ICD-10-CM

## 2023-04-24 DIAGNOSIS — B351 Tinea unguium: Secondary | ICD-10-CM

## 2023-04-24 DIAGNOSIS — M79675 Pain in left toe(s): Secondary | ICD-10-CM | POA: Diagnosis not present

## 2023-04-24 DIAGNOSIS — D6859 Other primary thrombophilia: Secondary | ICD-10-CM

## 2023-04-24 NOTE — Progress Notes (Signed)
  Subjective:  Patient ID: [name] [name], female    DOB: [birth year],   MRN: [id number]  Chief Complaint  Patient presents with   RFC    RFC    77 y.o. female presents for concern of thickened elongated and painful nails that are difficult to trim. Requesting to have them trimmed today . Patient with history of coagulation defect.   PCP:  Gaspar Garbe, MD    . Denies any other pedal complaints. Denies n/v/f/c.   Past Medical History:  Diagnosis Date   AF (atrial fibrillation) (HCC)    Arthritis    OA   Cholelithiasis    Colon polyps    Diverticulosis    DJD (degenerative joint disease)    Dyslipidemia    Fatty liver disease, nonalcoholic    GERD (gastroesophageal reflux disease)    Gout    Hepatitis A AGE 17   History of hiatal hernia    PT THINKS   Hypertension    Hypothyroidism    Obesity     Objective:  Physical Exam: Vascular: DP/PT pulses 2/4 bilateral. CFT <3 seconds. Absent hair growth on digits. Edema noted to bilateral lower extremities. Xerosis noted bilaterally.  Skin. No lacerations or abrasions bilateral feet. Nails 1-5 bilateral  are thickened discolored and elongated with subungual debris.  Musculoskeletal: MMT 5/5 bilateral lower extremities in DF, PF, Inversion and Eversion. Deceased ROM in DF of ankle joint.  Neurological: Sensation intact to light touch. Protective sensation intact bilateral.     Assessment:   1. Pain due to onychomycosis of toenails of both feet   2. Hypercoagulable state (HCC)      Plan:  Patient was evaluated and treated and all questions answered. -Mechanically debrided all nails 1-5 bilateral using sterile nail nipper and filed with dremel without incident  -Answered all patient questions -Patient to return  in 3 months for at risk foot care -Patient advised to call the office if any problems or questions arise in the meantime.   Louann Sjogren, DPM

## 2023-05-03 DIAGNOSIS — M109 Gout, unspecified: Secondary | ICD-10-CM | POA: Diagnosis not present

## 2023-05-03 DIAGNOSIS — E039 Hypothyroidism, unspecified: Secondary | ICD-10-CM | POA: Diagnosis not present

## 2023-05-03 DIAGNOSIS — Z1212 Encounter for screening for malignant neoplasm of rectum: Secondary | ICD-10-CM | POA: Diagnosis not present

## 2023-05-03 DIAGNOSIS — N184 Chronic kidney disease, stage 4 (severe): Secondary | ICD-10-CM | POA: Diagnosis not present

## 2023-05-03 DIAGNOSIS — E781 Pure hyperglyceridemia: Secondary | ICD-10-CM | POA: Diagnosis not present

## 2023-05-03 DIAGNOSIS — R7989 Other specified abnormal findings of blood chemistry: Secondary | ICD-10-CM | POA: Diagnosis not present

## 2023-05-03 DIAGNOSIS — I129 Hypertensive chronic kidney disease with stage 1 through stage 4 chronic kidney disease, or unspecified chronic kidney disease: Secondary | ICD-10-CM | POA: Diagnosis not present

## 2023-05-03 DIAGNOSIS — E7800 Pure hypercholesterolemia, unspecified: Secondary | ICD-10-CM | POA: Diagnosis not present

## 2023-05-10 DIAGNOSIS — Z0000 Encounter for general adult medical examination without abnormal findings: Secondary | ICD-10-CM | POA: Diagnosis not present

## 2023-05-10 DIAGNOSIS — R82998 Other abnormal findings in urine: Secondary | ICD-10-CM | POA: Diagnosis not present

## 2023-05-10 DIAGNOSIS — Z7901 Long term (current) use of anticoagulants: Secondary | ICD-10-CM | POA: Diagnosis not present

## 2023-05-10 DIAGNOSIS — E781 Pure hyperglyceridemia: Secondary | ICD-10-CM | POA: Diagnosis not present

## 2023-05-10 DIAGNOSIS — I4821 Permanent atrial fibrillation: Secondary | ICD-10-CM | POA: Diagnosis not present

## 2023-05-10 DIAGNOSIS — N184 Chronic kidney disease, stage 4 (severe): Secondary | ICD-10-CM | POA: Diagnosis not present

## 2023-05-10 DIAGNOSIS — I129 Hypertensive chronic kidney disease with stage 1 through stage 4 chronic kidney disease, or unspecified chronic kidney disease: Secondary | ICD-10-CM | POA: Diagnosis not present

## 2023-05-10 DIAGNOSIS — Z1212 Encounter for screening for malignant neoplasm of rectum: Secondary | ICD-10-CM | POA: Diagnosis not present

## 2023-05-10 DIAGNOSIS — E669 Obesity, unspecified: Secondary | ICD-10-CM | POA: Diagnosis not present

## 2023-05-10 DIAGNOSIS — Z6836 Body mass index (BMI) 36.0-36.9, adult: Secondary | ICD-10-CM | POA: Diagnosis not present

## 2023-05-10 DIAGNOSIS — D6869 Other thrombophilia: Secondary | ICD-10-CM | POA: Diagnosis not present

## 2023-05-19 DIAGNOSIS — N3946 Mixed incontinence: Secondary | ICD-10-CM | POA: Diagnosis not present

## 2023-05-19 DIAGNOSIS — R350 Frequency of micturition: Secondary | ICD-10-CM | POA: Diagnosis not present

## 2023-05-19 DIAGNOSIS — R351 Nocturia: Secondary | ICD-10-CM | POA: Diagnosis not present

## 2023-06-07 DIAGNOSIS — D6869 Other thrombophilia: Secondary | ICD-10-CM | POA: Diagnosis not present

## 2023-06-07 DIAGNOSIS — I4821 Permanent atrial fibrillation: Secondary | ICD-10-CM | POA: Diagnosis not present

## 2023-06-07 DIAGNOSIS — Z7901 Long term (current) use of anticoagulants: Secondary | ICD-10-CM | POA: Diagnosis not present

## 2023-06-07 DIAGNOSIS — Z23 Encounter for immunization: Secondary | ICD-10-CM | POA: Diagnosis not present

## 2023-06-15 DIAGNOSIS — R350 Frequency of micturition: Secondary | ICD-10-CM | POA: Diagnosis not present

## 2023-06-22 DIAGNOSIS — H524 Presbyopia: Secondary | ICD-10-CM | POA: Diagnosis not present

## 2023-06-22 DIAGNOSIS — Z961 Presence of intraocular lens: Secondary | ICD-10-CM | POA: Diagnosis not present

## 2023-06-23 DIAGNOSIS — H52209 Unspecified astigmatism, unspecified eye: Secondary | ICD-10-CM | POA: Diagnosis not present

## 2023-06-23 DIAGNOSIS — H524 Presbyopia: Secondary | ICD-10-CM | POA: Diagnosis not present

## 2023-06-23 DIAGNOSIS — H5203 Hypermetropia, bilateral: Secondary | ICD-10-CM | POA: Diagnosis not present

## 2023-06-30 DIAGNOSIS — N3946 Mixed incontinence: Secondary | ICD-10-CM | POA: Diagnosis not present

## 2023-06-30 DIAGNOSIS — R350 Frequency of micturition: Secondary | ICD-10-CM | POA: Diagnosis not present

## 2023-07-12 DIAGNOSIS — I4821 Permanent atrial fibrillation: Secondary | ICD-10-CM | POA: Diagnosis not present

## 2023-07-12 DIAGNOSIS — Z7901 Long term (current) use of anticoagulants: Secondary | ICD-10-CM | POA: Diagnosis not present

## 2023-07-12 DIAGNOSIS — D6869 Other thrombophilia: Secondary | ICD-10-CM | POA: Diagnosis not present

## 2023-07-19 DIAGNOSIS — I129 Hypertensive chronic kidney disease with stage 1 through stage 4 chronic kidney disease, or unspecified chronic kidney disease: Secondary | ICD-10-CM | POA: Diagnosis not present

## 2023-07-19 DIAGNOSIS — I4821 Permanent atrial fibrillation: Secondary | ICD-10-CM | POA: Diagnosis not present

## 2023-07-19 DIAGNOSIS — Z7901 Long term (current) use of anticoagulants: Secondary | ICD-10-CM | POA: Diagnosis not present

## 2023-07-19 DIAGNOSIS — R109 Unspecified abdominal pain: Secondary | ICD-10-CM | POA: Diagnosis not present

## 2023-07-26 ENCOUNTER — Encounter: Payer: Self-pay | Admitting: Podiatry

## 2023-07-26 ENCOUNTER — Ambulatory Visit: Payer: Medicare HMO | Admitting: Podiatry

## 2023-07-26 DIAGNOSIS — M79675 Pain in left toe(s): Secondary | ICD-10-CM | POA: Diagnosis not present

## 2023-07-26 DIAGNOSIS — M79674 Pain in right toe(s): Secondary | ICD-10-CM | POA: Diagnosis not present

## 2023-07-26 DIAGNOSIS — Z7901 Long term (current) use of anticoagulants: Secondary | ICD-10-CM | POA: Diagnosis not present

## 2023-07-26 DIAGNOSIS — D6869 Other thrombophilia: Secondary | ICD-10-CM | POA: Diagnosis not present

## 2023-07-26 DIAGNOSIS — B351 Tinea unguium: Secondary | ICD-10-CM | POA: Diagnosis not present

## 2023-07-26 DIAGNOSIS — I4821 Permanent atrial fibrillation: Secondary | ICD-10-CM | POA: Diagnosis not present

## 2023-07-30 ENCOUNTER — Encounter: Payer: Self-pay | Admitting: Podiatry

## 2023-07-30 NOTE — Progress Notes (Signed)
  Subjective:  Patient ID: [name] [name], female    DOB: [birth year],  MRN: [id number]  77 y.o. female presents painful thick toenails that are difficult to trim. Pain interferes with ambulation. Aggravating factors include wearing enclosed shoe gear. Pain is relieved with periodic professional debridement.  Chief Complaint  Patient presents with   RFC    RFC no concerns at this time   New problem(s): None   PCP is Tisovec, Adelfa Koh, MD , and last visit was March 30, 2023.  Allergies  Allergen Reactions   Bactrim [Sulfamethoxazole-Trimethoprim] Rash    Drug eruption type rash    Adhesive [Tape] Other (See Comments)    Tears skin   Celebrex [Celecoxib] Other (See Comments)    Causes nose bleeds   Diphenhydramine Other (See Comments)   Nsaids Other (See Comments)    Causes afib    Review of Systems: Negative except as noted in the HPI.   Objective:  [name] [name] is a pleasant 77 y.o. female in NAD. AAO x 3.  Vascular Examination: Vascular status intact b/l with palpable pedal pulses. CFT immediate b/l. Pedal hair present. No edema. No pain with calf compression b/l. Skin temperature gradient WNL b/l. No varicosities noted. No cyanosis or clubbing noted.  Neurological Examination: Sensation grossly intact b/l with 10 gram monofilament. Vibratory sensation intact b/l.  Dermatological Examination: Pedal skin with normal turgor, texture and tone b/l. No open wounds nor interdigital macerations noted. Toenails right hallux and 2-5 b/l thick, discolored, elongated with subungual debris and pain on dorsal palpation.   Anonychia noted L hallux. Nailbed(s) epithelialized.   No hyperkeratotic lesions noted b/l.   Musculoskeletal Examination: Muscle strength 5/5 to b/l LE.  No pain, crepitus noted b/l. No gross pedal deformities. Patient ambulates independently without assistive aids.   Radiographs: None  Assessment:   1. Pain due to onychomycosis of toenails of both feet     Plan:  -Consent given for treatment as described below: -Examined patient. -Continue supportive shoe gear daily. -Mycotic toenails 2-5 bilaterally and R hallux were debrided in length and girth with sterile nail nippers and dremel without iatrogenic bleeding. -Patient/POA to call should there be question/concern in the interim.  Return in about 3 months (around 10/26/2023).  Freddie Breech, DPM       LOCATION: [address] [place] [address]                   Office [phone]   [facility] LOCATION: [address] [place], [place] [address] Office [phone]

## 2023-08-08 DIAGNOSIS — I129 Hypertensive chronic kidney disease with stage 1 through stage 4 chronic kidney disease, or unspecified chronic kidney disease: Secondary | ICD-10-CM | POA: Diagnosis not present

## 2023-08-08 DIAGNOSIS — M7551 Bursitis of right shoulder: Secondary | ICD-10-CM | POA: Diagnosis not present

## 2023-08-31 DIAGNOSIS — Z7901 Long term (current) use of anticoagulants: Secondary | ICD-10-CM | POA: Diagnosis not present

## 2023-08-31 DIAGNOSIS — D6869 Other thrombophilia: Secondary | ICD-10-CM | POA: Diagnosis not present

## 2023-08-31 DIAGNOSIS — I4821 Permanent atrial fibrillation: Secondary | ICD-10-CM | POA: Diagnosis not present

## 2023-10-04 NOTE — Progress Notes (Unsigned)
[name] [name] Date of Birth: 1946/02/18   History of Present Illness: [name] [name] is seen for  followup of atrial fibrillation. She has a history of permanent atrial fibrillation treated with  anticoagulation with Coumadin. No need for rate control therapy since HR has been normal. Prior Holter monitor showed average HR 72. Range 38-139. Longest pause 2.5 sec.    In August 2017 she had ERCP for common duct stone. In November 2017 she had colonoscopy with findings of three polyps. In June 2019 she had left TKR then in June 2019 right TKR.  She was seen in April 2019 after 2 syncopal episodes. She was found to have orthostatic hypotension and lisinopril HCT was stopped. On follow up she has had no further syncope.  On follow up today she is doing well. She joined [employer] this summer and has lost 20 lbs.  She denies any dizziness, syncope, dyspnea, chest pain. No change in ankle swelling.  Current Outpatient Medications on File Prior to Visit  Medication Sig Dispense Refill   acetaminophen (TYLENOL) 650 MG CR tablet Tylenol     allopurinol (ZYLOPRIM) 300 MG tablet Take 1 tablet by mouth daily.     COENZYME Q10 PO Take 1 capsule by mouth daily.      fexofenadine (ALLEGRA) 180 MG tablet Take 180 mg by mouth daily.      furosemide (LASIX) 20 MG tablet Take 40 mg by mouth daily.     levothyroxine (SYNTHROID) 100 MCG tablet Take 1 tablet by mouth daily.     methocarbamol (ROBAXIN) 500 MG tablet Take 1 tablet by mouth 2 (two) times daily.     olmesartan (BENICAR) 20 MG tablet Take 20 mg by mouth daily.     omeprazole (PRILOSEC) 20 MG capsule Take 1 capsule by mouth daily.     pravastatin (PRAVACHOL) 40 MG tablet Take 1 tablet by mouth daily.     traMADol (ULTRAM) 50 MG tablet Take 50 mg by mouth as needed.      warfarin (COUMADIN) 5 MG tablet Take 2.5-5 mg by mouth See admin instructions. Take 5 mg by mouth daily at night on Monday and Friday. Take 2.5 mg by mouth daily at night on all  other days     No current facility-administered medications on file prior to visit.    Allergies  Allergen Reactions   Bactrim [Sulfamethoxazole-Trimethoprim] Rash    Drug eruption type rash    Adhesive [Tape] Other (See Comments)    Tears skin   Celebrex [Celecoxib] Other (See Comments)    Causes nose bleeds   Diphenhydramine Other (See Comments)   Nsaids Other (See Comments)    Causes afib    Past Medical History:  Diagnosis Date   AF (atrial fibrillation) (HCC)    Arthritis    OA   Cholelithiasis    Colon polyps    Diverticulosis    DJD (degenerative joint disease)    Dyslipidemia    Fatty liver disease, nonalcoholic    GERD (gastroesophageal reflux disease)    Gout    Hepatitis A AGE 51   History of hiatal hernia    PT THINKS   Hypertension    Hypothyroidism    Obesity     Past Surgical History:  Procedure Laterality Date   BREAST BIOPSY Bilateral    1 right, 2 left   CHOLECYSTECTOMY  22 YRS AGO   CYST ON CERVIX REMOVED  1965   ERCP  20 YRS AGO  ERCP N/A 04/12/2016   Procedure: ENDOSCOPIC RETROGRADE CHOLANGIOPANCREATOGRAPHY (ERCP);  Surgeon: Hilarie Fredrickson, MD;  Location: [facility];  Service: Endoscopy;  Laterality: N/A;   EYE SURGERY Bilateral 2015   LASER SURGERY DONE ALSO BOTH EYES FOR FILM OVER EYES   KNEE ARTHROSCOPY 20 YRS AGO Left    PLANTAR FASCIA SURGERY Left    TOENAIL EXCISION Bilateral    TONSILLECTOMY     TOTAL ABDOMINAL HYSTERECTOMY     with appendectomy, COMPLETE   TOTAL KNEE ARTHROPLASTY Left 09/25/2017   Procedure: LEFT TOTAL KNEE ARTHROPLASTY;  Surgeon: Durene Romans, MD;  Location: [facility];  Service: Orthopedics;  Laterality: Left;  90 mins   TOTAL KNEE ARTHROPLASTY Right 02/27/2018   Procedure: RIGHT TOTAL KNEE ARTHROPLASTY;  Surgeon: Durene Romans, MD;  Location: [facility];  Service: Orthopedics;  Laterality: Right;  Adductor Block    Social History   Tobacco Use  Smoking Status Former   Current packs/day: 0.00   Average packs/day:  1 pack/day for 25.0 years (25.0 ttl pk-yrs)   Types: Cigarettes   Start date: 09/12/1973   Quit date: 09/12/1998   Years since quitting: 25.0  Smokeless Tobacco Never    Social History   Substance and Sexual Activity  Alcohol Use No   Alcohol/week: 0.0 standard drinks of alcohol    Family History  Problem Relation Age of Onset   Diabetes Mother    Stroke Mother    Heart disease Father    Diabetes Father    Arrhythmia Sister    Heart disease Sister        x 2   Breast cancer Sister        x 2   Diabetes Sister        x 2    Review of Systems: As noted in history of present illness. All other systems were reviewed and are negative.  Physical Exam: BP 124/66   Pulse 69   Ht 5\' 4"  (1.626 m)   Wt 214 lb (97.1 kg)   SpO2 98%   BMI 36.73 kg/m  GENERAL:  Well appearing WF in NAD HEENT:  PERRL, EOMI, sclera are clear. Oropharynx is clear. NECK:  No jugular venous distention, carotid upstroke brisk and symmetric, no bruits, no thyromegaly or adenopathy LUNGS:  Clear to auscultation bilaterally CHEST:  Unremarkable HEART:  IRRR,  PMI not displaced or sustained,S1 and S2 within normal limits, no S3, no S4: no clicks, no rubs, no murmurs ABD:  Soft, nontender. BS +, no masses or bruits. No hepatomegaly, no splenomegaly EXT:  2 + pulses throughout, no edema, no cyanosis no clubbing SKIN:  Warm and dry.  No rashes NEURO:  Alert and oriented x 3. Cranial nerves II through XII intact. PSYCH:  Cognitively intact    LABORATORY DATA:  Lab Results  Component Value Date   WBC 15.2 (H) 02/28/2018   HGB 9.5 (L) 02/28/2018   HCT 28.5 (L) 02/28/2018   PLT 163 02/28/2018   GLUCOSE 146 (H) 02/28/2018   ALT 30 02/22/2018   AST 31 02/22/2018   NA 141 02/28/2018   K 4.7 02/28/2018   CL 109 02/28/2018   CREATININE 1.98 (H) 02/28/2018   BUN 72 (H) 02/28/2018   CO2 23 02/28/2018   INR 1.7 (A) 12/25/2018   Labs dated 03/03/16: cholesterol 154, triglycerides 72, LDL 86, HDL 54.   Dated 09/23/16: BUN 59, creatinine 1.5. CMET, TSH, CBC normal.  Dated 03/13/18: cholesterol 141, triglycerides 59, HDL 49, LDL 80.  Hgb 10, creatinine 1.6. ALT and TSH normal. Dated 03/26/19: cholesterol 145, triglycerides 66, HDL 56, LDL 76. Creatinine 1.3. other chemistries and TSH normal.  Dated 10/04/19: creatinine 1.3, otherwise CMET, CBC, TFTs normal Dated 04/03/20: cholesterol 144, triglycerides 63, HDL 59, LDL 72. Creatinine 1.4. otherwise CMET and CBC normal Dated 10/20/20: CMET and TFTs normal. Dated 04/16/21: BUn 35, creatinine 1.3. otherwise CMET and CBC normal. Cholesterol 151, triglycerides 79, HDL 56, LDL 79. TSH 0.3. free T4 1.0.  Dated 04/28/22: cholesterol 135, triglycerides 83, HDL 50, LDL 68. LFTs and potassium normal. TFTs normal.  Dated 05/03/23: cholesterol 126, triglycerides 85, HDL 48, LDL 61. LFTs normal  EKG Interpretation Date/Time:  Tuesday October 10 2023 09:17:30 EST Ventricular Rate:  69 PR Interval:    QRS Duration:  72 QT Interval:  380 QTC Calculation: 407 R Axis:   72  Text Interpretation: Atrial fibrillation Low voltage QRS When compared with ECG of 19-Sep-2017 12:06, T wave amplitude has decreased in Lateral leads Confirmed by [place], Jaye Saal [phone]) on 10/10/2023 9:24:49 AM   Assessment / Plan: 1. Atrial fibrillation. Permanent. Rate is well controlled on no medication. She is on anticoagulation with Coumadin.  [place] vasc score of 3. She is asymptomatic.  2. Morbid obesity. Encouraged  weight loss.   3. Hypercholesterolemia-on pravastatin. Controlled.   4. LE edema. Stable on exam today. Continue lasix daily. Support hose. Low sodium diet.    Follow up in one year

## 2023-10-05 DIAGNOSIS — D6869 Other thrombophilia: Secondary | ICD-10-CM | POA: Diagnosis not present

## 2023-10-05 DIAGNOSIS — Z7901 Long term (current) use of anticoagulants: Secondary | ICD-10-CM | POA: Diagnosis not present

## 2023-10-05 DIAGNOSIS — I4821 Permanent atrial fibrillation: Secondary | ICD-10-CM | POA: Diagnosis not present

## 2023-10-10 ENCOUNTER — Ambulatory Visit: Payer: Medicare HMO | Attending: Cardiology | Admitting: Cardiology

## 2023-10-10 ENCOUNTER — Encounter: Payer: Self-pay | Admitting: Cardiology

## 2023-10-10 VITALS — BP 124/66 | HR 69 | Ht 64.0 in | Wt 214.0 lb

## 2023-10-10 DIAGNOSIS — E7800 Pure hypercholesterolemia, unspecified: Secondary | ICD-10-CM | POA: Diagnosis not present

## 2023-10-10 DIAGNOSIS — I77819 Aortic ectasia, unspecified site: Secondary | ICD-10-CM

## 2023-10-10 DIAGNOSIS — I482 Chronic atrial fibrillation, unspecified: Secondary | ICD-10-CM

## 2023-10-10 DIAGNOSIS — I10 Essential (primary) hypertension: Secondary | ICD-10-CM

## 2023-10-10 NOTE — Patient Instructions (Signed)

## 2023-10-18 DIAGNOSIS — N3946 Mixed incontinence: Secondary | ICD-10-CM | POA: Diagnosis not present

## 2023-10-18 DIAGNOSIS — N3941 Urge incontinence: Secondary | ICD-10-CM | POA: Diagnosis not present

## 2023-10-18 DIAGNOSIS — R350 Frequency of micturition: Secondary | ICD-10-CM | POA: Diagnosis not present

## 2023-10-30 ENCOUNTER — Ambulatory Visit: Payer: Medicare HMO | Admitting: Podiatry

## 2023-10-30 DIAGNOSIS — M79675 Pain in left toe(s): Secondary | ICD-10-CM | POA: Diagnosis not present

## 2023-10-30 DIAGNOSIS — M79674 Pain in right toe(s): Secondary | ICD-10-CM

## 2023-10-30 DIAGNOSIS — D6859 Other primary thrombophilia: Secondary | ICD-10-CM

## 2023-10-30 DIAGNOSIS — B351 Tinea unguium: Secondary | ICD-10-CM | POA: Diagnosis not present

## 2023-10-30 NOTE — Progress Notes (Signed)
  Subjective:  Patient ID: [name] [name], female    DOB: Jul 12, 1946,   MRN: [id number]  No chief complaint on file.   78 y.o. female presents for concern of thickened elongated and painful nails that are difficult to trim. Requesting to have them trimmed today . Patient with history of coagulation defect.   PCP:  Gaspar Garbe, MD    . Denies any other pedal complaints. Denies n/v/f/c.   Past Medical History:  Diagnosis Date   AF (atrial fibrillation) (HCC)    Arthritis    OA   Cholelithiasis    Colon polyps    Diverticulosis    DJD (degenerative joint disease)    Dyslipidemia    Fatty liver disease, nonalcoholic    GERD (gastroesophageal reflux disease)    Gout    Hepatitis A AGE 63   History of hiatal hernia    PT THINKS   Hypertension    Hypothyroidism    Obesity     Objective:  Physical Exam: Vascular: DP/PT pulses 2/4 bilateral. CFT <3 seconds. Absent hair growth on digits. Edema noted to bilateral lower extremities. Xerosis noted bilaterally.  Skin. No lacerations or abrasions bilateral feet. Nails 1-5 bilateral  are thickened discolored and elongated with subungual debris.  Musculoskeletal: MMT 5/5 bilateral lower extremities in DF, PF, Inversion and Eversion. Deceased ROM in DF of ankle joint.  Neurological: Sensation intact to light touch. Protective sensation intact bilateral.     Assessment:   1. Pain due to onychomycosis of toenails of both feet   2. Hypercoagulable state (HCC)      Plan:  Patient was evaluated and treated and all questions answered. -Mechanically debrided all nails 1-5 bilateral using sterile nail nipper and filed with dremel without incident  -Answered all patient questions -Patient to return  in 3 months for at risk foot care -Patient advised to call the office if any problems or questions arise in the meantime.   Louann Sjogren, DPM

## 2023-10-31 DIAGNOSIS — L821 Other seborrheic keratosis: Secondary | ICD-10-CM | POA: Diagnosis not present

## 2023-10-31 DIAGNOSIS — D225 Melanocytic nevi of trunk: Secondary | ICD-10-CM | POA: Diagnosis not present

## 2023-10-31 DIAGNOSIS — L814 Other melanin hyperpigmentation: Secondary | ICD-10-CM | POA: Diagnosis not present

## 2023-10-31 DIAGNOSIS — L900 Lichen sclerosus et atrophicus: Secondary | ICD-10-CM | POA: Diagnosis not present

## 2023-11-07 DIAGNOSIS — D6869 Other thrombophilia: Secondary | ICD-10-CM | POA: Diagnosis not present

## 2023-11-07 DIAGNOSIS — Z7901 Long term (current) use of anticoagulants: Secondary | ICD-10-CM | POA: Diagnosis not present

## 2023-11-07 DIAGNOSIS — I4821 Permanent atrial fibrillation: Secondary | ICD-10-CM | POA: Diagnosis not present

## 2023-11-14 DIAGNOSIS — I129 Hypertensive chronic kidney disease with stage 1 through stage 4 chronic kidney disease, or unspecified chronic kidney disease: Secondary | ICD-10-CM | POA: Diagnosis not present

## 2023-11-14 DIAGNOSIS — E039 Hypothyroidism, unspecified: Secondary | ICD-10-CM | POA: Diagnosis not present

## 2023-11-14 DIAGNOSIS — D6869 Other thrombophilia: Secondary | ICD-10-CM | POA: Diagnosis not present

## 2023-11-14 DIAGNOSIS — N184 Chronic kidney disease, stage 4 (severe): Secondary | ICD-10-CM | POA: Diagnosis not present

## 2023-11-14 DIAGNOSIS — E669 Obesity, unspecified: Secondary | ICD-10-CM | POA: Diagnosis not present

## 2023-11-14 DIAGNOSIS — Z7901 Long term (current) use of anticoagulants: Secondary | ICD-10-CM | POA: Diagnosis not present

## 2023-11-14 DIAGNOSIS — I4821 Permanent atrial fibrillation: Secondary | ICD-10-CM | POA: Diagnosis not present

## 2023-11-14 DIAGNOSIS — D692 Other nonthrombocytopenic purpura: Secondary | ICD-10-CM | POA: Diagnosis not present

## 2023-12-13 DIAGNOSIS — I4821 Permanent atrial fibrillation: Secondary | ICD-10-CM | POA: Diagnosis not present

## 2023-12-13 DIAGNOSIS — D6869 Other thrombophilia: Secondary | ICD-10-CM | POA: Diagnosis not present

## 2023-12-13 DIAGNOSIS — Z7901 Long term (current) use of anticoagulants: Secondary | ICD-10-CM | POA: Diagnosis not present

## 2024-01-03 DIAGNOSIS — Z7901 Long term (current) use of anticoagulants: Secondary | ICD-10-CM | POA: Diagnosis not present

## 2024-01-03 DIAGNOSIS — I4821 Permanent atrial fibrillation: Secondary | ICD-10-CM | POA: Diagnosis not present

## 2024-01-03 DIAGNOSIS — D6869 Other thrombophilia: Secondary | ICD-10-CM | POA: Diagnosis not present

## 2024-01-23 ENCOUNTER — Encounter: Payer: Self-pay | Admitting: Podiatry

## 2024-01-23 ENCOUNTER — Ambulatory Visit: Payer: Medicare HMO | Admitting: Podiatry

## 2024-01-23 VITALS — Ht 64.0 in | Wt 214.0 lb

## 2024-01-23 DIAGNOSIS — D6859 Other primary thrombophilia: Secondary | ICD-10-CM

## 2024-01-23 DIAGNOSIS — M79675 Pain in left toe(s): Secondary | ICD-10-CM

## 2024-01-23 DIAGNOSIS — B351 Tinea unguium: Secondary | ICD-10-CM

## 2024-01-23 DIAGNOSIS — M79674 Pain in right toe(s): Secondary | ICD-10-CM

## 2024-01-28 ENCOUNTER — Encounter: Payer: Self-pay | Admitting: Podiatry

## 2024-01-28 NOTE — Progress Notes (Signed)
  Subjective:  Patient ID: [name] [name], female    DOB: 1945/12/21,  MRN: [id number]  78 y.o. female presents at risk foot care with h/o coagulation defect and painful elongated mycotic toenails 1-5 bilaterally which are tender when wearing enclosed shoe gear. Pain is relieved with periodic professional debridement. Chief Complaint  Patient presents with   Nail Problem    Pt is here for [facility] PCP is Dr Tisovec and LOV was in March.   New problem(s): None   PCP is Tisovec, Kristina Pfeiffer, MD.  Allergies  Allergen Reactions   Bactrim [Sulfamethoxazole-Trimethoprim] Rash    Drug eruption type rash    Adhesive [Tape] Other (See Comments)    Tears skin   Celebrex  [Celecoxib ] Other (See Comments)    Causes nose bleeds   Diphenhydramine  Other (See Comments)   Nsaids Other (See Comments)    Causes afib    Review of Systems: Negative except as noted in the HPI.   Objective:  [name] [name] is a pleasant 78 y.o. female in NAD. AAO x 3.  Vascular Examination: Vascular status intact b/l with palpable pedal pulses. CFT immediate b/l. Pedal hair diminished. Trace edema b/l. No pain with calf compression b/l. Skin temperature gradient WNL b/l. No varicosities noted. No cyanosis or clubbing noted.  Neurological Examination: Sensation grossly intact b/l with 10 gram monofilament. Vibratory sensation intact b/l.  Dermatological Examination: Pedal skin with normal turgor, texture and tone b/l. No open wounds nor interdigital macerations noted. Toenails 1-5 b/l thick, discolored, elongated with subungual debris and pain on dorsal palpation. Minimal hyperkeratosis submet head 5 right foot and left great toe.  Musculoskeletal Examination: Muscle strength 5/5 to b/l LE.  No pain, crepitus noted b/l. No gross pedal deformities. Patient ambulates independently without assistive aids.   Radiographs: None Assessment:   1. Pain due to onychomycosis of toenails of both feet   2. Hypercoagulable state (HCC)      Plan:  Consent given for treatment. Patient examined. All patient's and/or POA's questions/concerns addressed on today's visit. Mycotic toenails 1-5 debrided in length and girth without incident. Continue soft, supportive shoe gear daily. Calluses gently filed with dremel submet head 5 right foot and left great toe as a courtesy. Report any pedal injuries to medical professional. Call office if there are any quesitons/concerns.  Return in about 3 months (around 04/24/2024).  Luella Sager, DPM      [place] LOCATION: [address], [place] [address]                   Office [phone]   [facility] LOCATION: [address] [place], [place] [address] Office [phone]

## 2024-01-29 DIAGNOSIS — Z7901 Long term (current) use of anticoagulants: Secondary | ICD-10-CM | POA: Diagnosis not present

## 2024-01-29 DIAGNOSIS — I4821 Permanent atrial fibrillation: Secondary | ICD-10-CM | POA: Diagnosis not present

## 2024-01-29 DIAGNOSIS — D6869 Other thrombophilia: Secondary | ICD-10-CM | POA: Diagnosis not present

## 2024-03-06 DIAGNOSIS — Z7901 Long term (current) use of anticoagulants: Secondary | ICD-10-CM | POA: Diagnosis not present

## 2024-03-06 DIAGNOSIS — D6869 Other thrombophilia: Secondary | ICD-10-CM | POA: Diagnosis not present

## 2024-03-06 DIAGNOSIS — I4821 Permanent atrial fibrillation: Secondary | ICD-10-CM | POA: Diagnosis not present

## 2024-04-03 DIAGNOSIS — Z7901 Long term (current) use of anticoagulants: Secondary | ICD-10-CM | POA: Diagnosis not present

## 2024-04-03 DIAGNOSIS — D6869 Other thrombophilia: Secondary | ICD-10-CM | POA: Diagnosis not present

## 2024-04-03 DIAGNOSIS — I4821 Permanent atrial fibrillation: Secondary | ICD-10-CM | POA: Diagnosis not present

## 2024-04-15 DIAGNOSIS — Z1231 Encounter for screening mammogram for malignant neoplasm of breast: Secondary | ICD-10-CM | POA: Diagnosis not present

## 2024-05-01 ENCOUNTER — Ambulatory Visit: Admitting: Podiatry

## 2024-05-01 DIAGNOSIS — M79675 Pain in left toe(s): Secondary | ICD-10-CM | POA: Diagnosis not present

## 2024-05-01 DIAGNOSIS — M79674 Pain in right toe(s): Secondary | ICD-10-CM

## 2024-05-01 DIAGNOSIS — B351 Tinea unguium: Secondary | ICD-10-CM

## 2024-05-01 DIAGNOSIS — D6859 Other primary thrombophilia: Secondary | ICD-10-CM | POA: Diagnosis not present

## 2024-05-06 ENCOUNTER — Encounter: Payer: Self-pay | Admitting: Podiatry

## 2024-05-06 NOTE — Progress Notes (Signed)
  Subjective:  Patient ID: [name], female    DOB: 1946-03-01,  MRN: [id number]  DANAIYA STEADMAN presents to clinic today for at risk foot care with h/o coagulation defect and painful thick toenails that are difficult to trim. Pain interferes with ambulation. Aggravating factors include wearing enclosed shoe gear. Pain is relieved with periodic professional debridement.  Chief Complaint  Patient presents with   RFC    RFC Non diabetic toenail trim. LOV with PCP 12/2023   New problem(s): None.   PCP is Tisovec, Charlie ORN, MD.  Allergies  Allergen Reactions   Bactrim [Sulfamethoxazole-Trimethoprim] Rash    Drug eruption type rash    Adhesive [Tape] Other (See Comments)    Tears skin   Celebrex  [Celecoxib ] Other (See Comments)    Causes nose bleeds   Diphenhydramine  Other (See Comments)   Nsaids Other (See Comments)    Causes afib    Review of Systems: Negative except as noted in the HPI.  Objective: No changes noted in today's physical examination. There were no vitals filed for this visit. RILDA BULLS is a pleasant 78 y.o. female in NAD. AAO x 3.  Vascular Examination: Vascular status intact b/l with palpable pedal pulses. CFT immediate b/l. Pedal hair diminished. Trace edema b/l. No pain with calf compression b/l. Skin temperature gradient WNL b/l. No varicosities noted. No cyanosis or clubbing noted.  Neurological Examination: Sensation grossly intact b/l with 10 gram monofilament. Vibratory sensation intact b/l.  Dermatological Examination: Pedal skin with normal turgor, texture and tone b/l. No open wounds nor interdigital macerations noted. Toenails 1-5 b/l thick, discolored, elongated with subungual debris and pain on dorsal palpation. Minimal hyperkeratosis submet head 5 right foot and left great toe.  Musculoskeletal Examination: Muscle strength 5/5 to b/l LE.  No pain, crepitus noted b/l. No gross pedal deformities. Patient ambulates independently without  assistive aids.   Radiographs: None  Assessment/Plan: 1. Pain due to onychomycosis of toenails of both feet   2. Hypercoagulable state [facility])   Patient was evaluated and treated. All patient's and/or POA's questions/concerns addressed on today's visit. Toenails 1-5 debrided in length and girth without incident. Continue soft, supportive shoe gear daily. Report any pedal injuries to medical professional. Call office if there are any questions/concerns. -Patient/POA to call should there be question/concern in the interim.   Return in about 3 months (around 08/01/2024).  Delon LITTIE Merlin, DPM      [place] LOCATION: [address], [place] [address]                   Office [phone]   [facility] LOCATION: [address] [place], [place] [address] Office [phone]

## 2024-05-10 DIAGNOSIS — I129 Hypertensive chronic kidney disease with stage 1 through stage 4 chronic kidney disease, or unspecified chronic kidney disease: Secondary | ICD-10-CM | POA: Diagnosis not present

## 2024-05-10 DIAGNOSIS — M109 Gout, unspecified: Secondary | ICD-10-CM | POA: Diagnosis not present

## 2024-05-10 DIAGNOSIS — E7800 Pure hypercholesterolemia, unspecified: Secondary | ICD-10-CM | POA: Diagnosis not present

## 2024-05-10 DIAGNOSIS — E039 Hypothyroidism, unspecified: Secondary | ICD-10-CM | POA: Diagnosis not present

## 2024-05-16 DIAGNOSIS — Z1339 Encounter for screening examination for other mental health and behavioral disorders: Secondary | ICD-10-CM | POA: Diagnosis not present

## 2024-05-16 DIAGNOSIS — Z0000 Encounter for general adult medical examination without abnormal findings: Secondary | ICD-10-CM | POA: Diagnosis not present

## 2024-05-16 DIAGNOSIS — Z1331 Encounter for screening for depression: Secondary | ICD-10-CM | POA: Diagnosis not present

## 2024-05-16 DIAGNOSIS — Z7901 Long term (current) use of anticoagulants: Secondary | ICD-10-CM | POA: Diagnosis not present

## 2024-05-16 DIAGNOSIS — I129 Hypertensive chronic kidney disease with stage 1 through stage 4 chronic kidney disease, or unspecified chronic kidney disease: Secondary | ICD-10-CM | POA: Diagnosis not present

## 2024-05-16 DIAGNOSIS — E039 Hypothyroidism, unspecified: Secondary | ICD-10-CM | POA: Diagnosis not present

## 2024-05-16 DIAGNOSIS — I4821 Permanent atrial fibrillation: Secondary | ICD-10-CM | POA: Diagnosis not present

## 2024-05-16 DIAGNOSIS — Z8616 Personal history of COVID-19: Secondary | ICD-10-CM | POA: Diagnosis not present

## 2024-05-16 DIAGNOSIS — M109 Gout, unspecified: Secondary | ICD-10-CM | POA: Diagnosis not present

## 2024-05-16 DIAGNOSIS — E669 Obesity, unspecified: Secondary | ICD-10-CM | POA: Diagnosis not present

## 2024-05-16 DIAGNOSIS — N184 Chronic kidney disease, stage 4 (severe): Secondary | ICD-10-CM | POA: Diagnosis not present

## 2024-05-16 DIAGNOSIS — R82998 Other abnormal findings in urine: Secondary | ICD-10-CM | POA: Diagnosis not present

## 2024-05-16 DIAGNOSIS — D692 Other nonthrombocytopenic purpura: Secondary | ICD-10-CM | POA: Diagnosis not present

## 2024-05-16 DIAGNOSIS — D6869 Other thrombophilia: Secondary | ICD-10-CM | POA: Diagnosis not present

## 2024-06-06 DIAGNOSIS — Z23 Encounter for immunization: Secondary | ICD-10-CM | POA: Diagnosis not present

## 2024-06-11 DIAGNOSIS — D6869 Other thrombophilia: Secondary | ICD-10-CM | POA: Diagnosis not present

## 2024-06-11 DIAGNOSIS — I4821 Permanent atrial fibrillation: Secondary | ICD-10-CM | POA: Diagnosis not present

## 2024-06-11 DIAGNOSIS — Z7901 Long term (current) use of anticoagulants: Secondary | ICD-10-CM | POA: Diagnosis not present

## 2024-06-27 DIAGNOSIS — M25561 Pain in right knee: Secondary | ICD-10-CM | POA: Diagnosis not present

## 2024-06-27 DIAGNOSIS — M5137 Other intervertebral disc degeneration, lumbosacral region with discogenic back pain only: Secondary | ICD-10-CM | POA: Diagnosis not present

## 2024-06-27 DIAGNOSIS — M25562 Pain in left knee: Secondary | ICD-10-CM | POA: Diagnosis not present

## 2024-06-27 DIAGNOSIS — Z96653 Presence of artificial knee joint, bilateral: Secondary | ICD-10-CM | POA: Diagnosis not present

## 2024-06-28 DIAGNOSIS — H524 Presbyopia: Secondary | ICD-10-CM | POA: Diagnosis not present

## 2024-06-28 DIAGNOSIS — H353131 Nonexudative age-related macular degeneration, bilateral, early dry stage: Secondary | ICD-10-CM | POA: Diagnosis not present

## 2024-06-28 DIAGNOSIS — Z961 Presence of intraocular lens: Secondary | ICD-10-CM | POA: Diagnosis not present

## 2024-07-04 DIAGNOSIS — D6869 Other thrombophilia: Secondary | ICD-10-CM | POA: Diagnosis not present

## 2024-07-04 DIAGNOSIS — I4821 Permanent atrial fibrillation: Secondary | ICD-10-CM | POA: Diagnosis not present

## 2024-07-04 DIAGNOSIS — Z7901 Long term (current) use of anticoagulants: Secondary | ICD-10-CM | POA: Diagnosis not present

## 2024-08-06 DIAGNOSIS — Z7901 Long term (current) use of anticoagulants: Secondary | ICD-10-CM | POA: Diagnosis not present

## 2024-08-06 DIAGNOSIS — I4821 Permanent atrial fibrillation: Secondary | ICD-10-CM | POA: Diagnosis not present

## 2024-08-06 DIAGNOSIS — D6869 Other thrombophilia: Secondary | ICD-10-CM | POA: Diagnosis not present

## 2024-08-21 ENCOUNTER — Encounter: Payer: Self-pay | Admitting: Podiatry

## 2024-08-21 ENCOUNTER — Ambulatory Visit: Admitting: Podiatry

## 2024-08-21 DIAGNOSIS — M79675 Pain in left toe(s): Secondary | ICD-10-CM

## 2024-08-21 DIAGNOSIS — M79674 Pain in right toe(s): Secondary | ICD-10-CM

## 2024-08-21 DIAGNOSIS — D6859 Other primary thrombophilia: Secondary | ICD-10-CM | POA: Diagnosis not present

## 2024-08-21 DIAGNOSIS — B351 Tinea unguium: Secondary | ICD-10-CM

## 2024-08-30 NOTE — Progress Notes (Signed)
"  °  Subjective:  Patient ID: [name] [name], female    DOB: 1946-04-10,  MRN: [id number]  [name] [name] presents to clinic today for at risk foot care with h/o coagulation defect and painful elongated mycotic toenails 1-5 bilaterally which are tender when wearing enclosed shoe gear. Pain is relieved with periodic professional debridement. She is accompanied by her husband on today's visit. Chief Complaint  Patient presents with   RFC    Rm17 Routine foot care/ Dr. Charlie Reas   New problem(s): None.   PCP is Tisovec, Charlie ORN, MD.  Allergies[1]  Review of Systems: Negative except as noted in the HPI.  Objective: No changes noted in today's physical examination. There were no vitals filed for this visit. [name] [name] is a pleasant 78 y.o. female in NAD. AAO x 3.  Vascular Examination: Vascular status intact b/l with palpable pedal pulses. CFT immediate b/l. Pedal hair diminished. Trace edema b/l. No pain with calf compression b/l. Skin temperature gradient WNL b/l. No varicosities noted. No cyanosis or clubbing noted.  Neurological Examination: Sensation grossly intact b/l with 10 gram monofilament. Vibratory sensation intact b/l.  Dermatological Examination: Pedal skin with normal turgor, texture and tone b/l. No open wounds nor interdigital macerations noted. Toenails 1-5 b/l thick, discolored, elongated with subungual debris and pain on dorsal palpation. Minimal hyperkeratosis submet head 5 right foot and left great toe.  Musculoskeletal Examination: Muscle strength 5/5 to b/l LE.  No pain, crepitus noted b/l. No gross pedal deformities. Patient ambulates independently without assistive aids.   Radiographs: None  Assessment/Plan: 1. Pain due to onychomycosis of toenails of both feet   2. Hypercoagulable state   Consent given for treatment. Patient examined. All patient's and/or POA's questions/concerns addressed on today's visit. Mycotic toenails 1-5 b/l debrided in length  and girth without incident. Continue soft, supportive shoe gear daily. Report any pedal injuries to medical professional. Call office if there are any quesitons/concerns. -Patient/POA to call should there be question/concern in the interim.   Return in about 3 months (around 11/19/2024).  [name] [name], DPM      [place] LOCATION: [address] [place] [address]                   Office [phone]   [facility] LOCATION: [address] [place], [place] [address] Office [phone]     [1]  Allergies Allergen Reactions   Bactrim [Sulfamethoxazole-Trimethoprim] Rash    Drug eruption type rash    Adhesive [Tape] Other (See Comments)    Tears skin   Celebrex  [Celecoxib ] Other (See Comments)    Causes nose bleeds   Diphenhydramine  Other (See Comments)   Nsaids Other (See Comments)    Causes afib   "

## 2024-10-09 NOTE — Progress Notes (Unsigned)
 "  Rock [name] [name] Date of Birth: [birth year]   History of Present Illness: [name] [name] is seen for  followup of atrial fibrillation. She has a history of permanent atrial fibrillation treated with  anticoagulation with Coumadin . No need for rate control therapy since HR has been normal. Prior Holter monitor showed average HR 72. Range 38-139. Longest pause 2.5 sec.    In August 2017 she had ERCP for common duct stone. In November 2017 she had colonoscopy with findings of three polyps. In June 2019 she had left TKR then in June 2019 right TKR.  She was seen in April 2019 after 2 syncopal episodes. She was found to have orthostatic hypotension and lisinopril HCT was stopped. On follow up she has had no further syncope.  On follow up today she is doing well. She joined [employer] this summer and has lost 20 lbs.  She denies any dizziness, syncope, dyspnea, chest pain. No change in ankle swelling.  Current Outpatient Medications on File Prior to Visit  Medication Sig Dispense Refill   acetaminophen  (TYLENOL ) 650 MG CR tablet Tylenol      allopurinol  (ZYLOPRIM ) 300 MG tablet Take 1 tablet by mouth daily.     COENZYME Q10 PO Take 1 capsule by mouth daily.      fexofenadine (ALLEGRA) 180 MG tablet Take 180 mg by mouth daily.      furosemide  (LASIX ) 20 MG tablet Take 40 mg by mouth daily.     levothyroxine  (SYNTHROID ) 100 MCG tablet Take 1 tablet by mouth daily.     methocarbamol  (ROBAXIN ) 500 MG tablet Take 1 tablet by mouth 2 (two) times daily.     olmesartan (BENICAR) 20 MG tablet Take 20 mg by mouth daily.     omeprazole  (PRILOSEC) 20 MG capsule Take 1 capsule by mouth daily.     pravastatin  (PRAVACHOL ) 40 MG tablet Take 1 tablet by mouth daily.     traMADol (ULTRAM) 50 MG tablet Take 50 mg by mouth as needed.      warfarin (COUMADIN ) 5 MG tablet Take 2.5-5 mg by mouth See admin instructions. Take 5 mg by mouth daily at night on Monday and Friday. Take 2.5 mg by mouth daily at night on all  other days     No current facility-administered medications on file prior to visit.    Allergies  Allergen Reactions   Bactrim [Sulfamethoxazole-Trimethoprim] Rash    Drug eruption type rash    Adhesive [Tape] Other (See Comments)    Tears skin   Celebrex  [Celecoxib ] Other (See Comments)    Causes nose bleeds   Diphenhydramine  Other (See Comments)   Nsaids Other (See Comments)    Causes afib    Past Medical History:  Diagnosis Date   AF (atrial fibrillation) (HCC)    Arthritis    OA   Cholelithiasis    Colon polyps    Diverticulosis    DJD (degenerative joint disease)    Dyslipidemia    Fatty liver disease, nonalcoholic    GERD (gastroesophageal reflux disease)    Gout    Hepatitis A AGE 79   History of hiatal hernia    PT THINKS   Hypertension    Hypothyroidism    Obesity     Past Surgical History:  Procedure Laterality Date   BREAST BIOPSY Bilateral    1 right, 2 left   CHOLECYSTECTOMY  22 YRS AGO   CYST ON CERVIX REMOVED  1965   ERCP  20 YRS AGO  ERCP N/A 04/12/2016   Procedure: ENDOSCOPIC RETROGRADE CHOLANGIOPANCREATOGRAPHY (ERCP);  Surgeon: Norleen LOISE Kiang, MD;  Location: [facility];  Service: Endoscopy;  Laterality: N/A;   EYE SURGERY Bilateral 2015   LASER SURGERY DONE ALSO BOTH EYES FOR FILM OVER EYES   KNEE ARTHROSCOPY 20 YRS AGO Left    PLANTAR FASCIA SURGERY Left    TOENAIL EXCISION Bilateral    TONSILLECTOMY     TOTAL ABDOMINAL HYSTERECTOMY     with appendectomy, COMPLETE   TOTAL KNEE ARTHROPLASTY Left 09/25/2017   Procedure: LEFT TOTAL KNEE ARTHROPLASTY;  Surgeon: Ernie Cough, MD;  Location: [facility];  Service: Orthopedics;  Laterality: Left;  90 mins   TOTAL KNEE ARTHROPLASTY Right 02/27/2018   Procedure: RIGHT TOTAL KNEE ARTHROPLASTY;  Surgeon: Ernie Cough, MD;  Location: [facility];  Service: Orthopedics;  Laterality: Right;  Adductor Block    Social History   Tobacco Use  Smoking Status Former   Current packs/day: 0.00   Average packs/day:  1 pack/day for 25.0 years (25.0 ttl pk-yrs)   Types: Cigarettes   Start date: 09/12/1973   Quit date: 09/12/1998   Years since quitting: 26.0  Smokeless Tobacco Never    Social History   Substance and Sexual Activity  Alcohol  Use No   Alcohol /week: 0.0 standard drinks of alcohol     Family History  Problem Relation Age of Onset   Diabetes Mother    Stroke Mother    Heart disease Father    Diabetes Father    Arrhythmia Sister    Heart disease Sister        x 2   Breast cancer Sister        x 2   Diabetes Sister        x 2    Review of Systems: As noted in history of present illness. All other systems were reviewed and are negative.  Physical Exam: There were no vitals taken for this visit. GENERAL:  Well appearing WF in NAD HEENT:  PERRL, EOMI, sclera are clear. Oropharynx is clear. NECK:  No jugular venous distention, carotid upstroke brisk and symmetric, no bruits, no thyromegaly or adenopathy LUNGS:  Clear to auscultation bilaterally CHEST:  Unremarkable HEART:  IRRR,  PMI not displaced or sustained,S1 and S2 within normal limits, no S3, no S4: no clicks, no rubs, no murmurs ABD:  Soft, nontender. BS +, no masses or bruits. No hepatomegaly, no splenomegaly EXT:  2 + pulses throughout, no edema, no cyanosis no clubbing SKIN:  Warm and dry.  No rashes NEURO:  Alert and oriented x 3. Cranial nerves II through XII intact. PSYCH:  Cognitively intact    LABORATORY DATA:  Lab Results  Component Value Date   WBC 15.2 (H) 02/28/2018   HGB 9.5 (L) 02/28/2018   HCT 28.5 (L) 02/28/2018   PLT 163 02/28/2018   GLUCOSE 146 (H) 02/28/2018   ALT 30 02/22/2018   AST 31 02/22/2018   NA 141 02/28/2018   K 4.7 02/28/2018   CL 109 02/28/2018   CREATININE 1.98 (H) 02/28/2018   BUN 72 (H) 02/28/2018   CO2 23 02/28/2018   INR 1.7 (A) 12/25/2018   Labs dated 03/03/16: cholesterol 154, triglycerides 72, LDL 86, HDL 54.  Dated 09/23/16: BUN 59, creatinine 1.5. CMET, TSH, CBC  normal.  Dated 03/13/18: cholesterol 141, triglycerides 59, HDL 49, LDL 80. Hgb 10, creatinine 1.6. ALT and TSH normal. Dated 03/26/19: cholesterol 145, triglycerides 66, HDL 56, LDL 76. Creatinine 1.3. other chemistries  and TSH normal.  Dated 10/04/19: creatinine 1.3, otherwise CMET, CBC, TFTs normal Dated 04/03/20: cholesterol 144, triglycerides 63, HDL 59, LDL 72. Creatinine 1.4. otherwise CMET and CBC normal Dated 10/20/20: CMET and TFTs normal. Dated 04/16/21: BUn 35, creatinine 1.3. otherwise CMET and CBC normal. Cholesterol 151, triglycerides 79, HDL 56, LDL 79. TSH 0.3. free T4 1.0.  Dated 04/28/22: cholesterol 135, triglycerides 83, HDL 50, LDL 68. LFTs and potassium normal. TFTs normal.  Dated 05/03/23: cholesterol 126, triglycerides 85, HDL 48, LDL 61. LFTs normal      Assessment / Plan: 1. Atrial fibrillation. Permanent. Rate is well controlled on no medication. She is on anticoagulation with Coumadin .  CHAD vasc score of 3. She is asymptomatic.  2. Morbid obesity. Encouraged  weight loss.   3. Hypercholesterolemia-on pravastatin . Controlled.   4. LE edema. Stable on exam today. Continue lasix  daily. Support hose. Low sodium diet.    Follow up in one year "

## 2024-10-15 ENCOUNTER — Ambulatory Visit: Admitting: Cardiology

## 2024-11-13 ENCOUNTER — Ambulatory Visit: Admitting: Cardiology

## 2024-12-04 ENCOUNTER — Ambulatory Visit: Admitting: Podiatry
